# Patient Record
Sex: Female | Born: 1995 | Race: Black or African American | Hispanic: No | Marital: Single | Smoking: Current every day smoker
Health system: Southern US, Community
[De-identification: ages and names within clinical notes are randomized; demographics above are authoritative.]

## PROBLEM LIST (undated history)

## (undated) DIAGNOSIS — J45909 Unspecified asthma, uncomplicated: Secondary | ICD-10-CM

## (undated) DIAGNOSIS — E049 Nontoxic goiter, unspecified: Secondary | ICD-10-CM

## (undated) DIAGNOSIS — R7303 Prediabetes: Secondary | ICD-10-CM

## (undated) DIAGNOSIS — F209 Schizophrenia, unspecified: Secondary | ICD-10-CM

## (undated) DIAGNOSIS — T7840XA Allergy, unspecified, initial encounter: Secondary | ICD-10-CM

## (undated) DIAGNOSIS — E063 Autoimmune thyroiditis: Secondary | ICD-10-CM

## (undated) DIAGNOSIS — L83 Acanthosis nigricans: Secondary | ICD-10-CM

## (undated) DIAGNOSIS — E663 Overweight: Secondary | ICD-10-CM

## (undated) HISTORY — PX: MOUTH SURGERY: SHX715

## (undated) HISTORY — PX: NO PAST SURGERIES: SHX2092

## (undated) HISTORY — DX: Overweight: E66.3

## (undated) HISTORY — DX: Prediabetes: R73.03

## (undated) HISTORY — DX: Autoimmune thyroiditis: E06.3

## (undated) HISTORY — DX: Nontoxic goiter, unspecified: E04.9

## (undated) HISTORY — DX: Acanthosis nigricans: L83

---
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
## 2018-02-04 ENCOUNTER — Encounter (HOSPITAL_COMMUNITY): Payer: Self-pay | Admitting: Emergency Medicine

## 2018-02-04 ENCOUNTER — Other Ambulatory Visit: Payer: Self-pay

## 2018-02-04 ENCOUNTER — Ambulatory Visit (HOSPITAL_COMMUNITY)
Admission: EM | Admit: 2018-02-04 | Discharge: 2018-02-04 | Disposition: A | Discharge status: Home / Self Care | Payer: Medicaid Other | Attending: Family Medicine | Admitting: Family Medicine

## 2018-02-04 DIAGNOSIS — Z711 Person with feared health complaint in whom no diagnosis is made: Secondary | ICD-10-CM | POA: Diagnosis not present

## 2018-02-04 DIAGNOSIS — Z3202 Encounter for pregnancy test, result negative: Secondary | ICD-10-CM | POA: Diagnosis not present

## 2018-02-04 DIAGNOSIS — T192XXA Foreign body in vulva and vagina, initial encounter: Secondary | ICD-10-CM | POA: Insufficient documentation

## 2018-02-04 LAB — POCT URINALYSIS DIP (DEVICE)
Bilirubin Urine: NEGATIVE
Glucose, UA: NEGATIVE mg/dL
Hgb urine dipstick: NEGATIVE
Ketones, ur: NEGATIVE mg/dL
Leukocytes, UA: NEGATIVE
Nitrite: NEGATIVE
PH: 6.5 (ref 5.0–8.0)
Protein, ur: NEGATIVE mg/dL
Specific Gravity, Urine: 1.02 (ref 1.005–1.030)
Urobilinogen, UA: 0.2 mg/dL (ref 0.0–1.0)

## 2018-02-04 LAB — POCT PREGNANCY, URINE: PREG TEST UR: NEGATIVE

## 2018-02-04 NOTE — ED Triage Notes (Signed)
Patient says she feels like she may have left a tampon in vagina, patient says this based on how abdomen feels, not remembering leaving a tampon inserted.    Patient reports no nausea.  Reports vaginal discharge, denies vaginal odor.  Patient is difficult historian, non-specific, vague

## 2018-02-04 NOTE — ED Notes (Signed)
Instructed to put on a gown 

## 2018-02-04 NOTE — ED Provider Notes (Addendum)
[facility]    CSN: [id number] Arrival date & time: 02/04/18  1005     History   Chief Complaint Chief Complaint  Patient presents with  . Foreign Body in Vagina    HPI Heather Kramer is a 23 y.o. female.   23 year old female presents with concerns of foreign body in her cervix x1 year.  Patient states that she has intermittent "grumbling without the sound`" in her stomach and is concerned that she left a tampon in her approximately 1 year.  Patient denies any issues with her menstruation no pain with urination fevers abdominal pain or flank pain.  Patient about getting a pelvic exam at this time.  Pelvic exam performed shows no foreign object.  Patient is a poor historian and vague with her symptoms.  Patient states that she is concerned that the tampon may have moved into her stomach due to the length of time which it has been in her body.  Patient educated on anatomy.  Patient declines GI work-up at this time.       Past Medical History:  Diagnosis Date  . Acquired acanthosis nigricans   . Goiter   . Goiter   . Overweight(278.02)   . Pre-diabetes   . Thyroiditis, autoimmune     Patient Active Problem List   Diagnosis Date Noted  . Goiter   . Thyroiditis, autoimmune   . Acquired acanthosis nigricans   . 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 Goiter, unspecified 06/26/2010  . Pre-diabetes 06/26/2010    Past Surgical History:  Procedure Laterality Date  . NO PAST SURGERIES      OB History   No obstetric history on file.      Home Medications    Prior to Admission medications   Medication Sig Start Date End Date Taking? Authorizing Provider  albuterol (PROVENTIL) (2.5 MG/3ML) 0.083% nebulizer solution Take 2.5 mg by nebulization as needed.    Yes [provider]  fluticasone (FLONASE) 50 MCG/ACT nasal spray Place 2 sprays into the nose daily.   Yes [provider]  cetirizine (ZYRTEC) 10 MG tablet Take 10 mg by mouth  daily.    [provider]  traMADol (ULTRAM) 50 MG tablet Take 1 tablet (50 mg total) by mouth every 6 (six) hours as needed for moderate pain. 06/20/16   Fayrene Helper, PA-C  Vitamin D, Ergocalciferol, (DRISDOL) 50000 UNITS CAPS capsule Take 1 capsule (50,000 Units total) by mouth every 7 (seven) days. 02/21/14   Verneda Skill, FNP    Family History Family History  Problem Relation Age of Onset  . Obesity Mother   . Heart disease Father   . Cancer Maternal Grandmother   . Diabetes Maternal Grandfather     Social History Social History   Tobacco Use  . Smoking status: Never Smoker  . Smokeless tobacco: Never Used  Substance Use Topics  . Alcohol use: No  . Drug use: No     Allergies   Patient has no known allergies.   Review of Systems Review of Systems  Constitutional: Negative for chills and fever.  HENT: Negative for ear pain and sore throat.   Eyes: Negative for pain and visual disturbance.  Respiratory: Negative for cough and shortness of breath.   Cardiovascular: Negative for chest pain and palpitations.  Gastrointestinal: Negative for abdominal pain and vomiting.       Gurgling noises.  Genitourinary: Negative for dysuria and hematuria.  Musculoskeletal: Negative for arthralgias and back pain.  Skin: Negative for color change and rash.  Neurological: Negative for seizures and syncope.  All other systems reviewed and are negative.    Physical Exam Triage Vital Signs ED Triage Vitals  Enc Vitals Group     BP 02/04/18 1039 100/80     Pulse Rate 02/04/18 1039 89     Resp 02/04/18 1039 18     Temp 02/04/18 1039 98.6 F (37 C)     Temp Source 02/04/18 1039 Oral     SpO2 02/04/18 1039 100 %     Weight --      Height --      Head Circumference --      Peak Flow --      Pain Score 02/04/18 1036 6     Pain Loc --      Pain Edu? --      Excl. in GC? --    No data found.  Updated Vital Signs BP (!) 105/47 (BP Location: Left Arm)   Pulse 85    Temp 98.6 F (37 C) (Oral)   Resp 18   LMP 01/14/2018   SpO2 100%   Visual Acuity Right Eye Distance:   Left Eye Distance:   Bilateral Distance:    Right Eye Near:   Left Eye Near:    Bilateral Near:     Physical Exam Vitals signs and nursing note reviewed.  Constitutional:      Appearance: She is well-developed.  HENT:     Head: Normocephalic and atraumatic.  Eyes:     Conjunctiva/sclera: Conjunctivae normal.  Neck:     Musculoskeletal: Normal range of motion.  Pulmonary:     Effort: Pulmonary effort is normal.  Musculoskeletal: Normal range of motion.  Skin:    General: Skin is warm.  Neurological:     Mental Status: She is alert and oriented to person, place, and time.      UC Treatments / Results  Labs (all labs ordered are listed, but only abnormal results are displayed) Labs Reviewed  POCT URINALYSIS DIP (DEVICE)  POCT PREGNANCY, URINE    EKG None  Radiology No results found.  Procedures Procedures (including critical care time)  Medications Ordered in UC Medications - No data to display  Initial Impression / Assessment and Plan / UC Course  I have reviewed the triage vital signs and the nursing notes.  Pertinent labs & imaging results that were available during my care of the patient were reviewed by me and considered in my medical decision making (see chart for details).      Final Clinical Impressions(s) / UC Diagnoses   Final diagnoses:  None   Discharge Instructions   None    ED Prescriptions    None     Controlled Substance Prescriptions [place] Controlled Substance Registry consulted? Not Applicable   Alene MiresOmohundro, Wagner Tanzi C, NP 02/04/18 1112    Alene Miresmohundro, Rori Goar C, NP 02/04/18 1118    Alene Miresmohundro, Latavia Goga C, NP 02/04/18 1121

## 2018-02-05 ENCOUNTER — Other Ambulatory Visit: Payer: Self-pay

## 2018-02-05 ENCOUNTER — Emergency Department (HOSPITAL_COMMUNITY)
Admission: EM | Admit: 2018-02-05 | Discharge: 2018-02-06 | Disposition: A | Discharge status: Home / Self Care | Payer: Medicaid Other | Attending: Emergency Medicine | Admitting: Emergency Medicine

## 2018-02-05 ENCOUNTER — Encounter (HOSPITAL_COMMUNITY): Payer: Self-pay | Admitting: Emergency Medicine

## 2018-02-05 DIAGNOSIS — Z79899 Other long term (current) drug therapy: Secondary | ICD-10-CM | POA: Insufficient documentation

## 2018-02-05 DIAGNOSIS — F17200 Nicotine dependence, unspecified, uncomplicated: Secondary | ICD-10-CM | POA: Diagnosis not present

## 2018-02-05 DIAGNOSIS — F12259 Cannabis dependence with psychotic disorder, unspecified: Secondary | ICD-10-CM | POA: Insufficient documentation

## 2018-02-05 DIAGNOSIS — R440 Auditory hallucinations: Secondary | ICD-10-CM | POA: Diagnosis present

## 2018-02-05 DIAGNOSIS — F1210 Cannabis abuse, uncomplicated: Secondary | ICD-10-CM

## 2018-02-05 HISTORY — DX: Allergy, unspecified, initial encounter: T78.40XA

## 2018-02-05 LAB — COMPREHENSIVE METABOLIC PANEL
ALBUMIN: 3.9 g/dL (ref 3.5–5.0)
ALT: 14 U/L (ref 0–44)
ANION GAP: 8 (ref 5–15)
AST: 17 U/L (ref 15–41)
Alkaline Phosphatase: 44 U/L (ref 38–126)
BUN: 13 mg/dL (ref 6–20)
CHLORIDE: 108 mmol/L (ref 98–111)
CO2: 23 mmol/L (ref 22–32)
Calcium: 9 mg/dL (ref 8.9–10.3)
Creatinine, Ser: 0.95 mg/dL (ref 0.44–1.00)
GFR calc Af Amer: >60 mL/min (ref >60)
GFR calc non Af Amer: >60 mL/min (ref >60)
GLUCOSE: 103 mg/dL — AB (ref 70–99)
POTASSIUM: 4.1 mmol/L (ref 3.5–5.1)
SODIUM: 139 mmol/L (ref 135–145)
Total Bilirubin: 0.6 mg/dL (ref 0.3–1.2)
Total Protein: 6.9 g/dL (ref 6.5–8.1)

## 2018-02-05 LAB — CBC
HCT: 39.4 % (ref 36.0–46.0)
HEMOGLOBIN: 13.1 g/dL (ref 12.0–15.0)
MCH: 29.7 pg (ref 26.0–34.0)
MCHC: 33.2 g/dL (ref 30.0–36.0)
MCV: 89.3 fL (ref 80.0–100.0)
NRBC: 0 % (ref 0.0–0.2)
Platelets: 286 10*3/uL (ref 150–400)
RBC: 4.41 MIL/uL (ref 3.87–5.11)
RDW: 12.4 % (ref 11.5–15.5)
WBC: 6.9 10*3/uL (ref 4.0–10.5)

## 2018-02-05 LAB — I-STAT BETA HCG BLOOD, ED (MC, WL, AP ONLY): I-stat hCG, quantitative: <5 m[IU]/mL (ref <5)

## 2018-02-05 LAB — TSH: TSH: 0.433 u[IU]/mL (ref 0.350–4.500)

## 2018-02-05 LAB — SALICYLATE LEVEL: Salicylate Lvl: <7 mg/dL (ref 2.8–30.0)

## 2018-02-05 LAB — RAPID URINE DRUG SCREEN, HOSP PERFORMED
Amphetamines: NOT DETECTED
BENZODIAZEPINES: NOT DETECTED
Barbiturates: NOT DETECTED
COCAINE: NOT DETECTED
OPIATES: NOT DETECTED
TETRAHYDROCANNABINOL: POSITIVE — AB

## 2018-02-05 LAB — ACETAMINOPHEN LEVEL

## 2018-02-05 LAB — ETHANOL: Alcohol, Ethyl (B): <10 mg/dL (ref <10)

## 2018-02-05 NOTE — BH Assessment (Signed)
Tele Assessment Note   Patient Name: Heather Kramer MRN: [id number] Referring Physician: Dr. Rubin Payor  Location of Patient: [facility]  Location of Provider: [facility]  Heather Kramer is an 23 y.o., single female. Pt presented to [facility] voluntarily and unaccompanied. Pt reports that her aunt and aunt's boyfriend transported her to the hospital due to her experiencing auditory and visual hallucinations. Pt stated, "I hear the voice, because your phone off." Pt was the only historian and pt's communication was mostly incoherent. Pt did answer most questions, but did not answer them appropriately to match what was asked. Pt was vague about her symptoms but denied depression and anxiety. Pt denied hx of trauma. Pt denied current MH medications and therapy. Pt denied SI/HI. Pt reports smoking .5-2 grams of marijuana daily. Pt reports that her mother kicked out of her home today due to her marijauna use. Pt stated that she cannot return.   Pt reports being single and having no children. Pt reports being unemployed and having no income. Pt reports no major medical issues, but discussed cutting her finger at her previous job and feeling numbness in it when it gets cold. Pt reports no legal involvement or probation. Pt reports that her aunt is a support.   Pt oriented to person, place, time and situation. Pt presented alert, dressed appropriately and decently groomed. Pt spoke clearly, but incoherently. Pt did seem to be under the influence of any substances. Pt made decent eye contact. Pt did not answer questions appropriately, and often times went on tangents about unrelated topics. Pt presented silly, but calm and open to the assessment process. Pt presented with no impairments of remote or recent memory that could be detected. Pt seems to be responding to some king of internal stimuli as evidenced by her showing her teeth and head movements. Pt confirmed some kind of hallucination, but it was not  clear due to her incoherent speech.    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 Acquired acanthosis nigricans   . Allergies   . Goiter   . Goiter   . Overweight(278.02)   . Pre-diabetes   . Thyroiditis, autoimmune     Past Surgical History:  Procedure Laterality Date  . NO PAST SURGERIES      Family History:  Family History  Problem Relation Age of Onset  . Obesity Mother   . Heart disease Father   . Cancer Maternal Grandmother   . Diabetes Maternal Grandfather     Social History:  reports that she has been smoking. She has never used smokeless tobacco. She reports current alcohol use. She reports current drug use. Drug: Marijuana.  Additional Social History:  Alcohol / Drug Use Pain Medications: SEE MAR.  Prescriptions: Pt reports no mental health medications.  Over the Counter: SEE MAR.  History of alcohol / drug use?: Yes Substance #1 Name of Substance 1: Marijuana  1 - Age of First Use: 19 1 - Amount (size/oz): .5 grams to 1 gram  1 - Frequency: 2-3 Times Weekly  1 - Duration: 2.5-3 Years  1 - Last Use / Amount: 02/05/2018  CIWA: CIWA-Ar BP: 107/69 Pulse Rate: (!) 101 COWS:    Allergies: No Known Allergies  Home Medications: (Not in a hospital admission)   OB/GYN Status:  Patient's last menstrual period was 01/14/2018.  General Assessment Data Location of Assessment: [facility] ED  TTS Assessment: In system Is this a Tele or Face-to-Face Assessment?: Tele Assessment Is this an Initial Assessment or a Re-assessment for this encounter?: Initial Assessment Patient Accompanied by:: Other(Pt reports that her aunt and aunt's boyfriend are with her. ) Language Other than English: No Living Arrangements: Other (Comment)(PT reports living with her mother. ) What gender do you identify as?: Female Marital status: Single Maiden name: N/A Pregnancy  Status: Unknown(Pt stated that she is unsure. ) Living Arrangements: Parent Can pt return to current living arrangement?: No Admission Status: Voluntary Is patient capable of signing voluntary admission?: Yes Referral Source: Self/Family/Friend Insurance type: Medicaid   Medical Screening Exam [facility] Walk-in ONLY) Medical Exam completed: Yes  Crisis Care Plan Living Arrangements: Parent Legal Guardian: Other:(Self) Name of Psychiatrist: Denied Name of Therapist: Denied  Education Status Is patient currently in school?: No Is the patient employed, unemployed or receiving disability?: Unemployed  Risk to self with the past 6 months Suicidal Ideation: No Has patient been a risk to self within the past 6 months prior to admission? : No Suicidal Intent: No Has patient had any suicidal intent within the past 6 months prior to admission? : No Is patient at risk for suicide?: No Suicidal Plan?: No Has patient had any suicidal plan within the past 6 months prior to admission? : No Access to Means: No What has been your use of drugs/alcohol within the last 12 months?: Pt reports marijuana use.  Previous Attempts/Gestures: No How many times?: 0 Other Self Harm Risks: Denied Triggers for Past Attempts: None known Intentional Self Injurious Behavior: None Family Suicide History: No Recent stressful life event(s): Other (Comment), Conflict (Comment)(Pt reports being kicked out of her mother's home. ) Persecutory voices/beliefs?: No Depression: No Substance abuse history and/or treatment for substance abuse?: No Suicide prevention information given to non-admitted patients: Not applicable  Risk to Others within the past 6 months Homicidal Ideation: No Does patient have any lifetime risk of violence toward others beyond the six months prior to admission? : No Thoughts of Harm to Others: No Current Homicidal Intent: No Current Homicidal Plan: No Access to Homicidal Means: No Identified  Victim: Denied History of harm to others?: No Assessment of Violence: None Noted Violent Behavior Description: Denied Does patient have access to weapons?: No Criminal Charges Pending?: No Does patient have a court date: No Is patient on probation?: No  Psychosis Hallucinations: Auditory, Visual Delusions: None noted  Mental Status Report Appearance/Hygiene: Unremarkable, In scrubs Eye Contact: Good Motor Activity: Freedom of movement Speech: Incoherent Level of Consciousness: Alert Mood: Silly Affect: Appropriate to circumstance Anxiety Level: None Thought Processes: Circumstantial Judgement: Impaired Orientation: Person, Place, Time, Situation, Appropriate for developmental age Obsessive Compulsive Thoughts/Behaviors: None  Cognitive Functioning Concentration: Normal Memory: Recent Intact, Remote Intact Is patient IDD: No Insight: Poor Impulse Control: Fair Appetite: Good Have you had any weight changes? : No Change Sleep: No Change Total Hours of Sleep: 8 Vegetative Symptoms: None  ADLScreening [facility] Assessment Services) Patient's cognitive ability adequate to safely complete daily activities?: Yes Patient able to express need for assistance with ADLs?: Yes Independently performs ADLs?: Yes (appropriate for developmental age)  Prior Inpatient Therapy Prior Inpatient Therapy: No  Prior Outpatient Therapy Prior Outpatient Therapy: No Does patient have an ACCT team?: No Does patient have Intensive In-House Services?  : No Does patient have [facility] services? : No Does patient have P4CC services?: No  ADL Screening (condition at time of admission) Patient's cognitive ability adequate to safely complete  daily activities?: Yes Is the patient deaf or have difficulty hearing?: No Does the patient have difficulty seeing, even when wearing glasses/contacts?: No Does the patient have difficulty concentrating, remembering, or making decisions?: No Patient able to  express need for assistance with ADLs?: Yes Does the patient have difficulty dressing or bathing?: No Independently performs ADLs?: Yes (appropriate for developmental age) Does the patient have difficulty walking or climbing stairs?: No Weakness of Legs: None Weakness of Arms/Hands: None  Home Assistive Devices/Equipment Home Assistive Devices/Equipment: None  Therapy Consults (therapy consults require a physician order) PT Evaluation Needed: No OT Evalulation Needed: No SLP Evaluation Needed: No Abuse/Neglect Assessment (Assessment to be complete while patient is alone) Abuse/Neglect Assessment Can Be Completed: Yes Physical Abuse: Denies Verbal Abuse: Denies Sexual Abuse: Denies Exploitation of patient/patient's resources: Denies Self-Neglect: Denies Values / Beliefs Cultural Requests During Hospitalization: Other (comment)("I want to be around people that look like me and understand the things that I'm saying.") Spiritual Requests During Hospitalization: None Consults Spiritual Care Consult Needed: No Social Work Consult Needed: No Merchant navy officerAdvance Directives (For Healthcare) Does Patient Have a Medical Advance Directive?: No Would patient like information on creating a medical advance directive?: No - Patient declined          Disposition: Per Nira ConnJason Berry, NP; Pt to be observed overnight due to psychosis and evaluated by psychiatry in the AM. Selena BattenKim, [facility] informed [facility].  Disposition Initial Assessment Completed for this Encounter: Yes Patient referred to: [facility](AC, Kim informed of pt disposition. )  This service was provided via telemedicine using a 2-way, interactive audio and Immunologistvideo technology.  Names of all persons participating in this telemedicine service and their role in this encounter. Name: Benancio DeedsSadaya Signore Role: Patient   Name: Chesley NoonMiriam Isley Zinni  Role: Clinician   Name:  Role:   Name:  Role:    Chesley NoonMiriam Firmin [place], M.S., [facility], LCAS Triage Specialist [facility] 02/05/2018 9:38 PM

## 2018-02-05 NOTE — ED Notes (Signed)
Pt refused blood draw

## 2018-02-05 NOTE — ED Notes (Signed)
TSH drawn and sent down to lab

## 2018-02-05 NOTE — ED Triage Notes (Signed)
Pt states she doesn't know why she is here.  States her aunt and her aunt's boyfriend told her she was 22 and can't live at home anymore.  Spoke with aunt and aunt's boyfriend that states pt lives with her mother who is out of town.  They went to check on her.  Reports that pt is using marijuana and possibly other drugs and mother doesn't want her to stay at house anymore.  Also states she is hearing voices and talking to people that aren't there and they feel that she needs a pych eval.  Questioned pt about drug use and she was reluctant to report marijuana use.  Denies other drugs.  Reports auditory and visual hallucinations. Denies SI/HI.  Pt changed into paper scrubs, wanded by security, and belongings bagged and placed in locker by Gus, EMT.

## 2018-02-05 NOTE — ED Provider Notes (Cosign Needed)
[facility] EMERGENCY DEPARTMENT Provider Note   CSN: [id number] Arrival date & time: 02/05/18  2024     History   Chief Complaint Chief Complaint  Patient presents with  . Hallucinations  . Medical Clearance    HPI Eleyna A Thiem is a 23 y.o. female.  HPI  Heather Kramer is a 23 y.o. female with PMH of acanthosis nigricans, allergies, goiter, prediabetes and autoimmune thyroiditis who presents voluntarily to the ED as family had expressed concerns of the patient may be experiencing auditory and/or visual hallucinations.  She reports that she smokes marijuana frequently but denies other drug use.  She states that the voices she hears on always tell her to do things but they speak to her frequently.  Denies SI and HI.  Denies any recent falls or trauma.  Denies intent to harm her self at this time or history of self-harm.  States that she sleeps about 8 hours per night.  Past Medical History:  Diagnosis Date  . Acquired acanthosis nigricans   . Allergies   . Goiter   . Goiter   . Overweight(278.02)   . Pre-diabetes   . Thyroiditis, autoimmune     Patient Active Problem List   Diagnosis Date Noted  . Goiter   . Thyroiditis, autoimmune   . Acquired acanthosis nigricans   . 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 Goiter, unspecified 06/26/2010  . Pre-diabetes 06/26/2010    Past Surgical History:  Procedure Laterality Date  . NO PAST SURGERIES       OB History   No obstetric history on file.      Home Medications    Prior to Admission medications   Medication Sig Start Date End Date Taking? Authorizing Provider  albuterol (PROVENTIL) (2.5 MG/3ML) 0.083% nebulizer solution Take 2.5 mg by nebulization as needed.     [provider]  cetirizine (ZYRTEC) 10 MG tablet Take 10 mg by mouth daily.    [provider]  fluticasone (FLONASE) 50 MCG/ACT nasal spray Place 2 sprays into the nose daily.    [provider]    traMADol (ULTRAM) 50 MG tablet Take 1 tablet (50 mg total) by mouth every 6 (six) hours as needed for moderate pain. 06/20/16   Fayrene Helper, PA-C  Vitamin D, Ergocalciferol, (DRISDOL) 50000 UNITS CAPS capsule Take 1 capsule (50,000 Units total) by mouth every 7 (seven) days. 02/21/14   Verneda Skill, FNP    Family History Family History  Problem Relation Age of Onset  . Obesity Mother   . Heart disease Father   . Cancer Maternal Grandmother   . Diabetes Maternal Grandfather     Social History Social History   Tobacco Use  . Smoking status: Current Every Day Smoker  . Smokeless tobacco: Never Used  Substance Use Topics  . Alcohol use: Yes  . Drug use: Yes    Types: Marijuana     Allergies   Patient has no known allergies.   Review of Systems Review of Systems  Constitutional: Negative for chills and fever.  HENT: Negative for ear pain and sore throat.   Eyes: Negative for pain and visual disturbance.  Respiratory: Negative for cough and shortness of breath.   Cardiovascular: Negative for chest pain and palpitations.  Gastrointestinal: Negative for abdominal pain and vomiting.  Genitourinary: Negative for dysuria and hematuria.  Musculoskeletal: Negative for arthralgias and back pain.  Skin: Negative for color change and rash.  Neurological: Negative for seizures  and syncope.  Psychiatric/Behavioral: Positive for hallucinations. Negative for sleep disturbance and suicidal ideas. The patient is not nervous/anxious.   All other systems reviewed and are negative.    Physical Exam Updated Vital Signs BP 107/69 (BP Location: Right Arm)   Pulse (!) 101   Temp 98.7 F (37.1 C) (Oral)   Resp (!) 25   LMP 01/14/2018   SpO2 100%   Physical Exam Vitals signs and nursing note reviewed.  Constitutional:      General: She is not in acute distress.    Appearance: She is well-developed.  HENT:     Head: Normocephalic and atraumatic.  Eyes:     Conjunctiva/sclera:  Conjunctivae normal.  Neck:     Musculoskeletal: Neck supple.  Cardiovascular:     Rate and Rhythm: Normal rate and regular rhythm.     Heart sounds: No murmur.  Pulmonary:     Effort: Pulmonary effort is normal. No respiratory distress.     Breath sounds: Normal breath sounds.  Abdominal:     Palpations: Abdomen is soft.     Tenderness: There is no abdominal tenderness.  Skin:    General: Skin is warm and dry.  Neurological:     Mental Status: She is alert.  Psychiatric:        Attention and Perception: Attention normal.        Mood and Affect: Affect is blunt and flat.        Speech: Speech normal.        Behavior: Behavior is withdrawn.        Thought Content: Thought content is not paranoid or delusional.     Comments: Patient is very withdrawn.  Answers questions mostly yes or no and does not provide much more information.      ED Treatments / Results  Labs (all labs ordered are listed, but only abnormal results are displayed) Labs Reviewed  COMPREHENSIVE METABOLIC PANEL - Abnormal; Notable for the following components:      Result Value   Glucose, Bld 103 (*)    All other components within normal limits  ACETAMINOPHEN LEVEL - Abnormal; Notable for the following components:   Acetaminophen (Tylenol), Serum <10 (*)    All other components within normal limits  RAPID URINE DRUG SCREEN, HOSP PERFORMED - Abnormal; Notable for the following components:   Tetrahydrocannabinol POSITIVE (*)    All other components within normal limits  ETHANOL  SALICYLATE LEVEL  CBC  TSH  I-STAT BETA HCG BLOOD, ED ([facility], [facility], [facility] ONLY)    EKG None  Radiology No results found.  Procedures Procedures (including critical care time)  Medications Ordered in ED Medications - No data to display   Initial Impression / Assessment and Plan / ED Course  I have reviewed the triage vital signs and the nursing notes.  Pertinent labs & imaging results that were available during my care of  the patient were reviewed by me and considered in my medical decision making (see chart for details).     MDM:  Imaging: None indicated at this time.  ED Provider Interpretation of EKG: Indicated at this time.  Labs: TSH normal, hCG negative, CMP unremarkable, ethanol negative, salicylate negative, Tylenol negative, CBC normal, UDS positive for THC  On initial evaluation, patient appears stable. Afebrile and hemodynamically stable. Alert and oriented x4, pleasant, and cooperative.  Presents with hallucinations as detailed above.  On exam, patient has a very flat affect and is withdrawn.  She appears to be  responding to internal stimuli at times and does endorse auditory hallucinations but is unable to provide significant details regarding what they tell her.  Does not answer questions regarding visual hallucinations.  Denies insomnia.  Denies thoughts of hurting herself or anyone else.  Dorsal marijuana use as above.  Differential includes marijuana induced psychosis versus underlying psychiatric disorder.  Labs unremarkable and medically cleared at this time.  TTS evaluated patient and feels she is appropriate for observation overnight and reevaluation in the morning by psychiatry team.  Agree with this plan.  Appropriate orders placed and patient was resting comfortably.  She agreed with the plan to stay and had no intention of leaving.  Again denied thoughts of harming herself or anyone else.  The plan for this patient was discussed with Dr. Clayborne Dana who voiced agreement and who oversaw evaluation and treatment of this patient.   The patient was fully informed and involved with the history taking, evaluation, workup including labs/images, and plan. The patient's concerns and questions were addressed to the patient's satisfaction and she expressed agreement with the plan to remain in the ED and observe overnight with psychiatric evaluation in the morning.    Final Clinical Impressions(s) / ED  Diagnoses   Final diagnoses:  Marijuana abuse  Auditory hallucinations    ED Discharge Orders    None       Monta Maiorana, Sherryle Lis, MD 02/06/18 [phone]

## 2018-02-05 NOTE — ED Notes (Signed)
Per [facility] pt will be observed tonight and will see psychiatrist in the am for re-eval.

## 2018-02-05 NOTE — ED Notes (Signed)
Pt belongings placed in locker number 4 

## 2018-02-05 NOTE — Progress Notes (Signed)
Nurse, Albin Felling informed of pt disposition.

## 2018-02-05 NOTE — ED Notes (Addendum)
Pt's aunt is leaving-- Nkechinyere Ferderer- phone #[phone] Pt's mom is Shayne Alken- [phone]  Provided aunt with visiting hours for [facility] patients.

## 2018-02-06 ENCOUNTER — Other Ambulatory Visit: Payer: Self-pay

## 2018-02-06 NOTE — Consult Note (Signed)
Telepsych Consultation   Reason for Consult:  Hallucinations  Referring Physician:  EPD Location of Patient: 051c/051c Location of Provider: [facility]  Patient Identification: Heather Kramer MRN:  [id number] Principal Diagnosis: <principal problem not specified> Diagnosis:  Active Problems:   * No active hospital problems. *   Total Time spent with patient: 15 minutes  Subjective:   Heather Kramer is a 23 y.o. female.  Was seen via tele-assessment.  She is awake alert and oriented x3.  Patient validates the information that was provided in the below assessment however states she cannot recall why she was here.  She denies suicidal or homicidal ideations.  Denies auditory visual hallucinations.  Denies that she is followed by psychiatry/therapy.  Denies taking daily antidepressants or psychotropic medications.  Patient does report using marijuana.  States her aunt asked her to leave her residence on yesterday because she had a "gram."  Denies previous inpatient admissions.  Patient provided verbal permission to speak to her mother.  NP contacted Shayne Alken at [phone] mother recants information provided. Mother reports " I know she has been using other drugs, like codeine" State patient sometime talks to herself while using drugs.   Reports she is currently seeking different living arrangements for her. However states she will be to pick patient up in 1 hour.   Discussed providing additional substance abuse resources mother was agreeable to plan.  Case staffed with MD Lucianne Muss.  Recommend discharge patient to follow-up as needed.  Support encouragement reassurance was provided.  HPI:  Per counselor note: Heather Kramer is an 23 y.o., single female. Pt presented to [facility] voluntarily and unaccompanied. Pt reports that her aunt and aunt's boyfriend transported her to the hospital due to her experiencing auditory and visual hallucinations. Pt stated, "I hear the voice, because  your phone off." Pt was the only historian and pt's communication was mostly incoherent. Pt did answer most questions, but did not answer them appropriately to match what was asked. Pt was vague about her symptoms but denied depression and anxiety. Pt denied hx of trauma. Pt denied current MH medications and therapy. Pt denied SI/HI. Pt reports smoking .5-2 grams of marijuana daily. Pt reports that her mother kicked out of her home today due to her marijauna use. Pt stated that she cannot return.   Pt reports being single and having no children. Pt reports being unemployed and having no income. Pt reports no major medical issues, but discussed cutting her finger at her previous job and feeling numbness in it when it gets cold. Pt reports no legal involvement or probation. Pt reports that her aunt is a support.   Pt oriented to person, place, time and situation. Pt presented alert, dressed appropriately and decently groomed. Pt spoke clearly, but incoherently. Pt did seem to be under the influence of any substances. Pt made decent eye contact. Pt did not answer questions appropriately, and often times went on tangents about unrelated topics. Pt presented silly, but calm and open to the assessment process. Pt presented with no impairments of remote or recent memory that could be detected. Pt seems to be responding to some king of internal stimuli as evidenced by her showing her teeth and head movements. Pt confirmed some kind of hallucination, but it was not clear due to her   Past Psychiatric History:   Risk to Self: Suicidal Ideation: No Suicidal Intent: No Is patient at risk for suicide?: No Suicidal Plan?: No Access to Means: No  What has been your use of drugs/alcohol within the last 12 months?: Pt reports marijuana use.  How many times?: 0 Other Self Harm Risks: Denied Triggers for Past Attempts: None known Intentional Self Injurious Behavior: None Risk to Others: Homicidal Ideation:  No Thoughts of Harm to Others: No Current Homicidal Intent: No Current Homicidal Plan: No Access to Homicidal Means: No Identified Victim: Denied History of harm to others?: No Assessment of Violence: None Noted Violent Behavior Description: Denied Does patient have access to weapons?: No Criminal Charges Pending?: No Does patient have a court date: No Prior Inpatient Therapy: Prior Inpatient Therapy: No Prior Outpatient Therapy: Prior Outpatient Therapy: No Does patient have an ACCT team?: No Does patient have Intensive In-House Services?  : No Does patient have [facility] services? : No Does patient have P4CC services?: No  Past Medical History:  Past Medical History:  Diagnosis Date  . Acquired acanthosis nigricans   . Allergies   . Goiter   . Goiter   . Overweight(278.02)   . Pre-diabetes   . Thyroiditis, autoimmune     Past Surgical History:  Procedure Laterality Date  . NO PAST SURGERIES     Family History:  Family History  Problem Relation Age of Onset  . Obesity Mother   . Heart disease Father   . Cancer Maternal Grandmother   . Diabetes Maternal Grandfather    Family Psychiatric  History:  Social History:  Social History   Substance and Sexual Activity  Alcohol Use Yes     Social History   Substance and Sexual Activity  Drug Use Yes  . Types: Marijuana    Social History   Socioeconomic History  . Marital status: Single    Spouse name: Not on file  . Number of children: Not on file  . Years of education: Not on file  . Highest education level: Not on file  Occupational History  . Not on file  Social Needs  . Financial resource strain: Not on file  . Food insecurity:    Worry: Not on file    Inability: Not on file  . Transportation needs:    Medical: Not on file    Non-medical: Not on file  Tobacco Use  . Smoking status: Current Every Day Smoker  . Smokeless tobacco: Never Used  Substance and Sexual Activity  . Alcohol use: Yes  . Drug  use: Yes    Types: Marijuana  . Sexual activity: Not on file  Lifestyle  . Physical activity:    Days per week: Not on file    Minutes per session: Not on file  . Stress: Not on file  Relationships  . Social connections:    Talks on phone: Not on file    Gets together: Not on file    Attends religious service: Not on file    Active member of club or organization: Not on file    Attends meetings of clubs or organizations: Not on file    Relationship status: Not on file  Other Topics Concern  . Not on file  Social History Narrative   Lives with mother and brother. 9th grade at [employer]. Plays basketball.    Additional Social History:    Allergies:  No Known Allergies  Labs:  Results for orders placed or performed during the hospital encounter of 02/05/18 (from the past 48 hour(s))  Rapid urine drug screen (hospital performed)     Status: Abnormal   Collection Time:  02/05/18  8:41 PM  Result Value Ref Range   Opiates NONE DETECTED NONE DETECTED   Cocaine NONE DETECTED NONE DETECTED   Benzodiazepines NONE DETECTED NONE DETECTED   Amphetamines NONE DETECTED NONE DETECTED   Tetrahydrocannabinol POSITIVE (A) NONE DETECTED   Barbiturates NONE DETECTED NONE DETECTED    Comment: (NOTE) DRUG SCREEN FOR MEDICAL PURPOSES ONLY.  IF CONFIRMATION IS NEEDED FOR ANY PURPOSE, NOTIFY LAB WITHIN 5 DAYS. LOWEST DETECTABLE LIMITS FOR URINE DRUG SCREEN Drug Class                     Cutoff (ng/mL) Amphetamine and metabolites    1000 Barbiturate and metabolites    200 Benzodiazepine                 200 Tricyclics and metabolites     300 Opiates and metabolites        300 Cocaine and metabolites        300 THC                            50 Performed at [facility], [address] [place], [place] [address]   Comprehensive metabolic panel     Status: Abnormal   Collection Time: 02/05/18  8:52 PM  Result Value Ref Range   Sodium 139 135 - 145 mmol/L    Potassium 4.1 3.5 - 5.1 mmol/L   Chloride 108 98 - 111 mmol/L   CO2 23 22 - 32 mmol/L   Glucose, Bld 103 (H) 70 - 99 mg/dL   BUN 13 6 - 20 mg/dL   Creatinine, Ser 6.040.95 0.44 - 1.00 mg/dL   Calcium 9.0 8.9 - 54.010.3 mg/dL   Total Protein 6.9 6.5 - 8.1 g/dL   Albumin 3.9 3.5 - 5.0 g/dL   AST 17 15 - 41 U/L   ALT 14 0 - 44 U/L   Alkaline Phosphatase 44 38 - 126 U/L   Total Bilirubin 0.6 0.3 - 1.2 mg/dL   GFR calc non Af Amer >60 >60 mL/min   GFR calc Af Amer >60 >60 mL/min   Anion gap 8 5 - 15    Comment: Performed at [facility], [address] [place], [place] [address]  Ethanol     Status: None   Collection Time: 02/05/18  8:52 PM  Result Value Ref Range   Alcohol, Ethyl (B) <10 <10 mg/dL    Comment: (NOTE) Lowest detectable limit for serum alcohol is 10 mg/dL. For medical purposes only. Performed at [facility], [address] [place], [place] [address]   Salicylate level     Status: None   Collection Time: 02/05/18  8:52 PM  Result Value Ref Range   Salicylate Lvl <7.0 2.8 - 30.0 mg/dL    Comment: Performed at [facility], [address] [place], [place] [address]  Acetaminophen level     Status: Abnormal   Collection Time: 02/05/18  8:52 PM  Result Value Ref Range   Acetaminophen (Tylenol), Serum <10 (L) 10 - 30 ug/mL    Comment: (NOTE) Therapeutic concentrations vary significantly. A range of 10-30 ug/mL  may be an effective concentration for many patients. However, some  are best treated at concentrations outside of this range. Acetaminophen concentrations >150 ug/mL at 4 hours after ingestion  and >50 ug/mL at 12 hours after ingestion are often associated with  toxic reactions. Performed  at [facility], [address] [place], [place] [address]   cbc     Status: None   Collection Time: 02/05/18  8:52 PM  Result Value Ref Range   WBC 6.9 4.0 - 10.5 K/uL   RBC 4.41 3.87 - 5.11 MIL/uL   Hemoglobin 13.1 12.0 - 15.0 g/dL   HCT 06.239.4 37.636.0  - 28.346.0 %   MCV 89.3 80.0 - 100.0 fL   MCH 29.7 26.0 - 34.0 pg   MCHC 33.2 30.0 - 36.0 g/dL   RDW 15.112.4 76.111.5 - 60.715.5 %   Platelets 286 150 - 400 K/uL   nRBC 0.0 0.0 - 0.2 %    Comment: Performed at [facility], [address] [place], [place] [address]  I-Stat beta hCG blood, ED     Status: None   Collection Time: 02/05/18  9:11 PM  Result Value Ref Range   I-stat hCG, quantitative <5.0 <5 mIU/mL   Comment 3            Comment:   GEST. AGE      CONC.  (mIU/mL)   <=1 WEEK        5 - 50     2 WEEKS       50 - 500     3 WEEKS       100 - 10,000     4 WEEKS     1,000 - 30,000        FEMALE AND NON-PREGNANT FEMALE:     LESS THAN 5 mIU/mL   TSH     Status: None   Collection Time: 02/05/18 10:35 PM  Result Value Ref Range   TSH 0.433 0.350 - 4.500 uIU/mL    Comment: Performed by a 3rd Generation assay with a functional sensitivity of <=0.01 uIU/mL. Performed at [facility], [address] [place], [place] [address]     Medications:  No current facility-administered medications for this encounter.    Current Outpatient Medications  Medication Sig Dispense Refill  . albuterol (PROVENTIL) (2.5 MG/3ML) 0.083% nebulizer solution Take 2.5 mg by nebulization as needed.     . cetirizine (ZYRTEC) 10 MG tablet Take 10 mg by mouth daily.    . fluticasone (FLONASE) 50 MCG/ACT nasal spray Place 2 sprays into the nose daily.    . traMADol (ULTRAM) 50 MG tablet Take 1 tablet (50 mg total) by mouth every 6 (six) hours as needed for moderate pain. 6 tablet 0  . Vitamin D, Ergocalciferol, (DRISDOL) 50000 UNITS CAPS capsule Take 1 capsule (50,000 Units total) by mouth every 7 (seven) days. 12 capsule 0    Musculoskeletal: Strength & Muscle Tone:  Gait & Station:  Patient leans:  Psychiatric Specialty Exam: Physical Exam  Vitals reviewed. Constitutional: She appears well-developed.  Neurological: She is alert.  Psychiatric: She has a normal mood and affect. Her behavior is  normal.    Review of Systems  Psychiatric/Behavioral: Negative for depression and suicidal ideas. The patient is not nervous/anxious.   All other systems reviewed and are negative.   Blood pressure 106/66, pulse 85, temperature 98.7 F (37.1 C), temperature source Oral, resp. rate 18, last menstrual period 01/14/2018, SpO2 96 %.There is no height or weight on file to calculate BMI.  General Appearance: Casual paper scrubs   Eye Contact:  Good  Speech:  Clear and Coherent  Volume:  Normal  Mood:  flat but pleasent   Affect:  Congruent  Thought  Process:  Coherent  Orientation:  Full (Time, Place, and Person)  Thought Content:  WDL  Suicidal Thoughts:  No  Homicidal Thoughts:  No  Memory:  Immediate;   Fair Recent;   Fair Remote;   Fair  Judgement:  Fair  Insight:  Fair  Psychomotor Activity:    Concentration:  Concentration: Fair  Recall:  [employer] of Knowledge:  Fair  Language:  Fair  Akathisia:  No  Handed:  Right  AIMS (if indicated):     Assets:  Communication Skills Desire for Improvement Social Support  ADL's:  Intact  Cognition:  WNL  Sleep:      CSW to provide additional resources for substance abuse resources. Provider Eber Jones made aware of discharge disposition recommendation  Disposition: No evidence of imminent risk to self or others at present.   Patient does not meet criteria for psychiatric inpatient admission. Supportive therapy provided about ongoing stressors. Refer to IOP. Discussed crisis plan, support from social network, calling 911, coming to the Emergency Department, and calling Suicide Hotline.  This service was provided via telemedicine using a 2-way, interactive audio and video technology.  Names of all persons participating in this telemedicine service and their role in this encounter. Name: Merrily Klammer  Role: patient   Name: T. Role: NP          Oneta Rack, NP 02/06/2018 9:52 AM

## 2018-02-06 NOTE — ED Notes (Signed)
Breakfast tray ordered 

## 2018-02-06 NOTE — ED Provider Notes (Addendum)
  Physical Exam  BP 106/66   Pulse 85   Temp 98.7 F (37.1 C) (Oral)   Resp 18   LMP 01/14/2018   SpO2 96%   Physical Exam Constitutional:      General: She is not in acute distress.    Appearance: Normal appearance.  Neurological:     Mental Status: She is alert.  Psychiatric:        Behavior: Behavior normal.     Comments: Awake, sitting up in bed. NAD.      MDM   0815: Pt noted to be awake, in NAD eating breakfast. [facility] and recommended overnight obs and AM psych eval which is pending. Labs reviewed and unremarkable. VSS. Pending psych eval recommendation to determine disposition.   1050: [facility] pt deemed appropriate for discharge.  Discussed with pt and pt's mom who are agreeable. Will dc at this time. VS WNL.     Liberty HandyGibbons, Saw Mendenhall J, PA-C 02/06/18 1051    Alvira MondaySchlossman, Erin, MD 02/07/18 2119

## 2018-02-06 NOTE — ED Notes (Signed)
Declined W/C at D/C and was escorted to lobby by RN. 

## 2018-02-06 NOTE — Discharge Instructions (Signed)
See resources given to you by Wandra Mannan (behavioral health)

## 2018-04-25 ENCOUNTER — Other Ambulatory Visit: Payer: Self-pay

## 2018-04-25 ENCOUNTER — Emergency Department (HOSPITAL_COMMUNITY)
Admission: EM | Admit: 2018-04-25 | Discharge: 2018-04-25 | Disposition: A | Discharge status: Home / Self Care | Payer: Medicaid Other | Attending: Emergency Medicine | Admitting: Emergency Medicine

## 2018-04-25 ENCOUNTER — Encounter (HOSPITAL_COMMUNITY): Payer: Self-pay

## 2018-04-25 DIAGNOSIS — R7303 Prediabetes: Secondary | ICD-10-CM | POA: Diagnosis not present

## 2018-04-25 DIAGNOSIS — Z0489 Encounter for examination and observation for other specified reasons: Secondary | ICD-10-CM | POA: Diagnosis present

## 2018-04-25 DIAGNOSIS — F1220 Cannabis dependence, uncomplicated: Secondary | ICD-10-CM | POA: Insufficient documentation

## 2018-04-25 DIAGNOSIS — F17210 Nicotine dependence, cigarettes, uncomplicated: Secondary | ICD-10-CM | POA: Diagnosis not present

## 2018-04-25 DIAGNOSIS — Z79899 Other long term (current) drug therapy: Secondary | ICD-10-CM | POA: Diagnosis not present

## 2018-04-25 DIAGNOSIS — Z008 Encounter for other general examination: Secondary | ICD-10-CM

## 2018-04-25 LAB — BASIC METABOLIC PANEL
Anion gap: 11 (ref 5–15)
BUN: 10 mg/dL (ref 6–20)
CO2: 23 mmol/L (ref 22–32)
Calcium: 9.5 mg/dL (ref 8.9–10.3)
Chloride: 105 mmol/L (ref 98–111)
Creatinine, Ser: 0.92 mg/dL (ref 0.44–1.00)
GFR calc Af Amer: >60 mL/min (ref >60)
GFR calc non Af Amer: >60 mL/min (ref >60)
Glucose, Bld: 76 mg/dL (ref 70–99)
Potassium: 4.4 mmol/L (ref 3.5–5.1)
Sodium: 139 mmol/L (ref 135–145)

## 2018-04-25 LAB — CBC WITH DIFFERENTIAL/PLATELET
Abs Immature Granulocytes: 0.02 10*3/uL (ref 0.00–0.07)
Basophils Absolute: 0 10*3/uL (ref 0.0–0.1)
Basophils Relative: 1 %
Eosinophils Absolute: 0.1 10*3/uL (ref 0.0–0.5)
Eosinophils Relative: 1 %
HCT: 39.7 % (ref 36.0–46.0)
Hemoglobin: 13.3 g/dL (ref 12.0–15.0)
Immature Granulocytes: 0 %
Lymphocytes Relative: 30 %
Lymphs Abs: 2.1 10*3/uL (ref 0.7–4.0)
MCH: 30.3 pg (ref 26.0–34.0)
MCHC: 33.5 g/dL (ref 30.0–36.0)
MCV: 90.4 fL (ref 80.0–100.0)
Monocytes Absolute: 0.5 10*3/uL (ref 0.1–1.0)
Monocytes Relative: 7 %
Neutro Abs: 4.2 10*3/uL (ref 1.7–7.7)
Neutrophils Relative %: 61 %
Platelets: 282 10*3/uL (ref 150–400)
RBC: 4.39 MIL/uL (ref 3.87–5.11)
RDW: 12.2 % (ref 11.5–15.5)
WBC: 6.9 10*3/uL (ref 4.0–10.5)
nRBC: 0 % (ref 0.0–0.2)

## 2018-04-25 LAB — RAPID URINE DRUG SCREEN, HOSP PERFORMED
Amphetamines: NOT DETECTED
Barbiturates: NOT DETECTED
Benzodiazepines: NOT DETECTED
Cocaine: NOT DETECTED
Opiates: NOT DETECTED
Tetrahydrocannabinol: POSITIVE — AB

## 2018-04-25 LAB — ETHANOL: Alcohol, Ethyl (B): <10 mg/dL (ref <10)

## 2018-04-25 LAB — PREGNANCY, URINE: Preg Test, Ur: NEGATIVE
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
## 2018-04-25 NOTE — ED Notes (Signed)
Pt completed TTS.  

## 2018-04-25 NOTE — ED Notes (Signed)
Got patient on the monitor did vitals patient is resting with nurse at bedside

## 2018-04-25 NOTE — ED Triage Notes (Signed)
Pt reports she was sent here by her mom for "behavioral health" help. Pt reports her mom wanted her sent here for evaluation. Pt denies any thoughts of wanting to hurt herself or anyone else at this time. Pt reports that her mom "thinks she wants to hurt herself," but patient reports she does not want to hurt herself.

## 2018-04-25 NOTE — ED Notes (Signed)
Pt wanded by security. 

## 2018-04-25 NOTE — Progress Notes (Signed)
CSW spoke with Marisue Ivan RN regarding pt disposition: pt will be psych cleared to follow up with substance abuse treatment.  List of substance abuse resources faxed. Garner Nash, MSW, LCSW Clinical Social Worker 04/25/2018 2:47 PM

## 2018-04-25 NOTE — Discharge Instructions (Signed)
You were seen in the ER by behavioral health.  Follow up with primary care within 3 days.  Return to the ER for thoughts of self harm, hurting others, hallucinations, or any other concerns.

## 2018-04-25 NOTE — ED Provider Notes (Signed)
[facility] EMERGENCY DEPARTMENT Provider Note   CSN: [id number] Arrival date & time: 04/25/18  1059    History   Chief Complaint No chief complaint on file.   HPI Marquis A Nassiri is a 23 y.o. female.     HPI Patient presents to the emergency department after being told by family members she needs to come be evaluated for mental health issues.  The patient states that she is not suicidal.  She states that her aunt is going to told her to come be evaluated.  The patient states that she did not make the comment that she would rather hurt herself and hurt her family.  The patient states she has no other issues at this time. Past Medical History:  Diagnosis Date  . Acquired acanthosis nigricans   . Allergies   . Goiter   . Goiter   . Overweight(278.02)   . Pre-diabetes   . Thyroiditis, autoimmune     Patient Active Problem List   Diagnosis Date Noted  . Goiter   . Thyroiditis, autoimmune   . Acquired acanthosis nigricans   . 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 Goiter, unspecified 06/26/2010  . Pre-diabetes 06/26/2010    Past Surgical History:  Procedure Laterality Date  . NO PAST SURGERIES       OB History   No obstetric history on file.      Home Medications    Prior to Admission medications   Medication Sig Start Date End Date Taking? Authorizing Provider  albuterol (PROVENTIL) (2.5 MG/3ML) 0.083% nebulizer solution Take 2.5 mg by nebulization as needed.     [provider]  cetirizine (ZYRTEC) 10 MG tablet Take 10 mg by mouth daily.    [provider]  fluticasone (FLONASE) 50 MCG/ACT nasal spray Place 2 sprays into the nose daily.    [provider]  traMADol (ULTRAM) 50 MG tablet Take 1 tablet (50 mg total) by mouth every 6 (six) hours as needed for moderate pain. 06/20/16   Fayrene Helper, PA-C  Vitamin D, Ergocalciferol, (DRISDOL) 50000 UNITS CAPS capsule Take 1 capsule (50,000 Units total) by mouth every  7 (seven) days. 02/21/14   Verneda Skill, FNP    Family History Family History  Problem Relation Age of Onset  . Obesity Mother   . Heart disease Father   . Cancer Maternal Grandmother   . Diabetes Maternal Grandfather     Social History Social History   Tobacco Use  . Smoking status: Current Every Day Smoker    Types: Cigarettes  . Smokeless tobacco: Never Used  Substance Use Topics  . Alcohol use: Yes  . Drug use: Yes    Types: Marijuana     Allergies   Patient has no known allergies.   Review of Systems Review of Systems  All other systems negative except as documented in the HPI. All pertinent positives and negatives as reviewed in the HPI. Physical Exam Updated Vital Signs BP 127/80 (BP Location: Right Arm)   Pulse 67   Temp 97.7 F (36.5 C) (Oral)   Resp 20   Ht 6' (1.829 m)   Wt 65.8 kg   SpO2 99%   BMI 19.67 kg/m   Physical Exam Vitals signs and nursing note reviewed.  Constitutional:      General: She is not in acute distress.    Appearance: She is well-developed.  HENT:     Head: Normocephalic and atraumatic.  Eyes:  Pupils: Pupils are equal, round, and reactive to light.  Neck:     Musculoskeletal: Normal range of motion and neck supple.  Cardiovascular:     Rate and Rhythm: Normal rate and regular rhythm.     Heart sounds: Normal heart sounds. No murmur. No friction rub. No gallop.   Pulmonary:     Effort: Pulmonary effort is normal. No respiratory distress.     Breath sounds: Normal breath sounds. No wheezing.  Skin:    General: Skin is warm and dry.     Capillary Refill: Capillary refill takes less than 2 seconds.     Findings: No erythema or rash.  Neurological:     Mental Status: She is alert and oriented to person, place, and time.     Motor: No abnormal muscle tone.     Coordination: Coordination normal.  Psychiatric:        Mood and Affect: Mood normal.        Behavior: Behavior normal.      ED Treatments /  Results  Labs (all labs ordered are listed, but only abnormal results are displayed) Labs Reviewed - No data to display  EKG None  Radiology No results found.  Procedures Procedures (including critical care time)  Medications Ordered in ED Medications - No data to display   Initial Impression / Assessment and Plan / ED Course  I have reviewed the triage vital signs and the nursing notes.  Pertinent labs & imaging results that were available during my care of the patient were reviewed by me and considered in my medical decision making (see chart for details).        Patient is a very vague historian but I do feel she needs TTS assessment to further evaluate to ensure there is no issues that need to be addressed at this time.  Final Clinical Impressions(s) / ED Diagnoses   Final diagnoses:  None    ED Discharge Orders    None       Charlestine NightLawyer, Tiffanni Scarfo, PA-C 04/25/18 1534    Sabas SousBero, Michael M, MD 04/26/18 [phone]

## 2018-04-25 NOTE — ED Provider Notes (Signed)
16:00: Assumed care of patient at change of shift from [facility] PA-C pending TTS recommendations.   See prior provider note for full H&P. Briefly patient made states that she would rather hurt herself than her family, there was concern regarding her mental health. No active SI/HI or plans.   Per [facility]- discharge home w/ resources. Patient agreeable.    Desmond Lope 04/25/18 1654    Wynetta Fines, MD 04/25/18 2333

## 2018-04-25 NOTE — ED Notes (Signed)
Pt belongings placed in locker 2 

## 2018-04-25 NOTE — ED Notes (Signed)
Spoke with BH. Outpatient drug rehab resources will be provided. They will fax over to give to pt.

## 2018-04-25 NOTE — ED Notes (Signed)
Pt belongings returned. Discharge paperwork reviewed with pt.

## 2018-04-25 NOTE — ED Notes (Signed)
This Rn spoke with Heather Kramer about situation. Heather Kramer stated that she was living with her mom and her Aunt lost her job so she had to move in as well. Her aunt and her mom feel she needs to get a better attitude, which patient agrees with. The patient said she needs to g oto a rehab or something for like three months for smoking weed. Heather Kramer denies SI/HI at this time  Heather Kramer is dressed in purple scrubs and is corroborative

## 2018-04-25 NOTE — ED Notes (Signed)
TTS set up for pt 

## 2018-07-29 ENCOUNTER — Other Ambulatory Visit: Payer: Self-pay

## 2018-07-29 ENCOUNTER — Ambulatory Visit (HOSPITAL_COMMUNITY)
Admission: EM | Admit: 2018-07-29 | Discharge: 2018-07-29 | Disposition: A | Discharge status: Home / Self Care | Payer: Medicaid Other | Attending: Family Medicine | Admitting: Family Medicine

## 2018-07-29 DIAGNOSIS — F17210 Nicotine dependence, cigarettes, uncomplicated: Secondary | ICD-10-CM | POA: Insufficient documentation

## 2018-07-29 DIAGNOSIS — Z1159 Encounter for screening for other viral diseases: Secondary | ICD-10-CM | POA: Diagnosis not present

## 2018-07-29 DIAGNOSIS — J039 Acute tonsillitis, unspecified: Secondary | ICD-10-CM

## 2018-07-29 MED ORDER — AMOXICILLIN 875 MG PO TABS: 875.0000 mg | ORAL_TABLET | Freq: Two times a day (BID) | ORAL | 0 refills | Status: AC

## 2018-07-29 NOTE — ED Provider Notes (Signed)
[place]   [id number] 07/29/18 Arrival Time: 4097  ASSESSMENT & PLAN:  1. Tonsillitis     No signs of peritonsillar abscess. Observe. Discussed.  Begin and complete: Meds ordered this encounter  Medications  . amoxicillin (AMOXIL) 875 MG tablet    Sig: Take 1 tablet (875 mg total) by mouth 2 (two) times daily for 10 days.    Dispense:  20 tablet    Refill:  0   Requests: Labs Reviewed  NOVEL CORONAVIRUS, NAA (HOSPITAL ORDER, SEND-OUT TO REF LAB)   OTC analgesics and throat care as needed  Instructed to finish full 10 day course of antibiotics. Will follow up if not showing significant improvement over the next 24-48 hours, sooner if needed.    Discharge Instructions      You may use over the counter ibuprofen or acetaminophen as needed.   For a sore throat, over the counter products such as [employer] Peroxyl Mouth Sore Rinse or Chloraseptic Sore Throat Spray may provide some temporary relief.    Reviewed expectations re: course of current medical issues. Questions answered. Outlined signs and symptoms indicating need for more acute intervention. Patient verbalized understanding. After Visit Summary given.   SUBJECTIVE:  Heather Kramer is a 23 y.o. female who reports a sore throat. Describes as "feeling swollen"; L-sided; pain with swallowing. Onset abrupt beginning 1-2 days ago. No specific worsening. No respiratory symptoms. Normal PO intake but reports discomfort with swallowing. Fever reported: no. No neck pain or swelling. No associated n/v/abdominal symptoms. Sick contacts: none reported. Requests COVID-19 testing. OTC treatment: Tylenol with mild help.  ROS: As per HPI. All other systems negative.    OBJECTIVE:  Vitals:   07/29/18 1025  BP: 114/85  Pulse: 78  Resp: 16  Temp: 98.3 F (36.8 C)  TempSrc: Oral  SpO2: 99%     General appearance: alert; no distress HEENT: throat with tonsillar hypertrophy, moderate erythema and exudates  present on the left; uvula midline: yes Neck: supple with FROM; small bilateral cervical LAD (R>L) that are tender to palpation CV: RRR Lungs: clear to auscultation bilaterally Abd: soft; non-tender Skin: reveals no rash; warm and dry Psychological: alert and cooperative; normal mood and affect  Allergies  Allergen Reactions  . Peanut-Containing Drug Products Anaphylaxis    Past Medical History:  Diagnosis Date  . Acquired acanthosis nigricans   . Allergies   . Goiter   . Goiter   . Overweight(278.02)   . Pre-diabetes   . Thyroiditis, autoimmune    Social History   Socioeconomic History  . Marital status: Single    Spouse name: Not on file  . Number of children: Not on file  . Years of education: Not on file  . Highest education level: Not on file  Occupational History  . Not on file  Social Needs  . Financial resource strain: Not on file  . Food insecurity    Worry: Not on file    Inability: Not on file  . Transportation needs    Medical: Not on file    Non-medical: Not on file  Tobacco Use  . Smoking status: Current Every Day Smoker    Types: Cigarettes  . Smokeless tobacco: Never Used  Substance and Sexual Activity  . Alcohol use: Yes  . Drug use: Yes    Types: Marijuana  . Sexual activity: Not on file  Lifestyle  . Physical activity    Days per week: Not on file    Minutes per  session: Not on file  . Stress: Not on file  Relationships  . Social Musicianconnections    Talks on phone: Not on file    Gets together: Not on file    Attends religious service: Not on file    Active member of club or organization: Not on file    Attends meetings of clubs or organizations: Not on file    Relationship status: Not on file  . Intimate partner violence    Fear of current or ex partner: Not on file    Emotionally abused: Not on file    Physically abused: Not on file    Forced sexual activity: Not on file  Other Topics Concern  . Not on file  Social History  Narrative   Lives with mother and brother. 9th grade at [employer]. Plays basketball.    Family History  Problem Relation Age of Onset  . Obesity Mother   . Heart disease Father   . Cancer Maternal Grandmother   . Diabetes Maternal Glynda JaegerGrandfather           Hanifah Royse, MD 07/29/18 1046

## 2018-07-29 NOTE — Discharge Instructions (Addendum)
You may use over the counter ibuprofen or acetaminophen as needed.  °For a sore throat, over the counter products such as Colgate Peroxyl Mouth Sore Rinse or Chloraseptic Sore Throat Spray may provide some temporary relief. ° ° ° ° °

## 2018-07-29 NOTE — ED Triage Notes (Addendum)
Per pt she gets tonsil stones and has one on the left side. Pt said very hard to swallow and eat. No fevers no chills, some cough and abdominal pain.

## 2018-07-31 LAB — NOVEL CORONAVIRUS, NAA (HOSP ORDER, SEND-OUT TO REF LAB; TAT 18-24 HRS): SARS-CoV-2, NAA: NOT DETECTED

## 2018-08-04 ENCOUNTER — Other Ambulatory Visit: Payer: Self-pay

## 2018-08-04 ENCOUNTER — Ambulatory Visit (HOSPITAL_COMMUNITY)
Admission: EM | Admit: 2018-08-04 | Discharge: 2018-08-04 | Disposition: A | Discharge status: Home / Self Care | Payer: Medicaid Other | Attending: Family Medicine | Admitting: Family Medicine

## 2018-08-04 NOTE — ED Notes (Addendum)
Pt states she only wants her test results, test results given, work note to return to work given. Pt states she does not want to see a provider. Pt also concerned about her dx of "sore throat". Pt informed her actual diagnosis was Tonsilitis when she visited on the 18th. Pt is still taking amoxicillin, states she is feeling much better. No need to bill as a visit.

## 2018-09-08 ENCOUNTER — Encounter (HOSPITAL_COMMUNITY): Payer: Self-pay | Admitting: Emergency Medicine

## 2018-09-08 ENCOUNTER — Other Ambulatory Visit: Payer: Self-pay

## 2018-09-08 ENCOUNTER — Ambulatory Visit (HOSPITAL_COMMUNITY)
Admission: EM | Admit: 2018-09-08 | Discharge: 2018-09-08 | Disposition: A | Discharge status: Home / Self Care | Payer: Medicaid Other

## 2018-09-08 DIAGNOSIS — Z0289 Encounter for other administrative examinations: Secondary | ICD-10-CM

## 2018-09-08 DIAGNOSIS — Z711 Person with feared health complaint in whom no diagnosis is made: Secondary | ICD-10-CM | POA: Diagnosis not present

## 2018-09-08 NOTE — Discharge Instructions (Addendum)
You may return to work at your next shift.

## 2018-09-08 NOTE — ED Provider Notes (Signed)
[facility]    CSN: [id number] Arrival date & time: 09/08/18  1836      History   Chief Complaint Chief Complaint  Patient presents with  . Letter for School/Work    HPI Heather Kramer is a 23 y.o. female.   Patient states she overslept today and then called out from work.  She states she needs a note for work today.  She denies fever, chills, congestion, sore throat, ear pain, cough, shortness of breath, abdominal pain, vomiting, diarrhea, or other symptoms.  She states she feels well and has no concerns.    The history is provided by the patient.    Past Medical History:  Diagnosis Date  . Acquired acanthosis nigricans   . Allergies   . Goiter   . Goiter   . Overweight(278.02)   . Pre-diabetes   . Thyroiditis, autoimmune     Patient Active Problem List   Diagnosis Date Noted  . Goiter   . Thyroiditis, autoimmune   . Acquired acanthosis nigricans   . 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 Goiter, unspecified 06/26/2010  . Pre-diabetes 06/26/2010    Past Surgical History:  Procedure Laterality Date  . NO PAST SURGERIES      OB History   No obstetric history on file.      Home Medications    Prior to Admission medications   Medication Sig Start Date End Date Taking? Authorizing Provider  cetirizine (ZYRTEC) 10 MG tablet Take 10 mg by mouth daily.    [provider]  PROAIR HFA 108 [phone](90 Base) MCG/ACT inhaler Inhale 2 puffs into the lungs every 4 (four) hours as needed for shortness of breath or wheezing. 11/04/17   [provider]  Vitamin D, Ergocalciferol, (DRISDOL) 50000 UNITS CAPS capsule Take 1 capsule (50,000 Units total) by mouth every 7 (seven) days. Patient not taking: Reported on 04/25/2018 02/21/14   Verneda SkillHacker, Caroline T, FNP    Family History Family History  Problem Relation Age of Onset  . Obesity Mother   . Heart disease Father   . Cancer Maternal Grandmother   . Diabetes Maternal Grandfather      Social History Social History   Tobacco Use  . Smoking status: Current Every Day Smoker    Types: Cigarettes  . Smokeless tobacco: Never Used  Substance Use Topics  . Alcohol use: Yes  . Drug use: Yes    Types: Marijuana     Allergies   Peanut-containing drug products   Review of Systems Review of Systems  Constitutional: Negative for chills and fever.  HENT: Negative for congestion, ear pain, rhinorrhea and sore throat.   Eyes: Negative for pain and visual disturbance.  Respiratory: Negative for cough and shortness of breath.   Cardiovascular: Negative for chest pain and palpitations.  Gastrointestinal: Negative for abdominal pain, diarrhea and vomiting.  Genitourinary: Negative for dysuria and hematuria.  Musculoskeletal: Negative for arthralgias and back pain.  Skin: Negative for color change and rash.  Neurological: Negative for seizures and syncope.  All other systems reviewed and are negative.    Physical Exam Triage Vital Signs ED Triage Vitals  Enc Vitals Group     BP 09/08/18 1850 100/66     Pulse Rate 09/08/18 1850 86     Resp 09/08/18 1850 18     Temp 09/08/18 1850 98.1 F (36.7 C)     Temp Source 09/08/18 1850 Temporal     SpO2 09/08/18 1850 98 %  Weight --      Height --      Head Circumference --      Peak Flow --      Pain Score 09/08/18 1851 0     Pain Loc --      Pain Edu? --      Excl. in [place]? --    No data found.  Updated Vital Signs BP 100/66 (BP Location: Right Arm)   Pulse 86   Temp 98.1 F (36.7 C) (Temporal)   Resp 18   SpO2 98%   Visual Acuity Right Eye Distance:   Left Eye Distance:   Bilateral Distance:    Right Eye Near:   Left Eye Near:    Bilateral Near:     Physical Exam Vitals signs and nursing note reviewed.  Constitutional:      General: She is not in acute distress.    Appearance: She is well-developed.  HENT:     Head: Normocephalic and atraumatic.     Right Ear: Tympanic membrane normal.     Left  Ear: Tympanic membrane normal.     Nose: Nose normal.     Mouth/Throat:     Mouth: Mucous membranes are moist.     Pharynx: Oropharynx is clear.  Eyes:     Conjunctiva/sclera: Conjunctivae normal.  Neck:     Musculoskeletal: Neck supple.  Cardiovascular:     Rate and Rhythm: Normal rate and regular rhythm.     Heart sounds: No murmur.  Pulmonary:     Effort: Pulmonary effort is normal. No respiratory distress.     Breath sounds: Normal breath sounds.  Abdominal:     General: Bowel sounds are normal.     Palpations: Abdomen is soft.     Tenderness: There is no abdominal tenderness. There is no guarding or rebound.  Skin:    General: Skin is warm and dry.     Findings: No rash.  Neurological:     Mental Status: She is alert.      UC Treatments / Results  Labs (all labs ordered are listed, but only abnormal results are displayed) Labs Reviewed - No data to display  EKG   Radiology No results found.  Procedures Procedures (including critical care time)  Medications Ordered in UC Medications - No data to display  Initial Impression / Assessment and Plan / UC Course  I have reviewed the triage vital signs and the nursing notes.  Pertinent labs & imaging results that were available during my care of the patient were reviewed by me and considered in my medical decision making (see chart for details).    Worried well.  Patient is well-appearing and her exam is unremarkable.  Instructed patient that she can return to work at her next shift.     Final Clinical Impressions(s) / UC Diagnoses   Final diagnoses:  Physically well but worried     Discharge Instructions     You may return to work at your next shift.        ED Prescriptions    None     Controlled Substance Prescriptions [place] Controlled Substance Registry consulted? Not Applicable   Sharion Balloon, NP 09/08/18 1904

## 2018-09-08 NOTE — ED Triage Notes (Signed)
Pt here for note to return to work today; denies complaint

## 2018-12-12 ENCOUNTER — Other Ambulatory Visit: Payer: Self-pay

## 2018-12-12 ENCOUNTER — Ambulatory Visit (HOSPITAL_COMMUNITY)
Admission: EM | Admit: 2018-12-12 | Discharge: 2018-12-12 | Disposition: A | Discharge status: Home / Self Care | Payer: Medicaid Other | Attending: Family Medicine | Admitting: Family Medicine

## 2018-12-12 ENCOUNTER — Encounter (HOSPITAL_COMMUNITY): Payer: Self-pay

## 2018-12-12 DIAGNOSIS — N898 Other specified noninflammatory disorders of vagina: Secondary | ICD-10-CM | POA: Diagnosis not present

## 2018-12-12 DIAGNOSIS — R109 Unspecified abdominal pain: Secondary | ICD-10-CM | POA: Diagnosis present

## 2018-12-12 DIAGNOSIS — Z3202 Encounter for pregnancy test, result negative: Secondary | ICD-10-CM | POA: Diagnosis not present

## 2018-12-12 LAB — POCT PREGNANCY, URINE: Preg Test, Ur: NEGATIVE

## 2018-12-12 LAB — POC URINE PREG, ED: Preg Test, Ur: NEGATIVE

## 2018-12-12 MED ORDER — POLYETHYLENE GLYCOL 3350 17 GM/SCOOP PO POWD: 1.0000 | Freq: Once | ORAL | 0 refills | Status: AC

## 2018-12-12 NOTE — ED Triage Notes (Signed)
Pt states she wants to have a pregnancy test done today. Pt states she has been having abdominal pain 1 month.

## 2018-12-12 NOTE — ED Provider Notes (Signed)
[facility]    CSN: [id number] Arrival date & time: 12/12/18  7017      History   Chief Complaint Chief Complaint  Patient presents with   Possible Pregnancy    HPI Heather Kramer is a 23 y.o. female.   Patient is a 23 year old female that presents today with generalized abdominal cramping, vaginal discharge and requesting pregnancy test.  Last menstrual period was 11/16/2018.  Currently sexually active, unprotected with 1 partner.  There is some concern for STDs.  Also reports mild constipation.  No current abdominal pain.  No dysuria, hematuria or urinary frequency.  No flank pain, nausea or vomiting.  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      Home Medications    Prior to Admission medications   Medication Sig Start Date End Date Taking? Authorizing Provider  cetirizine (ZYRTEC) 10 MG tablet Take 10 mg by mouth daily.    [provider]  polyethylene glycol powder (GLYCOLAX/MIRALAX) 17 GM/SCOOP powder Take 255 g by mouth once for 1 dose. 12/12/18 12/12/18  Janace Aris, NP  PROAIR HFA 108 (90 Base) MCG/ACT inhaler Inhale 2 puffs into the lungs every 4 (four) hours as needed for shortness of breath or wheezing. 11/04/17   [provider]  Vitamin D, Ergocalciferol, (DRISDOL) 50000 UNITS CAPS capsule Take 1 capsule (50,000 Units total) by mouth every 7 (seven) days. Patient not taking: Reported on 04/25/2018  02/21/14   Verneda Skill, FNP    Family History Family History  Problem Relation Age of Onset   Obesity Mother    Heart disease Father    Cancer Maternal Grandmother    Diabetes Maternal Grandfather     Social History Social History   Tobacco Use   Smoking status: Current Every Day Smoker    Types: Cigarettes   Smokeless tobacco: Never Used  Substance Use Topics   Alcohol use: Yes   Drug use: Yes    Types: Marijuana     Allergies   Peanut-containing drug products   Review of Systems Review of Systems   Physical Exam Triage Vital Signs ED Triage Vitals  Enc Vitals Group     BP 12/12/18 0927 (!) 98/58     Pulse Rate 12/12/18 0927 98     Resp 12/12/18 0927 16     Temp 12/12/18 0927 98.6 F (37 C)     Temp Source 12/12/18 0927 Oral     SpO2 12/12/18 0927 100 %     Weight 12/12/18 0924 148 lb (67.1 kg)     Height --      Head Circumference --      Peak Flow --      Pain Score 12/12/18 0924 6     Pain Loc --      Pain Edu? --  Excl. in [place]? --    No data found.  Updated Vital Signs BP (!) 98/58 (BP Location: Right Arm)    Pulse 98    Temp 98.6 F (37 C) (Oral)    Resp 16    Wt 148 lb (67.1 kg)    LMP 11/16/2018    SpO2 100%    BMI 20.07 kg/m   Visual Acuity Right Eye Distance:   Left Eye Distance:   Bilateral Distance:    Right Eye Near:   Left Eye Near:    Bilateral Near:     Physical Exam Vitals signs and nursing note reviewed.  Constitutional:      General: She is not in acute distress.    Appearance: Normal appearance. She is not ill-appearing, toxic-appearing or diaphoretic.  HENT:     Head: Normocephalic.     Nose: Nose normal.     Mouth/Throat:     Pharynx: Oropharynx is clear.  Eyes:     Conjunctiva/sclera: Conjunctivae normal.  Neck:     Musculoskeletal: Normal range of motion.  Pulmonary:     Effort: Pulmonary effort is normal.  Abdominal:     Palpations: Abdomen is soft.     Tenderness: There is no abdominal  tenderness.  Musculoskeletal: Normal range of motion.  Skin:    General: Skin is warm and dry.     Findings: No rash.  Neurological:     Mental Status: She is alert.  Psychiatric:        Mood and Affect: Mood normal.      UC Treatments / Results  Labs (all labs ordered are listed, but only abnormal results are displayed) Labs Reviewed  POCT PREGNANCY, URINE  POC URINE PREG, ED  CERVICOVAGINAL ANCILLARY ONLY    EKG   Radiology No results found.  Procedures Procedures (including critical care time)  Medications Ordered in UC Medications - No data to display  Initial Impression / Assessment and Plan / UC Course  I have reviewed the triage vital signs and the nursing notes.  Pertinent labs & imaging results that were available during my care of the patient were reviewed by me and considered in my medical decision making (see chart for details).     Vaginal discharge with abd cramping-we will send swab for STD testing.  Patient deferring treatment at this time. Pregnancy test here was negative Treating constipation with MiraLAX Follow up as needed for continued or worsening symptoms  Final Clinical Impressions(s) / UC Diagnoses   Final diagnoses:  Vaginal discharge  Abdominal cramping     Discharge Instructions     We are sending a swab for testing and we will call you with any positive results. Your pregnancy test was negative Some your symptoms could be related to constipation.  You can try doing MiraLAX.  You can do 1 scoop twice a day and to get a good bowel movement and then cut back to once a day or once every couple days. Follow up as needed for continued or worsening symptoms     ED Prescriptions    Medication Sig Dispense Auth. Provider   polyethylene glycol powder (GLYCOLAX/MIRALAX) 17 GM/SCOOP powder Take 255 g by mouth once for 1 dose. 255 g Loura Halt A, NP     PDMP not reviewed this encounter.   Orvan July, NP 12/12/18 1004

## 2018-12-12 NOTE — Discharge Instructions (Signed)
We are sending a swab for testing and we will call you with any positive results. Your pregnancy test was negative Some your symptoms could be related to constipation.  You can try doing MiraLAX.  You can do 1 scoop twice a day and to get a good bowel movement and then cut back to once a day or once every couple days. Follow up as needed for continued or worsening symptoms

## 2018-12-12 NOTE — ED Notes (Signed)
Cervicovaginal ancillary order completed. I sent to main lab for processing. 

## 2018-12-14 ENCOUNTER — Telehealth: Payer: Self-pay | Admitting: Emergency Medicine

## 2018-12-14 LAB — CERVICOVAGINAL ANCILLARY ONLY
Bacterial vaginitis: POSITIVE — AB
Candida vaginitis: NEGATIVE
Chlamydia: NEGATIVE
Neisseria Gonorrhea: NEGATIVE
Trichomonas: POSITIVE — AB

## 2018-12-14 MED ORDER — METRONIDAZOLE 500 MG PO TABS: 500.0000 mg | ORAL_TABLET | Freq: Two times a day (BID) | ORAL | 0 refills | Status: AC

## 2018-12-14 NOTE — Telephone Encounter (Signed)
Bacterial vaginosis is positive. This was not treated at the urgent care visit.  Flagyl 500 mg BID x 7 days #14 no refills sent to patients pharmacy of choice.    Trichomonas is positive. Rx  for Flagyl was sent to the pharmacy of record. Pt needs education to refrain from sexual intercourse for 7 days to give the medicine time to work. Sexual partners need to be notified and tested/treated. Condoms may reduce risk of reinfection. Recheck for further evaluation if symptoms are not improving.   Attempted to reach patient. No answer at this time. Voicemail left.    

## 2018-12-18 ENCOUNTER — Telehealth (HOSPITAL_COMMUNITY): Payer: Self-pay | Admitting: Emergency Medicine

## 2018-12-18 NOTE — Telephone Encounter (Signed)
Attempted to reach patient x2. No answer at this time. Call cannot be completed.   

## 2018-12-19 ENCOUNTER — Telehealth: Payer: Self-pay | Admitting: Emergency Medicine

## 2018-12-19 NOTE — Telephone Encounter (Signed)
Attempted to reach patient x3. No answer at this time. Voicemail left. Letter sent    

## 2019-03-14 ENCOUNTER — Ambulatory Visit (HOSPITAL_COMMUNITY)
Admission: EM | Admit: 2019-03-14 | Discharge: 2019-03-14 | Disposition: A | Discharge status: Home / Self Care | Payer: Medicaid Other | Attending: Family Medicine | Admitting: Family Medicine

## 2019-03-14 ENCOUNTER — Encounter (HOSPITAL_COMMUNITY): Payer: Self-pay | Admitting: Emergency Medicine

## 2019-03-14 ENCOUNTER — Other Ambulatory Visit: Payer: Self-pay

## 2019-03-14 DIAGNOSIS — N939 Abnormal uterine and vaginal bleeding, unspecified: Secondary | ICD-10-CM | POA: Diagnosis present

## 2019-03-14 DIAGNOSIS — Z113 Encounter for screening for infections with a predominantly sexual mode of transmission: Secondary | ICD-10-CM | POA: Insufficient documentation

## 2019-03-14 DIAGNOSIS — Z3202 Encounter for pregnancy test, result negative: Secondary | ICD-10-CM | POA: Diagnosis not present

## 2019-03-14 DIAGNOSIS — N898 Other specified noninflammatory disorders of vagina: Secondary | ICD-10-CM | POA: Diagnosis present

## 2019-03-14 LAB — POC URINE PREG, ED: Preg Test, Ur: NEGATIVE

## 2019-03-14 LAB — POCT PREGNANCY, URINE: Preg Test, Ur: NEGATIVE

## 2019-03-14 NOTE — ED Notes (Signed)
Labeled urine and cytology placed in lab

## 2019-03-14 NOTE — ED Provider Notes (Signed)
[facility]    CSN: [id number] Arrival date & time: 03/14/19  1027      History   Chief Complaint Chief Complaint  Patient presents with  . SEXUALLY TRANSMITTED DISEASE  . Possible Pregnancy    HPI Heather Kramer is a 24 y.o. female.   Patient reports vaginal discharge for the last "couple of days", denies that she has ever had BV or yeast infection.  Denies dysuria, hematuria, abdominal pain.  Denies fever, headache, rash, shortness of breath, nausea, vomiting, diarrhea, body aches, chills, other symptoms.  Requesting to be tested for STDs today as well as pregnancy.  ROS per HPI  The history is provided by the patient.    Past Medical History:  Diagnosis Date  . Acquired acanthosis nigricans   . Allergies   . Goiter   . Goiter   . Overweight(278.02)   . Pre-diabetes   . Thyroiditis, autoimmune     Patient Active Problem List   Diagnosis Date Noted  . Goiter   . Thyroiditis, autoimmune   . Acquired acanthosis nigricans   . Acromegaly and gigantism ([facility]) 06/26/2010  . Goiter, unspecified 06/26/2010  . Pre-diabetes 06/26/2010    Past Surgical History:  Procedure Laterality Date  . NO PAST SURGERIES      OB History   No obstetric history on file.      Home Medications    Prior to Admission medications   Medication Sig Start Date End Date Taking? Authorizing Provider  PROAIR HFA 108 [phone] Base) MCG/ACT inhaler Inhale 2 puffs into the lungs every 4 (four) hours as needed for shortness of breath or wheezing. 11/04/17  Yes [provider]  cetirizine (ZYRTEC) 10 MG tablet Take 10 mg by mouth daily.    [provider]  Vitamin D, Ergocalciferol, (DRISDOL) 50000 UNITS CAPS capsule Take 1 capsule (50,000 Units total) by mouth every 7 (seven) days. Patient not taking: Reported on 04/25/2018 02/21/14   Trude Mcburney, FNP    Family History Family History  Problem Relation Age of Onset  . Obesity Mother   . Heart disease Father    . Cancer Maternal Grandmother   . Diabetes Maternal Grandfather     Social History Social History   Tobacco Use  . Smoking status: Current Every Day Smoker    Types: Cigarettes  . Smokeless tobacco: Never Used  Substance Use Topics  . Alcohol use: Yes  . Drug use: Not Currently    Types: Marijuana     Allergies   Peanut-containing drug products   Review of Systems Review of Systems   Physical Exam Triage Vital Signs ED Triage Vitals  Enc Vitals Group     BP      Pulse      Resp      Temp      Temp src      SpO2      Weight      Height      Head Circumference      Peak Flow      Pain Score      Pain Loc      Pain Edu?      Excl. in [place]?    No data found.  Updated Vital Signs BP 130/75 (BP Location: Right Arm)   Pulse 83   Temp 98.5 F (36.9 C) (Oral)   Resp 16   LMP 03/11/2019   SpO2 100%      Physical Exam Vitals and  nursing note reviewed.  Constitutional:      General: She is not in acute distress.    Appearance: Normal appearance. She is well-developed and normal weight.  HENT:     Head: Normocephalic and atraumatic.  Eyes:     Conjunctiva/sclera: Conjunctivae normal.  Cardiovascular:     Rate and Rhythm: Normal rate and regular rhythm.     Heart sounds: Normal heart sounds. No murmur.  Pulmonary:     Effort: Pulmonary effort is normal. No respiratory distress.     Breath sounds: Normal breath sounds.  Abdominal:     General: Bowel sounds are normal. There is no distension.     Palpations: Abdomen is soft. There is no mass.     Tenderness: There is no abdominal tenderness. There is no guarding or rebound.     Hernia: No hernia is present.  Musculoskeletal:        General: Normal range of motion.     Cervical back: Neck supple.  Skin:    General: Skin is warm and dry.     Capillary Refill: Capillary refill takes less than 2 seconds.  Neurological:     General: No focal deficit present.     Mental Status: She is alert and  oriented to person, place, and time.  Psychiatric:        Mood and Affect: Mood normal.        Behavior: Behavior normal.      UC Treatments / Results  Labs (all labs ordered are listed, but only abnormal results are displayed) Labs Reviewed  POC URINE PREG, ED  CERVICOVAGINAL ANCILLARY ONLY    EKG   Radiology No results found.  Procedures Procedures (including critical care time)  Medications Ordered in UC Medications - No data to display  Initial Impression / Assessment and Plan / UC Course  I have reviewed the triage vital signs and the nursing notes.  Pertinent labs & imaging results that were available during my care of the patient were reviewed by me and considered in my medical decision making (see chart for details).  Clinical Course as of Mar 14 1110  Wed Mar 14, 2019  1111 POC urine pregnancy [SM]    Clinical Course User Index [SM] Moshe Cipro, NP    Presents with vaginal discharge, STD screen performed in office today.  Will call patient with results and treat accordingly.  Instructed patient abstain from sex at least until results are back and negative.  Declined pelvic exam.  Patient requested pregnancy testing today, pregnancy test was negative.  Follow-up with primary care or this office as needed. Final Clinical Impressions(s) / UC Diagnoses   Final diagnoses:  Abnormal vaginal bleeding  Screen for STD (sexually transmitted disease)  Vaginal discharge     Discharge Instructions     Your pregnancy test in office today was      Your STD tests are pending.  If your test results are positive, we will call you.  You may need additional treatment and your partner(s) may also need treatment.      Follow up with primary care or this office as needed.     ED Prescriptions    None     I have reviewed the PDMP during this encounter.   Moshe Cipro, NP 03/14/19 1121

## 2019-03-14 NOTE — Discharge Instructions (Addendum)
Your pregnancy test in office today was negative.  Your STD tests are pending.  If your test results are positive, we will call you.  You may need additional treatment and your partner(s) may also need treatment.      Follow up with primary care or this office as needed.

## 2019-03-14 NOTE — ED Triage Notes (Signed)
Patient requesting pregnancy test.  Reports vaginal discharge

## 2019-03-16 ENCOUNTER — Encounter (HOSPITAL_COMMUNITY): Payer: Self-pay

## 2019-03-16 ENCOUNTER — Telehealth (HOSPITAL_COMMUNITY): Payer: Self-pay | Admitting: Emergency Medicine

## 2019-03-16 LAB — CERVICOVAGINAL ANCILLARY ONLY
Bacterial vaginitis: NEGATIVE
Candida vaginitis: POSITIVE — AB
Chlamydia: NEGATIVE
Neisseria Gonorrhea: NEGATIVE
Trichomonas: NEGATIVE

## 2019-03-16 MED ORDER — FLUCONAZOLE 150 MG PO TABS: 150.0000 mg | ORAL_TABLET | Freq: Once | ORAL | 0 refills | Status: AC

## 2019-03-16 NOTE — Telephone Encounter (Signed)
Test for candida (yeast) was positive.  Prescription for fluconazole 150mg  po now, repeat dose in 3d if needed, #2 no refills, sent to the pharmacy of record.  Recheck or followup with PCP for further evaluation if symptoms are not improving.    Attempted to reach patient. No answer at this time. Voicemail full  If you have any questions, you may call me at [phone]

## 2019-05-01 ENCOUNTER — Emergency Department (HOSPITAL_COMMUNITY)
Admission: EM | Admit: 2019-05-01 | Discharge: 2019-05-01 | Disposition: A | Discharge status: Home / Self Care | Payer: Medicaid Other | Attending: Emergency Medicine | Admitting: Emergency Medicine

## 2019-05-01 ENCOUNTER — Encounter (HOSPITAL_COMMUNITY): Payer: Self-pay

## 2019-05-01 DIAGNOSIS — F17210 Nicotine dependence, cigarettes, uncomplicated: Secondary | ICD-10-CM | POA: Diagnosis not present

## 2019-05-01 DIAGNOSIS — R55 Syncope and collapse: Secondary | ICD-10-CM | POA: Insufficient documentation

## 2019-05-01 DIAGNOSIS — Z3202 Encounter for pregnancy test, result negative: Secondary | ICD-10-CM | POA: Diagnosis not present

## 2019-05-01 LAB — URINALYSIS, ROUTINE W REFLEX MICROSCOPIC
Bilirubin Urine: NEGATIVE
Glucose, UA: NEGATIVE mg/dL
Hgb urine dipstick: NEGATIVE
Ketones, ur: 5 mg/dL — AB
Leukocytes,Ua: NEGATIVE
Nitrite: NEGATIVE
Protein, ur: NEGATIVE mg/dL
Specific Gravity, Urine: 1.025 (ref 1.005–1.030)
pH: 6 (ref 5.0–8.0)

## 2019-05-01 LAB — COMPREHENSIVE METABOLIC PANEL
ALT: 13 U/L (ref 0–44)
AST: 15 U/L (ref 15–41)
Albumin: 4.3 g/dL (ref 3.5–5.0)
Alkaline Phosphatase: 50 U/L (ref 38–126)
Anion gap: 9 (ref 5–15)
BUN: 9 mg/dL (ref 6–20)
CO2: 24 mmol/L (ref 22–32)
Calcium: 9.1 mg/dL (ref 8.9–10.3)
Chloride: 108 mmol/L (ref 98–111)
Creatinine, Ser: 0.86 mg/dL (ref 0.44–1.00)
GFR calc Af Amer: >60 mL/min (ref >60)
GFR calc non Af Amer: >60 mL/min (ref >60)
Glucose, Bld: 96 mg/dL (ref 70–99)
Potassium: 4.1 mmol/L (ref 3.5–5.1)
Sodium: 141 mmol/L (ref 135–145)
Total Bilirubin: 0.6 mg/dL (ref 0.3–1.2)
Total Protein: 7.6 g/dL (ref 6.5–8.1)

## 2019-05-01 LAB — CBC WITH DIFFERENTIAL/PLATELET
Abs Immature Granulocytes: 0.01 10*3/uL (ref 0.00–0.07)
Basophils Absolute: 0 10*3/uL (ref 0.0–0.1)
Basophils Relative: 1 %
Eosinophils Absolute: 0.1 10*3/uL (ref 0.0–0.5)
Eosinophils Relative: 1 %
HCT: 42.4 % (ref 36.0–46.0)
Hemoglobin: 13.8 g/dL (ref 12.0–15.0)
Immature Granulocytes: 0 %
Lymphocytes Relative: 34 %
Lymphs Abs: 1.8 10*3/uL (ref 0.7–4.0)
MCH: 29.9 pg (ref 26.0–34.0)
MCHC: 32.5 g/dL (ref 30.0–36.0)
MCV: 92 fL (ref 80.0–100.0)
Monocytes Absolute: 0.4 10*3/uL (ref 0.1–1.0)
Monocytes Relative: 7 %
Neutro Abs: 3.1 10*3/uL (ref 1.7–7.7)
Neutrophils Relative %: 57 %
Platelets: 313 10*3/uL (ref 150–400)
RBC: 4.61 MIL/uL (ref 3.87–5.11)
RDW: 12.9 % (ref 11.5–15.5)
WBC: 5.4 10*3/uL (ref 4.0–10.5)
nRBC: 0 % (ref 0.0–0.2)

## 2019-05-01 LAB — POC URINE PREG, ED: Preg Test, Ur: NEGATIVE

## 2019-05-01 LAB — HIV ANTIBODY (ROUTINE TESTING W REFLEX): HIV Screen 4th Generation wRfx: NONREACTIVE

## 2019-05-01 NOTE — ED Notes (Signed)
Patient has 3 golds and one light green in the main lab

## 2019-05-01 NOTE — ED Triage Notes (Signed)
Pt states that she is pregnant and wants to check on her baby. Pt states that she has had a lot of bleeding and has used tampons for 6 months.  Pt's story is difficult to follow- pt stating she is 12 months pregnant, that tampons have held in a baby. Pt states she doesn't have a father so doesn't know how this works.   Pt denies SI/HI.

## 2019-05-01 NOTE — Discharge Instructions (Addendum)
Recommend follow-up with primary doctor, recommend reviewing safe sex practices, recommend using protection whenever engaging in sexual encounters, recommend limiting sexual partners.  We recommended obtaining testing for gonorrhea, chlamydia and syphilis.  As you did not want to complete this testing today, you may return here or may follow-up with your primary care doctor to obtain this testing at a later time.  If you develop further episodes of passing out, worsening vaginal bleeding, abdominal pain or vomiting, recommend return to ER for reassessment.

## 2019-05-01 NOTE — ED Notes (Signed)
Patient told we needed a specimen, she said she is unable to provide one at this time. She is requesting a pregnancy test and wants the provider "to open her up down there" to see if they see a baby because she thinks she is pregnant.

## 2019-05-01 NOTE — ED Provider Notes (Signed)
[facility]-EMERGENCY DEPT Provider Note   CSN: [id number] Arrival date & time: 05/01/19  1408     History Chief Complaint  Patient presents with  . Vaginal Bleeding    Caelen A Ottaway is a 24 y.o. female.  Presents to ER with chief concern of possible pregnancy.  Patient reports that she is currently sexually active with 7 partners.  Has sex with both female and female.  States she feels she is in a safe living situation.  Does not use protection.  Has no new vaginal discharge, no new vaginal bleeding.  Reports that she specifically wants to be checked for HIV but does not want any other STD testing.  Reports concern for possible pregnancy because yesterday she was feeling somewhat nauseous and had a syncopal episode.  Episode of syncope preceded by lightheadedness sensation.  No trauma associated with this episode.  She has not had any ongoing nausea, syncope.  Denies family history of sudden cardiac death, no family history CAD.  Denies any chronic medical conditions.  HPI     Past Medical History:  Diagnosis Date  . Acquired acanthosis nigricans   . Allergies   . Goiter   . Goiter   . Overweight(278.02)   . Pre-diabetes   . Thyroiditis, autoimmune     Patient Active Problem List   Diagnosis Date Noted  . Goiter   . Thyroiditis, autoimmune   . Acquired acanthosis nigricans   . 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 Goiter, unspecified 06/26/2010  . Pre-diabetes 06/26/2010    Past Surgical History:  Procedure Laterality Date  . NO PAST SURGERIES       OB History   No obstetric history on file.     Family History  Problem Relation Age of Onset  . Obesity Mother   . Heart disease Father   . Cancer Maternal Grandmother   . Diabetes Maternal Grandfather     Social History   Tobacco Use  . Smoking status: Current Every Day Smoker    Types: Cigarettes  . Smokeless tobacco: Never Used  Substance Use Topics  . Alcohol use: Yes    . Drug use: Not Currently    Types: Marijuana    Home Medications Prior to Admission medications   Medication Sig Start Date End Date Taking? Authorizing Provider  PROAIR HFA 108 [phone] Base) MCG/ACT inhaler Inhale 2 puffs into the lungs every 4 (four) hours as needed for shortness of breath or wheezing. 11/04/17  Yes [provider]  Vitamin D, Ergocalciferol, (DRISDOL) 50000 UNITS CAPS capsule Take 1 capsule (50,000 Units total) by mouth every 7 (seven) days. Patient not taking: Reported on 04/25/2018 02/21/14   Alfonso Ramus T, FNP    Allergies    Peanut-containing drug products  Review of Systems   Review of Systems  Constitutional: Negative for chills and fever.  HENT: Negative for ear pain and sore throat.   Eyes: Negative for pain and visual disturbance.  Respiratory: Negative for cough and shortness of breath.   Cardiovascular: Negative for chest pain and palpitations.  Gastrointestinal: Positive for nausea. Negative for abdominal pain and vomiting.  Genitourinary: Negative for dysuria and hematuria.  Musculoskeletal: Negative for arthralgias and back pain.  Skin: Negative for color change and rash.  Neurological: Positive for syncope. Negative for seizures.  All other systems reviewed and are negative.   Physical Exam Updated Vital Signs BP 123/73   Pulse 61   Temp 99.1 F (37.3 C) (Oral)  Resp 16   Ht 6' (1.829 m)   Wt 60.3 kg   SpO2 100%   BMI 18.04 kg/m   Physical Exam Vitals and nursing note reviewed.  Constitutional:      General: She is not in acute distress.    Appearance: She is well-developed.  HENT:     Head: Normocephalic and atraumatic.  Eyes:     Conjunctiva/sclera: Conjunctivae normal.  Cardiovascular:     Rate and Rhythm: Normal rate and regular rhythm.     Heart sounds: No murmur.  Pulmonary:     Effort: Pulmonary effort is normal. No respiratory distress.     Breath sounds: Normal breath sounds.  Abdominal:     Palpations:  Abdomen is soft.     Tenderness: There is no abdominal tenderness.  Musculoskeletal:        General: No tenderness or deformity.     Cervical back: Neck supple.  Skin:    General: Skin is warm and dry.     Capillary Refill: Capillary refill takes less than 2 seconds.  Neurological:     General: No focal deficit present.     Mental Status: She is alert and oriented to person, place, and time.     ED Results / Procedures / Treatments   Labs (all labs ordered are listed, but only abnormal results are displayed) Labs Reviewed  CBC WITH DIFFERENTIAL/PLATELET  COMPREHENSIVE METABOLIC PANEL  URINALYSIS, ROUTINE W REFLEX MICROSCOPIC  HIV ANTIBODY (ROUTINE TESTING W REFLEX)  POC URINE PREG, ED    EKG None  Radiology No results found.  Procedures Procedures (including critical care time)  Medications Ordered in ED Medications - No data to display  ED Course  I have reviewed the triage vital signs and the nursing notes.  Pertinent labs & imaging results that were available during my care of the patient were reviewed by me and considered in my medical decision making (see chart for details).    MDM Rules/Calculators/A&P                      24 year old lady presented to ER after syncopal episode yesterday, requesting to be tested for pregnancy.  Patient reported high risk sexual behavior, subsequently recommended testing for all STDs.  Patient declined this except wanted to be tested for HIV.  She denies any new discharge, vaginal bleeding, no vaginal or pelvic complaint, has normal cbc; as pt does not want GC/Chlaymdia testing, pt does not have indication for speculum exam though she reportedly told the nurse she initially wanted this to be completed. EKG without acute changes, normal intervals.  No arrhythmia.  CBC and BMP grossly within normal limits.  Recommend follow-up with primary doctor.  After the discussed management above, the patient was determined to be safe for  discharge.  The patient was in agreement with this plan and all questions regarding their care were answered.  ED return precautions were discussed and the patient will return to the ED with any significant worsening of condition.   Final Clinical Impression(s) / ED Diagnoses Final diagnoses:  Syncope, unspecified syncope type  Pregnancy test negative    Rx / DC Orders ED Discharge Orders    None       Lucrezia Starch, MD 05/01/19 [phone]

## 2019-05-26 ENCOUNTER — Emergency Department (HOSPITAL_COMMUNITY)
Admission: EM | Admit: 2019-05-26 | Discharge: 2019-05-26 | Discharge status: Absent without leave | Payer: Medicaid Other

## 2019-05-26 ENCOUNTER — Other Ambulatory Visit: Payer: Self-pay

## 2019-07-28 ENCOUNTER — Other Ambulatory Visit: Payer: Self-pay

## 2019-07-28 ENCOUNTER — Ambulatory Visit (HOSPITAL_COMMUNITY)
Admission: EM | Admit: 2019-07-28 | Discharge: 2019-07-28 | Disposition: A | Discharge status: Other Acute Inpatient Hospital | Payer: Medicaid Other | Attending: Family | Admitting: Family

## 2019-07-28 ENCOUNTER — Inpatient Hospital Stay (HOSPITAL_COMMUNITY)
Admission: AD | Admit: 2019-07-28 | Discharge: 2019-08-05 | DRG: 885 | Disposition: A | Discharge status: Home / Self Care | Payer: Medicaid Other | Source: Intra-hospital | Attending: Psychiatry | Admitting: Psychiatry

## 2019-07-28 ENCOUNTER — Encounter (HOSPITAL_COMMUNITY): Payer: Self-pay | Admitting: Emergency Medicine

## 2019-07-28 DIAGNOSIS — Z79899 Other long term (current) drug therapy: Secondary | ICD-10-CM

## 2019-07-28 DIAGNOSIS — F23 Brief psychotic disorder: Secondary | ICD-10-CM | POA: Diagnosis not present

## 2019-07-28 DIAGNOSIS — Z91010 Allergy to peanuts: Secondary | ICD-10-CM | POA: Diagnosis not present

## 2019-07-28 DIAGNOSIS — Z1330 Encounter for screening examination for mental health and behavioral disorders, unspecified: Secondary | ICD-10-CM | POA: Insufficient documentation

## 2019-07-28 DIAGNOSIS — E049 Nontoxic goiter, unspecified: Secondary | ICD-10-CM | POA: Diagnosis present

## 2019-07-28 DIAGNOSIS — G4700 Insomnia, unspecified: Secondary | ICD-10-CM | POA: Diagnosis present

## 2019-07-28 DIAGNOSIS — Z716 Tobacco abuse counseling: Secondary | ICD-10-CM

## 2019-07-28 DIAGNOSIS — Z20822 Contact with and (suspected) exposure to covid-19: Secondary | ICD-10-CM | POA: Diagnosis present

## 2019-07-28 DIAGNOSIS — F1290 Cannabis use, unspecified, uncomplicated: Secondary | ICD-10-CM | POA: Diagnosis present

## 2019-07-28 DIAGNOSIS — F29 Unspecified psychosis not due to a substance or known physiological condition: Secondary | ICD-10-CM | POA: Diagnosis present

## 2019-07-28 DIAGNOSIS — F200 Paranoid schizophrenia: Secondary | ICD-10-CM | POA: Diagnosis present

## 2019-07-28 DIAGNOSIS — Z7289 Other problems related to lifestyle: Secondary | ICD-10-CM

## 2019-07-28 DIAGNOSIS — F17210 Nicotine dependence, cigarettes, uncomplicated: Secondary | ICD-10-CM | POA: Diagnosis not present

## 2019-07-28 DIAGNOSIS — R45851 Suicidal ideations: Secondary | ICD-10-CM | POA: Diagnosis present

## 2019-07-28 DIAGNOSIS — F329 Major depressive disorder, single episode, unspecified: Secondary | ICD-10-CM | POA: Diagnosis not present

## 2019-07-28 DIAGNOSIS — E162 Hypoglycemia, unspecified: Secondary | ICD-10-CM | POA: Diagnosis present

## 2019-07-28 LAB — COMPREHENSIVE METABOLIC PANEL
ALT: 19 U/L (ref 0–44)
AST: 20 U/L (ref 15–41)
Albumin: 4 g/dL (ref 3.5–5.0)
Alkaline Phosphatase: 56 U/L (ref 38–126)
Anion gap: 9 (ref 5–15)
BUN: 10 mg/dL (ref 6–20)
CO2: 25 mmol/L (ref 22–32)
Calcium: 9.2 mg/dL (ref 8.9–10.3)
Chloride: 104 mmol/L (ref 98–111)
Creatinine, Ser: 0.83 mg/dL (ref 0.44–1.00)
GFR calc Af Amer: >60 mL/min (ref >60)
GFR calc non Af Amer: >60 mL/min (ref >60)
Glucose, Bld: 69 mg/dL — ABNORMAL LOW (ref 70–99)
Potassium: 4 mmol/L (ref 3.5–5.1)
Sodium: 138 mmol/L (ref 135–145)
Total Bilirubin: 0.3 mg/dL (ref 0.3–1.2)
Total Protein: 7.7 g/dL (ref 6.5–8.1)

## 2019-07-28 LAB — CBC
HCT: 40.8 % (ref 36.0–46.0)
Hemoglobin: 13.4 g/dL (ref 12.0–15.0)
MCH: 29.6 pg (ref 26.0–34.0)
MCHC: 32.8 g/dL (ref 30.0–36.0)
MCV: 90.3 fL (ref 80.0–100.0)
Platelets: 303 10*3/uL (ref 150–400)
RBC: 4.52 MIL/uL (ref 3.87–5.11)
RDW: 12.4 % (ref 11.5–15.5)
WBC: 6 10*3/uL (ref 4.0–10.5)
nRBC: 0 % (ref 0.0–0.2)

## 2019-07-28 LAB — POCT URINE DRUG SCREEN - MANUAL ENTRY (I-SCREEN)
POC Amphetamine UR: NOT DETECTED
POC Buprenorphine (BUP): NOT DETECTED
POC Cocaine UR: NOT DETECTED
POC Marijuana UR: POSITIVE — AB
POC Methadone UR: NOT DETECTED
POC Methamphetamine UR: NOT DETECTED
POC Morphine: NOT DETECTED
POC Oxazepam (BZO): NOT DETECTED
POC Oxycodone UR: NOT DETECTED
POC Secobarbital (BAR): NOT DETECTED

## 2019-07-28 LAB — HEMOGLOBIN A1C
Hgb A1c MFr Bld: 5.3 % (ref 4.8–5.6)
Mean Plasma Glucose: 105.41 mg/dL

## 2019-07-28 LAB — LIPID PANEL
Cholesterol: 171 mg/dL (ref 0–200)
HDL: 55 mg/dL (ref >40)
LDL Cholesterol: 102 mg/dL — ABNORMAL HIGH (ref 0–99)
Total CHOL/HDL Ratio: 3.1 RATIO
Triglycerides: 68 mg/dL (ref <150)
VLDL: 14 mg/dL (ref 0–40)

## 2019-07-28 LAB — POCT PREGNANCY, URINE: Preg Test, Ur: NEGATIVE

## 2019-07-28 LAB — ETHANOL: Alcohol, Ethyl (B): <10 mg/dL (ref <10)

## 2019-07-28 LAB — SARS CORONAVIRUS 2 BY RT PCR (HOSPITAL ORDER, PERFORMED IN ~~LOC~~ HOSPITAL LAB): SARS Coronavirus 2: NEGATIVE

## 2019-07-28 LAB — TSH: TSH: 0.684 u[IU]/mL (ref 0.350–4.500)

## 2019-07-28 NOTE — ED Notes (Signed)
Pt A&O x 4, no distress noted, watching TV at present, pt guarded but cooperative.  Denies SI.  Monitoring for safety.

## 2019-07-28 NOTE — [facility] Counselor (Addendum)
This patient has been accepted to [facility] for inpatient psychiatric admission.  Bed: 505-1  Accepting Provider: Berneice Heinrich, NP  Attending Provider: Dr. Jola Babinski  RN Call for Report: [phone]  Patient is voluntary and may arrive after 8pm this evening.  Darreld Mclean, LCSWA

## 2019-07-28 NOTE — ED Notes (Signed)
Report called to [facility] Wyvonnia Dusky, [facility].  Pending Safe transport.
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
## 2019-07-29 ENCOUNTER — Encounter (HOSPITAL_COMMUNITY): Payer: Self-pay | Admitting: Psychiatry

## 2019-07-29 ENCOUNTER — Other Ambulatory Visit: Payer: Self-pay

## 2019-07-29 DIAGNOSIS — F29 Unspecified psychosis not due to a substance or known physiological condition: Secondary | ICD-10-CM | POA: Diagnosis present

## 2019-07-29 DIAGNOSIS — F23 Brief psychotic disorder: Secondary | ICD-10-CM

## 2019-07-29 LAB — PROLACTIN: Prolactin: 16.6 ng/mL (ref 4.8–23.3)

## 2019-07-29 MED ORDER — ALUM & MAG HYDROXIDE-SIMETH 200-200-20 MG/5ML PO SUSP: 30.0000 mL | ORAL | Status: DC | PRN

## 2019-07-29 MED ORDER — RISPERIDONE 1 MG PO TABS
1.0000 mg | ORAL_TABLET | Freq: Two times a day (BID) | ORAL | Status: DC
  Administered 2019-07-29 – 2019-07-30 (×2): 1 mg via ORAL
  Filled 2019-07-29 (×6): qty 1

## 2019-07-29 MED ORDER — TRAZODONE HCL 50 MG PO TABS
50.0000 mg | ORAL_TABLET | Freq: Every evening | ORAL | Status: DC | PRN
  Administered 2019-07-30: 50 mg via ORAL
  Filled 2019-07-29: qty 1

## 2019-07-29 MED ORDER — HYDROXYZINE HCL 25 MG PO TABS
25.0000 mg | ORAL_TABLET | Freq: Three times a day (TID) | ORAL | Status: DC | PRN
  Administered 2019-07-30 – 2019-08-03 (×3): 25 mg via ORAL
  Filled 2019-07-29 (×3): qty 1

## 2019-07-29 MED ORDER — ACETAMINOPHEN 325 MG PO TABS: 650.0000 mg | ORAL_TABLET | Freq: Four times a day (QID) | ORAL | Status: DC | PRN

## 2019-07-29 MED ORDER — MAGNESIUM HYDROXIDE 400 MG/5ML PO SUSP: 30.0000 mL | Freq: Every day | ORAL | Status: DC | PRN

## 2019-07-29 MED ORDER — RISPERIDONE 1 MG PO TABS
1.0000 mg | ORAL_TABLET | Freq: Every day | ORAL | Status: DC
  Administered 2019-07-29: 1 mg via ORAL
  Filled 2019-07-29 (×2): qty 1

## 2019-07-29 NOTE — Plan of Care (Signed)
Progress note  D: pt found in bed; compliant with medication administration. Pt is suspicious and paranoid with this writers approach. Pt is watchful at times. Pt denies any physical complaints. Pt is guarded and minimal. Pt continues to be isolative to their room. Pt was encouraged to go to group but declined. Pt's dress has improved. Pt denies si/hi/ah/vh at this time and verbally agrees to approach staff if these become apparent or before harming themself/others while at bhh. Pt does endorse past avh but again, denies this at this time.  A: Pt provided support and encouragement. Pt given medication per protocol and standing orders. Q56m safety checks implemented and continued.  R: Pt safe on the unit. Will continue to monitor.  Pt progressing in the following metrics  Problem: Education: Goal: Knowledge of [place] [facility] Education information/materials will improve Outcome: Progressing Goal: Emotional status will improve Outcome: Progressing Goal: Verbalization of understanding the information provided will improve Outcome: Progressing   Problem: Activity: Goal: Sleeping patterns will improve Outcome: Progressing

## 2019-07-29 NOTE — Progress Notes (Signed)
   07/29/19 2000  COVID-19 Daily Checkoff  Have you had a fever (temp > 37.80C/100F)  in the past 24 hours?  No  If you have had runny nose, nasal congestion, sneezing in the past 24 hours, has it worsened? No  COVID-19 EXPOSURE  Have you traveled outside the state in the past 14 days? No  Have you been in contact with someone with a confirmed diagnosis of COVID-19 or PUI in the past 14 days without wearing appropriate PPE? No  Have you been living in the same home as a person with confirmed diagnosis of COVID-19 or a PUI (household contact)? No  Have you been diagnosed with COVID-19? No    07/29/19 2000  COVID-19 Daily Checkoff  Have you had a fever (temp > 37.80C/100F)  in the past 24 hours?  No  If you have had runny nose, nasal congestion, sneezing in the past 24 hours, has it worsened? No  COVID-19 EXPOSURE  Have you traveled outside the state in the past 14 days? No  Have you been in contact with someone with a confirmed diagnosis of COVID-19 or PUI in the past 14 days without wearing appropriate PPE? No  Have you been living in the same home as a person with confirmed diagnosis of COVID-19 or a PUI (household contact)? No  Have you been diagnosed with COVID-19? No

## 2019-07-29 NOTE — Tx Team (Addendum)
Initial Treatment Plan 07/29/2019 2:49 AM Heather Kramer PJS:[id number]    PATIENT STRESSORS: Medication change or noncompliance Substance abuse   PATIENT STRENGTHS: Ability for insight Motivation for treatment/growth   PATIENT IDENTIFIED PROBLEMS: Auditory &Visual hallucinations  Substance abuse      " I want peer support, someone that loves me."             DISCHARGE CRITERIA:  Improved stabilization in mood, thinking, and/or behavior  PRELIMINARY DISCHARGE PLAN: Return to previous living arrangement  PATIENT/FAMILY INVOLVEMENT: This treatment plan has been presented to and reviewed with the patient, Heather Kramer, and/or family member.  The patient and family have been given the opportunity to ask questions and make suggestions.  Floyce Stakes, RN 07/29/2019, 2:49 AM

## 2019-07-29 NOTE — BHH Suicide Risk Assessment (Signed)
[facility] Admission Suicide Risk Assessment   Nursing information obtained from:  Patient Demographic factors:  Adolescent or young adult, Living alone Current Mental Status:  Self-harm behaviors Loss Factors:  Loss of significant relationship Historical Factors:  NA Risk Reduction Factors:  NA  Total Time spent with patient: 45 minutes Principal Problem: <principal problem not specified> Diagnosis:  Active Problems:   Psychosis (HCC)  Subjective Data: Patient is seen and examined.  Patient is a 24 year old female who presented voluntarily to the behavioral health urgent care center on 07/28/2019 with her mother seeking a mental health evaluation.  The patient is significantly paranoid this time, and appears to be psychotic.  She is not a good historian.  She stated that her college was plotting against her because she changed her major multiple times.  She was disorganized at the time of the evaluation.  According to the information in the chart from the social worker evaluation who collected information from her mother that the patient had endorsed experiencing auditory and visual hallucinations for "years".  The mother stated that the patient was hearing her ex-girlfriend say negative and disparaging things about her.  The patient denied any suicidal ideation, but did endorse suicidal ideation within the last 7 days while at the [facility]  The patient denied any drug use, but when specifically asked about marijuana said "infrequently".  The patient's mother believe that the patient had smoked marijuana that have been laced with cocaine.  The patient did admit to used alcohol.  She was admitted to the hospital for evaluation and stabilization.  Continued Clinical Symptoms:  Alcohol Use Disorder Identification Test Final Score (AUDIT): 0 The "Alcohol Use Disorders Identification Test", Guidelines for Use in Primary Care, Second Edition.  World Science writer [facility]). Score between 0-7:  no or low risk  or alcohol related problems. Score between 8-15:  moderate risk of alcohol related problems. Score between 16-19:  high risk of alcohol related problems. Score 20 or above:  warrants further diagnostic evaluation for alcohol dependence and treatment.   CLINICAL FACTORS:   Alcohol/Substance Abuse/Dependencies Currently Psychotic   Musculoskeletal: Strength & Muscle Tone: within normal limits Gait & Station: normal Patient leans: N/A  Psychiatric Specialty Exam: Physical Exam Vitals and nursing note reviewed.  HENT:     Head: Normocephalic and atraumatic.  Pulmonary:     Effort: Pulmonary effort is normal.  Neurological:     General: No focal deficit present.     Mental Status: She is alert and oriented to person, place, and time.     Review of Systems  Blood pressure 112/85, pulse 63, temperature 98.5 F (36.9 C), temperature source Oral, resp. rate 18, height 6\' 1"  (1.854 m), weight 62.8 kg, SpO2 100 %.Body mass index is 18.27 kg/m.  General Appearance: Disheveled  Eye Contact:  Minimal  Speech:  Normal Rate  Volume:  Decreased  Mood:  Anxious and Dysphoric  Affect:  Constricted  Thought Process:  Goal Directed and Descriptions of Associations: Loose  Orientation:  Negative  Thought Content:  Delusions, Paranoid Ideation and Rumination  Suicidal Thoughts:  No  Homicidal Thoughts:  No  Memory:  Immediate;   Poor Recent;   Poor Remote;   Poor  Judgement:  Impaired  Insight:  Lacking  Psychomotor Activity:  Decreased  Concentration:  Concentration: Fair and Attention Span: Fair  Recall:  of Knowledge:  Fair  Language:  Fair  Akathisia:  Negative  Handed:  Right  AIMS (if indicated):  Assets:  Desire for Improvement Resilience  ADL's:  Intact  Cognition:  WNL  Sleep:  Number of Hours: 3.25      COGNITIVE FEATURES THAT CONTRIBUTE TO RISK:  Thought constriction (tunnel vision)    SUICIDE RISK:   Moderate:  Frequent suicidal ideation with  limited intensity, and duration, some specificity in terms of plans, no associated intent, good self-control, limited dysphoria/symptomatology, some risk factors present, and identifiable protective factors, including available and accessible social support.  PLAN OF CARE: Patient is seen and examined.  Patient is a 24 year old female with the above-stated past psychiatric history who was admitted to the hospital secondary to new onset psychosis.  She will be admitted to the hospital.  She will be integrated in the milieu.  She will be encouraged to attend groups.  We will start her on Risperdal 1 mg p.o. daily.  We will see how she responds with that this morning, and adjust the dosage during the daytime.  She will also have available hydroxyzine for anxiety and trazodone for sleep.  Review of her admission laboratories revealed essentially normal electrolytes including liver function enzymes.  Lipid panel was normal.  CBC was normal.  Her hemoglobin A1c was 5.3.  Pregnancy test was negative.  TSH was 0.684.  Blood alcohol was less than 10, drug screen was positive for marijuana.  EKG revealed an abnormal atrial tachycardia, but QTc interval was within normal limits.  I certify that inpatient services furnished can reasonably be expected to improve the patient's condition.   Antonieta Pert, MD 07/29/2019, 10:56 AM

## 2019-07-29 NOTE — Progress Notes (Signed)
Patient went to [facility] with her mother for medication change. She was admitted voluntarily for auditory and visual hallucinations she reports experiencing for a long time. She wants to start medication for the voices and peer support. She denies si/hi. She did make a superficial scratches to her forearms. UDS positive for THC.Skin assessment completed.  She was cooperative during admission but was somewhat paranoid and guarded. Oriented to unit, snack given. Safety maintained with 15 min checks.

## 2019-07-29 NOTE — BHH Group Notes (Signed)
[facility] LCSW Group Therapy Note  Date/Time:  07/29/2019  11:00AM-12:00PM  Type of Therapy and Topic:  Group Therapy:  Music and Mood  Participation Level:  Did Not Attend   Description of Group: In this process group, members listened to a variety of genres of music and identified that different types of music evoke different responses.  Patients were encouraged to identify music that was soothing for them and music that was energizing for them.  Patients discussed how this knowledge can help with wellness and recovery in various ways including managing depression and anxiety as well as encouraging healthy sleep habits.    Therapeutic Goals: 1. Patients will explore the impact of different varieties of music on mood 2. Patients will verbalize the thoughts they have when listening to different types of music 3. Patients will identify music that is soothing to them as well as music that is energizing to them 4. Patients will discuss how to use this knowledge to assist in maintaining wellness and recovery 5. Patients will explore the use of music as a coping skill  Summary of Patient Progress:   Did not attend Therapeutic Modalities: Solution Focused Brief Therapy Activity   Henrene Dodge, LCSW

## 2019-07-29 NOTE — H&P (Signed)
Psychiatric Admission Assessment Adult  Patient Identification: Heather Kramer MRN:  [id number] Date of Evaluation:  07/29/2019 Chief Complaint:  Psychosis (HCC) [F29] Principal Diagnosis: <principal problem not specified> Diagnosis:  Active Problems:   Psychosis (HCC)  History of Present Illness: Patient is seen and examined.  Patient is a 24 year old female who presented voluntarily to the behavioral health urgent care center on 07/28/2019 with her mother seeking a mental health evaluation.  The patient is significantly paranoid this time, and appears to be psychotic.  She is not a good historian.  She stated that her college was plotting against her because she changed her major multiple times.  She was disorganized at the time of the evaluation.  According to the information in the chart from the social worker evaluation who collected information from her mother that the patient had endorsed experiencing auditory and visual hallucinations for "years".  The mother stated that the patient was hearing her ex-girlfriend say negative and disparaging things about her.  The patient denied any suicidal ideation, but did endorse suicidal ideation within the last 7 days while at the [facility].  The patient denied any drug use, but when specifically asked about marijuana said "infrequently".  The patient's mother believe that the patient had smoked marijuana that have been laced with cocaine.  The patient did admit to used alcohol.  She was admitted to the hospital for evaluation and stabilization.  Associated Signs/Symptoms: Depression Symptoms:  anhedonia, insomnia, psychomotor retardation, suicidal thoughts without plan, anxiety, loss of energy/fatigue, disturbed sleep, (Hypo) Manic Symptoms:  Delusions, Hallucinations, Impulsivity, Irritable Mood, Anxiety Symptoms:  Excessive Worry, Psychotic Symptoms:  Delusions, Hallucinations: Auditory Paranoia, PTSD Symptoms: Negative Total Time spent with  patient: 45 minutes  Past Psychiatric History: Patient was apparently seen at the [facility] 7 days ago.  Prior to that apparently she had not had any psychiatric evaluations.  She has been using marijuana excessively.  Mother stated that the patient had been experiencing auditory and visual hallucinations for "years.  Is the patient at risk to self? Yes.    Has the patient been a risk to self in the past 6 months? Yes.    Has the patient been a risk to self within the distant past? No.  Is the patient a risk to others? No.  Has the patient been a risk to others in the past 6 months? No.  Has the patient been a risk to others within the distant past? No.   Prior Inpatient Therapy:   Prior Outpatient Therapy:    Alcohol Screening: 1. How often do you have a drink containing alcohol?: Never 2. How many drinks containing alcohol do you have on a typical day when you are drinking?: 1 or 2 3. How often do you have six or more drinks on one occasion?: Never AUDIT-C Score: 0 4. How often during the last year have you found that you were not able to stop drinking once you had started?: Never 5. How often during the last year have you failed to do what was normally expected from you because of drinking?: Never 6. How often during the last year have you needed a first drink in the morning to get yourself going after a heavy drinking session?: Never 7. How often during the last year have you had a feeling of guilt of remorse after drinking?: Never 8. How often during the last year have you been unable to remember what happened the night before because you had been drinking?: Never 9.  Have you or someone else been injured as a result of your drinking?: No 10. Has a relative or friend or a doctor or another health worker been concerned about your drinking or suggested you cut down?: No Alcohol Use Disorder Identification Test Final Score (AUDIT): 0 Alcohol Brief Interventions/Follow-up: AUDIT Score <7  follow-up not indicated Substance Abuse History in the last 12 months:  Yes.   Consequences of Substance Abuse: Medical Consequences:  Most likely contributing to her psychosis in this admission. Previous Psychotropic Medications: No  Psychological Evaluations: No  Past Medical History:  Past Medical History:  Diagnosis Date  . Acquired acanthosis nigricans   . Allergies   . Goiter   . Goiter   . Overweight(278.02)   . Pre-diabetes   . Thyroiditis, autoimmune     Past Surgical History:  Procedure Laterality Date  . NO PAST SURGERIES     Family History:  Family History  Problem Relation Age of Onset  . Obesity Mother   . Heart disease Father   . Cancer Maternal Grandmother   . Diabetes Maternal Grandfather    Family Psychiatric  History: Patient denied Tobacco Screening: Have you used any form of tobacco in the last 30 days? (Cigarettes, Smokeless Tobacco, Cigars, and/or Pipes): Yes Tobacco use, Select all that apply: 5 or more cigarettes per day Are you interested in Tobacco Cessation Medications?: Yes, will notify MD for an order Counseled patient on smoking cessation including recognizing danger situations, developing coping skills and basic information about quitting provided: Refused/Declined practical counseling Social History:  Social History   Substance and Sexual Activity  Alcohol Use Yes     Social History   Substance and Sexual Activity  Drug Use Yes  . Types: Marijuana    Additional Social History:                           Allergies:   Allergies  Allergen Reactions  . Peanut-Containing Drug Products Anaphylaxis   Lab Results:  Results for orders placed or performed during the hospital encounter of 07/28/19 (from the past 48 hour(s))  SARS Coronavirus 2 by RT PCR (hospital order, performed in [facility] hospital lab) Nasopharyngeal Nasopharyngeal Swab     Status: None   Collection Time: 07/28/19  3:15 PM   Specimen: Nasopharyngeal Swab   Result Value Ref Range   SARS Coronavirus 2 NEGATIVE NEGATIVE    Comment: (NOTE) SARS-CoV-2 target nucleic acids are NOT DETECTED.  The SARS-CoV-2 RNA is generally detectable in upper and lower respiratory specimens during the acute phase of infection. The lowest concentration of SARS-CoV-2 viral copies this assay can detect is 250 copies / mL. A negative result does not preclude SARS-CoV-2 infection and should not be used as the sole basis for treatment or other patient management decisions.  A negative result may occur with improper specimen collection / handling, submission of specimen other than nasopharyngeal swab, presence of viral mutation(s) within the areas targeted by this assay, and inadequate number of viral copies (<250 copies / mL). A negative result must be combined with clinical observations, patient history, and epidemiological information.  Fact Sheet for Patients:   BoilerBrush.com.cyhttps://www.fda.gov/media/136312/download  Fact Sheet for Healthcare Providers: https://pope.com/https://www.fda.gov/media/136313/download  This test is not yet approved or  cleared by the [place] FDA and has been authorized for detection and/or diagnosis of SARS-CoV-2 by FDA under an Emergency Use Authorization (EUA).  This EUA will remain in effect (meaning this test can  be used) for the duration of the COVID-19 declaration under Section 564(b)(1) of the Act, 21 U.S.C. section 360bbb-3(b)(1), unless the authorization is terminated or revoked sooner.  Performed at [facility], [address], [place], [place] [address]   CBC     Status: None   Collection Time: 07/28/19  5:33 PM  Result Value Ref Range   WBC 6.0 4.0 - 10.5 K/uL   RBC 4.52 3.87 - 5.11 MIL/uL   Hemoglobin 13.4 12.0 - 15.0 g/dL   HCT 98.1 36 - 46 %   MCV 90.3 80.0 - 100.0 fL   MCH 29.6 26.0 - 34.0 pg   MCHC 32.8 30.0 - 36.0 g/dL   RDW 19.1 47.8 - 29.5 %   Platelets 303 150 - 400 K/uL   nRBC 0.0 0.0 - 0.2 %    Comment: Performed  at [facility], [address], [place], [place] [address]  Comprehensive metabolic panel     Status: Abnormal   Collection Time: 07/28/19  5:33 PM  Result Value Ref Range   Sodium 138 135 - 145 mmol/L   Potassium 4.0 3.5 - 5.1 mmol/L   Chloride 104 98 - 111 mmol/L   CO2 25 22 - 32 mmol/L   Glucose, Bld 69 (L) 70 - 99 mg/dL    Comment: Glucose reference range applies only to samples taken after fasting for at least 8 hours.   BUN 10 6 - 20 mg/dL   Creatinine, Ser 8.65 0.44 - 1.00 mg/dL   Calcium 9.2 8.9 - 78.4 mg/dL   Total Protein 7.7 6.5 - 8.1 g/dL   Albumin 4.0 3.5 - 5.0 g/dL   AST 20 15 - 41 U/L   ALT 19 0 - 44 U/L   Alkaline Phosphatase 56 38 - 126 U/L   Total Bilirubin 0.3 0.3 - 1.2 mg/dL   GFR calc non Af Amer >60 >60 mL/min   GFR calc Af Amer >60 >60 mL/min   Anion gap 9 5 - 15    Comment: Performed at [facility], [address], [place], [place] [address]  Hemoglobin A1c     Status: None   Collection Time: 07/28/19  5:33 PM  Result Value Ref Range   Hgb A1c MFr Bld 5.3 4.8 - 5.6 %    Comment: (NOTE) Pre diabetes:          5.7%-6.4%  Diabetes:              >6.4%  Glycemic control for   <7.0% adults with diabetes    Mean Plasma Glucose 105.41 mg/dL    Comment: Performed at [facility], [address] [place], [place] [address]  Ethanol     Status: None   Collection Time: 07/28/19  5:33 PM  Result Value Ref Range   Alcohol, Ethyl (B) <10 <10 mg/dL    Comment: (NOTE) Lowest detectable limit for serum alcohol is 10 mg/dL.  For medical purposes only. Performed at [facility], [address], [place], [place] [address]   TSH     Status: None   Collection Time: 07/28/19  5:33 PM  Result Value Ref Range   TSH 0.684 0.350 - 4.500 uIU/mL    Comment: Performed by a 3rd Generation assay with a functional sensitivity of <=0.01 uIU/mL. Performed at [facility], [address] [place], [place] [address]   Lipid panel     Status:  Abnormal  Collection Time: 07/28/19  5:33 PM  Result Value Ref Range   Cholesterol 171 0 - 200 mg/dL   Triglycerides 68 <944 mg/dL   HDL 55 >96 mg/dL   Total CHOL/HDL Ratio 3.1 RATIO   VLDL 14 0 - 40 mg/dL   LDL Cholesterol 759 (H) 0 - 99 mg/dL    Comment:        Total Cholesterol/HDL:CHD Risk Coronary Heart Disease Risk Table                     Men   Women  1/2 Average Risk   3.4   3.3  Average Risk       5.0   4.4  2 X Average Risk   9.6   7.1  3 X Average Risk  23.4   11.0        Use the calculated Patient Ratio above and the CHD Risk Table to determine the patient's CHD Risk.        ATP III CLASSIFICATION (LDL):  <100     mg/dL   Optimal  163-846  mg/dL   Near or Above                    Optimal  130-159  mg/dL   Borderline  659-935  mg/dL   High  >701     mg/dL   Very High Performed at [facility], [address], [place], [place] [address]   POCT Urine Drug Screen - (ICup)     Status: Abnormal   Collection Time: 07/28/19  6:26 PM  Result Value Ref Range   POC Amphetamine UR None Detected None Detected   POC Secobarbital (BAR) None Detected None Detected   POC Buprenorphine (BUP) None Detected None Detected   POC Oxazepam (BZO) None Detected None Detected   POC Cocaine UR None Detected None Detected   POC Methamphetamine UR None Detected None Detected   POC Morphine None Detected None Detected   POC Oxycodone UR None Detected None Detected   POC Methadone UR None Detected None Detected   POC Marijuana UR Positive (A) None Detected  Pregnancy, urine POC     Status: None   Collection Time: 07/28/19  6:39 PM  Result Value Ref Range   Preg Test, Ur NEGATIVE NEGATIVE    Comment:        THE SENSITIVITY OF THIS METHODOLOGY IS >24 mIU/mL     Blood Alcohol level:  Lab Results  Component Value Date   ETH <10 07/28/2019   ETH <10 04/25/2018    Metabolic Disorder Labs:  Lab Results  Component Value Date   HGBA1C 5.3 07/28/2019   MPG 105.41 07/28/2019    MPG 108 02/19/2014   No results found for: PROLACTIN Lab Results  Component Value Date   CHOL 171 07/28/2019   TRIG 68 07/28/2019   HDL 55 07/28/2019   CHOLHDL 3.1 07/28/2019   VLDL 14 07/28/2019   LDLCALC 102 (H) 07/28/2019   LDLCALC 76 02/19/2014    Current Medications: Current Facility-Administered Medications  Medication Dose Route Frequency Provider Last Rate Last Admin  . acetaminophen (TYLENOL) tablet 650 mg  650 mg Oral Q6H PRN Patrcia Dolly, FNP      . alum & mag hydroxide-simeth (MAALOX/MYLANTA) 200-200-20 MG/5ML suspension 30 mL  30 mL Oral Q4H PRN Patrcia Dolly, FNP      . hydrOXYzine (ATARAX/VISTARIL) tablet 25 mg  25 mg Oral TID PRN  Patrcia Dolly, FNP      . magnesium hydroxide (MILK OF MAGNESIA) suspension 30 mL  30 mL Oral Daily PRN Patrcia Dolly, FNP      . risperiDONE (RISPERDAL) tablet 1 mg  1 mg Oral BID Antonieta Pert, MD      . traZODone (DESYREL) tablet 50 mg  50 mg Oral QHS PRN Patrcia Dolly, FNP       PTA Medications: Medications Prior to Admission  Medication Sig Dispense Refill Last Dose  . diphenhydrAMINE (BENADRYL) 2 % cream Apply topically 3 (three) times daily as needed for itching.   07/28/2019  . PROAIR HFA 108 (90 Base) MCG/ACT inhaler Inhale 2 puffs into the lungs every 4 (four) hours as needed for shortness of breath or wheezing.   Past Week at Unknown time  . Vitamin D, Ergocalciferol, (DRISDOL) 50000 UNITS CAPS capsule Take 1 capsule (50,000 Units total) by mouth every 7 (seven) days. (Patient not taking: Reported on 04/25/2018) 12 capsule 0 Unknown at Unknown time    Musculoskeletal: Strength & Muscle Tone: within normal limits Gait & Station: normal Patient leans: N/A  Psychiatric Specialty Exam: Physical Exam Vitals and nursing note reviewed.  HENT:     Head: Normocephalic and atraumatic.  Pulmonary:     Effort: Pulmonary effort is normal.  Neurological:     General: No focal deficit present.     Mental Status: She is alert.      Review of Systems  Blood pressure 112/85, pulse 63, temperature 98.5 F (36.9 C), temperature source Oral, resp. rate 18, height  (1.854 m), weight 62.8 kg, SpO2 100 %.Body mass index is 18.27 kg/m.  General Appearance: Disheveled  Eye Contact:  Minimal  Speech:  Normal Rate  Volume:  Decreased  Mood:  Anxious, Depressed and Dysphoric  Affect:  Congruent  Thought Process:  Goal Directed and Descriptions of Associations: Loose  Orientation:  Negative  Thought Content:  Delusions and Hallucinations: Auditory  Suicidal Thoughts:  No  Homicidal Thoughts:  No  Memory:  Immediate;   Poor Recent;   Poor Remote;   Poor  Judgement:  Impaired  Insight:  Lacking  Psychomotor Activity:  Normal  Concentration:  Concentration: Fair and Attention Span: Fair  Recall:  Poor  Fund of Knowledge:  Fair  Language:  Fair  Akathisia:  Negative  Handed:  Right  AIMS (if indicated):     Assets:  Desire for Improvement Resilience  ADL's:  Intact  Cognition:  WNL  Sleep:  Number of Hours: 3.25    Treatment Plan Summary: Daily contact with patient to assess and evaluate symptoms and progress in treatment, Medication management and Plan : Patient is seen and examined.  Patient is a 24 year old female with the above-stated past psychiatric history who was admitted to the hospital secondary to new onset psychosis.  She will be admitted to the hospital.  She will be integrated in the milieu.  She will be encouraged to attend groups.  We will start her on Risperdal 1 mg p.o. daily.  We will see how she responds with that this morning, and adjust the dosage during the daytime.  She will also have available hydroxyzine for anxiety and trazodone for sleep.  Review of her admission laboratories revealed essentially normal electrolytes including liver function enzymes.  Lipid panel was normal.  CBC was normal.  Her hemoglobin A1c was 5.3.  Pregnancy test was negative.  TSH was 0.684.  Blood alcohol was less  than 10, drug screen was positive for marijuana.  EKG revealed an abnormal atrial tachycardia, but QTc interval was within normal limits.  Observation Level/Precautions:  15 minute checks  Laboratory:  Chemistry Profile  Psychotherapy:    Medications:    Consultations:    Discharge Concerns:    Estimated LOS:  Other:     Physician Treatment Plan for Primary Diagnosis: <principal problem not specified> Long Term Goal(s): Improvement in symptoms so as ready for discharge  Short Term Goals: Ability to identify changes in lifestyle to reduce recurrence of condition will improve, Ability to verbalize feelings will improve, Ability to demonstrate self-control will improve, Ability to identify and develop effective coping behaviors will improve, Ability to maintain clinical measurements within normal limits will improve, Compliance with prescribed medications will improve and Ability to identify triggers associated with substance abuse/mental health issues will improve  Physician Treatment Plan for Secondary Diagnosis: Active Problems:   Psychosis (HCC)  Long Term Goal(s): Improvement in symptoms so as ready for discharge  Short Term Goals: Ability to identify changes in lifestyle to reduce recurrence of condition will improve, Ability to verbalize feelings will improve, Ability to demonstrate self-control will improve, Ability to identify and develop effective coping behaviors will improve, Ability to maintain clinical measurements within normal limits will improve, Compliance with prescribed medications will improve and Ability to identify triggers associated with substance abuse/mental health issues will improve  I certify that inpatient services furnished can reasonably be expected to improve the patient's condition.    Antonieta Pert, MD 7/18/20212:57 PM

## 2019-07-29 NOTE — Progress Notes (Signed)
Patient has been up in the dayroom putting together a puzzle alone.She remains guarded and paranoid. Writer spoke with her concerning meds available if needed. When speaking to her she appeared fidgety and restless. Safety maintained with 15 min checks.

## 2019-07-30 MED ORDER — RISPERIDONE 2 MG PO TABS
2.0000 mg | ORAL_TABLET | Freq: Every day | ORAL | Status: DC
  Administered 2019-07-31 – 2019-08-02 (×3): 2 mg via ORAL
  Filled 2019-07-30 (×5): qty 1

## 2019-07-30 NOTE — Progress Notes (Signed)
Pt very paranoid on the unit, disorganized at times    07/30/19 2100  Psych Admission Type (Psych Patients Only)  Admission Status Voluntary  Psychosocial Assessment  Patient Complaints Worrying  Eye Contact Suspiciousness;Watchful  Facial Expression Anxious;Pensive;Worried  Affect Anxious;Preoccupied  Systems analyst;Soft  Interaction Cautious;Guarded;Minimal;Poor  Motor Activity Slow  Appearance/Hygiene Unremarkable  Behavior Characteristics Cooperative  Mood Preoccupied;Pleasant  Thought Process  Coherency Blocking  Content Paranoia  Delusions Paranoid  Perception Hallucinations  Hallucination Auditory;Visual  Judgment Poor  Confusion None  Danger to Self  Current suicidal ideation? Denies  Danger to Others  Danger to Others None reported or observed

## 2019-07-30 NOTE — Progress Notes (Signed)
Recreation Therapy Notes  Date: 7.19.21 Time: 1010 Location: [address] Dayroom  Group Topic: Coping Skills  Goal Area(s) Addresses:  Patient will identify healthy and unhealthy coping strategies. Patient will identify consequences of using unhealthy coping strategies. Patient will identify benefit of using healthy coping strategies.  Intervention: Worksheet, pencils  Activity: Unhealthy vs. Healthy Coping Strategies.  Patients were to identify a problem they are currently dealing with.  Patients then identified unhealthy coping strategies they have used and the consequences of it.  Patients also identified healthy coping strategies and the expected outcomes of using healthy coping strategies.  Education: Pharmacologist, Building control surveyor.   Education Outcome: Acknowledges understanding/In group clarification offered/Needs additional education.   Clinical Observations/Feedback: Pt did not attend group session.    Caroll Rancher, LRT/CTRS         Caroll Rancher A 07/30/2019 11:57 AM

## 2019-07-30 NOTE — Progress Notes (Addendum)
Patient ID: Heather Kramer, female   DOB: 04-07-1995, 24 y.o.   MRN: [id number]   Subjective: Heather Kramer reports feeling "good" this morning. She says she did not sleep well, but slept a lot as the medicine she received last night made her very tired. She is still tired this morning lying in bed not wanting to open eyes much. She denies any side effects from her medication except for sedation. She expressed paranoid delusions that people wanted to hurt her; she specifically mentioned "the crip" and endorsed involvement in gang activity. When questioned about this further she did not want to share any further information and says "I think ya'll have taken care of it". She admits to auditory hallucinations and says they do not command her but often tell her that she is going to die. She is unclear on when she last heard these voices stating anywhere between this morning and 2 months ago. She denies any visual hallucinations. She denies HI and SI. She endorses anhedonia, increased sleep, increased appetite. Denies periods where she felt like she did not need sleep, racing thoughts, and goal oriented behavior.   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 There is also a note from 04/21 during which she expressed concerns that she was 12 months pregnant and appeared to have disorganized thought process. She continues to have paranoid delusions, disorganized thinking, and auditory hallucinations. There are no new labs to review.      Principal Problem: <principal problem not specified>    Diagnosis:  Patient Active Problem List   Diagnosis Date Noted  . Psychosis (HCC) 07/29/2019  . Goiter   . Thyroiditis, autoimmune   . Acquired acanthosis nigricans   . 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 Goiter, unspecified 06/26/2010  . Pre-diabetes 06/26/2010     Past  Psychiatric History: Seen in [facility] ED in 01/2018 for auditory and visual hallucinations in the context of significant marijuana use. At that time she was having command hallucinations. Seen in the ED 04/2019 during which she expressed that she believed herself to be 12 months pregnant and seemed to have disorganized thinking.    Past Medical History: Past Medical History:  Diagnosis Date  . Acquired acanthosis nigricans   . Allergies   . Goiter   . Goiter   . Overweight(278.02)   . Pre-diabetes   . Thyroiditis, autoimmune       Family History: Denies any family history of psychiatric illness.  Family History  Problem Relation Age of Onset  . Obesity Mother   . Heart disease Father   . Cancer Maternal Grandmother   . 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 She lives alone in [place]. Smokes marijuana 2x a month (says last use 1.5 weeks ago) and drinks a 2 shots of alcohol on the weekends 2x a month.   Social History   Socioeconomic History  . Marital status: Single    Spouse name: Not on file  . Number of children: Not on file  . Years of education: Not on file  . Highest education level: 12th grade  Occupational History  . Not on file  Tobacco Use  . Smoking status: Current Every Day Smoker    Packs/day: 1.00    Types:  Cigarettes  . Smokeless tobacco: Never Used  Substance and Sexual Activity  . Alcohol use: Yes  . Drug use: Yes    Types: Marijuana  . Sexual activity: Yes    Birth control/protection: None  Other Topics Concern  . Not on file  Social History Narrative   Lives with mother and brother. 9th grade at [employer]. Plays basketball.    Social Determinants of Health   Financial Resource Strain:   . Difficulty of Paying Living Expenses:   Food Insecurity:   . Worried About Programme researcher, broadcasting/film/video in the Last Year:   .  Barista in the Last Year:   Transportation Needs:   . Freight forwarder (Medical):   Heather Kramer Kitchen Lack of Transportation (Non-Medical):   Physical Activity:   . Days of Exercise per Week:   . Minutes of Exercise per Session:   Stress:   . Feeling of Stress :   Social Connections:   . Frequency of Communication with Friends and Family:   . Frequency of Social Gatherings with Friends and Family:   . Attends Religious Services:   . Active Member of Clubs or Organizations:   . Attends Banker Meetings:   Heather Kramer Kitchen Marital Status:        Psychiatric Specialty Exam: Psychiatric Specialty Exam: Physical Exam Constitutional:      General: She is sleeping.  Neurological:     Mental Status: She is oriented to person, place, and time and easily aroused.     Review of Systems  Constitutional: Positive for appetite change and fatigue.  Psychiatric/Behavioral: Positive for decreased concentration and hallucinations. Negative for dysphoric mood, self-injury and suicidal ideas. The patient is nervous/anxious.     Blood pressure 119/78, pulse (!) 114, temperature 98.3 F (36.8 C), temperature source Oral, resp. rate 18, height 6\' 1"  (1.854 m), weight 62.8 kg, SpO2 100 %.Body mass index is 18.27 kg/m.  General Appearance: Fairly Groomed and Guarded  Eye Contact:  Minimal  Speech:  Normal Rate  Volume:  Normal  Mood:  Euthymic  Affect:  Restricted  Thought Process:  Disorganized  Orientation:  Full (Time, Place, and Person)  Thought Content:  Illogical, Delusions, Hallucinations: Auditory and Paranoid Ideation  Suicidal Thoughts:  No  Homicidal Thoughts:  No  Memory:  Immediate;   Good Recent;   Good Remote;   Good  Judgement:  Impaired  Insight:  Lacking  Psychomotor Activity:  Normal  Concentration:  Concentration: Fair and Attention Span: Fair  Recall:  Good  Fund of Knowledge:  Good  Language:  Good  Akathisia:  No  Handed:  Right  AIMS (if indicated):     Assets:   Desire for Improvement Housing Vocational/Educational  ADL's:  Intact  Cognition:  WNL  Sleep:  Number of Hours: 6.75      Assessment: Heather Kramer is a 24 year old female with a history of marijuana induced psychosis admitted with auditory and visual hallucinations in the context of marijuana use. She endorses auditory hallucinations that are not command hallucinations. They have been going on for at least a month, but notes indicate that her mother endorses AVH for "years". She also has anhedonia, increased sleep, increased appetite, and notes indicate prior SI (though she denies SI today). She still is experiencing paranoid delusions today and auditory hallucinations, but no visual hallucinations. She responded well to risperidone with though she has experinced sedation.    DDX:  Substance induced psychosis -  history of prior substance induced psychosis in the setting of marijuana use, positive urine drug screen for marijuana. However, family claims AVH for "years" possibly before marijuana usage, but this is unclear.   Schizophrenia - Family reports history of AVH for "years", negative symptoms such as anhedonia present, patient reports little desire to interact with others claiming to go directly between home and work. She also may have a genetic predisposition as her primary care provider was concerned about Marfan which was never ruled out and has an association with schizophrenia. However, her isolation could also be secondary to paranoia and may not have occurred till after psychotic symptoms began.   Mood disorder with psychotic symptoms - Notes indicate patient has history of SI and depressive symptoms. She endorses anhedonia, increased appetite, and increased sleep. However, she denies any feelings of low mood or history of manic symptoms.   Hypoglycemia induced psychosis - patient had low blood sugar at presentation (69) and history of prediabetes. However, symptoms have not improved  with sugar intake and psychotic symptoms are still present in the setting of what is likely a normal blood glucose level.    Plan: Continue Risperidone 1 mg BID for psychosis  Continue Trazodone 50 mg at bedtime PRN for sleep  Continue Hydroxyzine 25 mg TID PRN for anxiety  Disposition Pending  Case discussed and plan agreed upon as outlined above by medical student Laverna Peace.  Patient on examination today remains significantly paranoid.  During my evaluation the patient she was more focused on putting on underarm deodorant, but remain very vigilant and was looking around in a paranoid manner.  She did state that the auditory hallucinations had decreased.  She was very nonverbal.  Review of her admission laboratories showed essentially normal electrolytes, normal lipid panel, essentially normal CBC.  Her TSH was low normal at 0.684.  Pregnancy test was negative.  Drug screen was positive for marijuana.  She is very dysphoric, and we will have to consider the addition of mood stabilization versus antidepressant medicine if we do not see significant improvement with antipsychotic medication alone.

## 2019-07-30 NOTE — Progress Notes (Signed)
Recreation Therapy Notes  INPATIENT RECREATION THERAPY ASSESSMENT  Patient Details Name: Heather Kramer MRN: [id number] DOB: Dec 16, 1995 Today's Date: 07/30/2019       Information Obtained From: Patient  Able to Participate in Assessment/Interview: Yes  Patient Presentation: Alert ([place])  Reason for Admission (Per Patient): Other (Comments) (Pt stated she needed rest)  Patient Stressors:  (None identified)  Coping Skills:   TV, Music, Exercise, Meditate, Deep Breathing, Substance Abuse, Art, Prayer, Avoidance, Intrusive Behavior, Read, Dance, Hot Bath/Shower  Leisure Interests (2+):  Art - Draw, Art - Paint, Individual - Other (Comment) (Sleep)  Frequency of Recreation/Participation: Other (Comment) (Sleep- Daily; Paint, Draw- Monthly)  Awareness of Community Resources:  Yes  Community Resources:  Restaurants, Engineering geologist, [employer]  Current Use: Yes  If no, Barriers?:    Expressed Interest in [employer] Information: No  [employer] of Residence:  Engineer, technical sales  Patient Main Form of Transportation: Set designer  Patient Strengths:  Chief Executive Officer; Responsible with money  Patient Identified Areas of Improvement:  None  Patient Goal for Hospitalization:  "I don't know"  Current SI (including self-harm):  No  Current HI:  No  Current AVH: Yes (Both- Hearing and Seeing nothing specific)  Staff Intervention Plan: Group Attendance, Collaborate with Interdisciplinary Treatment Team  Consent to Intern Participation: N/A   Caroll Rancher, LRT/CTRS  Caroll Rancher A 07/30/2019, 2:23 PM

## 2019-07-30 NOTE — Tx Team (Signed)
Interdisciplinary Treatment and Diagnostic Plan Update  07/30/2019 Time of Session: 150p Heather Kramer MRN: [id number]  Principal Diagnosis: <principal problem not specified>  Secondary Diagnoses: Active Problems:   Psychosis ([place])   Current Medications:  Current Facility-Administered Medications  Medication Dose Route Frequency Provider Last Rate Last Admin  . acetaminophen (TYLENOL) tablet 650 mg  650 mg Oral Q6H PRN Emmaline Kluver, FNP      . alum & mag hydroxide-simeth (MAALOX/MYLANTA) 200-200-20 MG/5ML suspension 30 mL  30 mL Oral Q4H PRN Emmaline Kluver, FNP      . hydrOXYzine (ATARAX/VISTARIL) tablet 25 mg  25 mg Oral TID PRN Emmaline Kluver, FNP      . magnesium hydroxide (MILK OF MAGNESIA) suspension 30 mL  30 mL Oral Daily PRN Emmaline Kluver, FNP      . [START ON 07/31/2019] risperiDONE (RISPERDAL) tablet 2 mg  2 mg Oral QHS Sharma Covert, MD      . traZODone (DESYREL) tablet 50 mg  50 mg Oral QHS PRN Emmaline Kluver, FNP       PTA Medications: Medications Prior to Admission  Medication Sig Dispense Refill Last Dose  . diphenhydrAMINE (BENADRYL) 2 % cream Apply topically 3 (three) times daily as needed for itching.   07/28/2019  . PROAIR HFA 108 (90 Base) MCG/ACT inhaler Inhale 2 puffs into the lungs every 4 (four) hours as needed for shortness of breath or wheezing.   Past Week at Unknown time  . Vitamin D, Ergocalciferol, (DRISDOL) 50000 UNITS CAPS capsule Take 1 capsule (50,000 Units total) by mouth every 7 (seven) days. (Patient not taking: Reported on 04/25/2018) 12 capsule 0 Unknown at Unknown time    Patient Stressors: Medication change or noncompliance Substance abuse  Patient Strengths: Ability for insight Motivation for treatment/growth  Treatment Modalities: Medication Management, Group therapy, Case management,  1 to 1 session with clinician, Psychoeducation, Recreational therapy.   Physician Treatment Plan for Primary Diagnosis: <principal problem not  specified> Long Term Goal(s): Improvement in symptoms so as ready for discharge Improvement in symptoms so as ready for discharge   Short Term Goals: Ability to identify changes in lifestyle to reduce recurrence of condition will improve Ability to verbalize feelings will improve Ability to demonstrate self-control will improve Ability to identify and develop effective coping behaviors will improve Ability to maintain clinical measurements within normal limits will improve Compliance with prescribed medications will improve Ability to identify triggers associated with substance abuse/mental health issues will improve Ability to identify changes in lifestyle to reduce recurrence of condition will improve Ability to verbalize feelings will improve Ability to demonstrate self-control will improve Ability to identify and develop effective coping behaviors will improve Ability to maintain clinical measurements within normal limits will improve Compliance with prescribed medications will improve Ability to identify triggers associated with substance abuse/mental health issues will improve  Medication Management: Evaluate patient's response, side effects, and tolerance of medication regimen.  Therapeutic Interventions: 1 to 1 sessions, Unit Group sessions and Medication administration.  Evaluation of Outcomes: Not Met  Physician Treatment Plan for Secondary Diagnosis: Active Problems:   Psychosis (Heather Kramer)  Long Term Goal(s): Improvement in symptoms so as ready for discharge Improvement in symptoms so as ready for discharge   Short Term Goals: Ability to identify changes in lifestyle to reduce recurrence of condition will improve Ability to verbalize feelings will improve Ability to demonstrate self-control will improve Ability to identify and develop effective coping behaviors will improve Ability to  maintain clinical measurements within normal limits will improve Compliance with prescribed  medications will improve Ability to identify triggers associated with substance abuse/mental health issues will improve Ability to identify changes in lifestyle to reduce recurrence of condition will improve Ability to verbalize feelings will improve Ability to demonstrate self-control will improve Ability to identify and develop effective coping behaviors will improve Ability to maintain clinical measurements within normal limits will improve Compliance with prescribed medications will improve Ability to identify triggers associated with substance abuse/mental health issues will improve     Medication Management: Evaluate patient's response, side effects, and tolerance of medication regimen.  Therapeutic Interventions: 1 to 1 sessions, Unit Group sessions and Medication administration.  Evaluation of Outcomes: Not Met   RN Treatment Plan for Primary Diagnosis: <principal problem not specified> Long Term Goal(s): Knowledge of disease and therapeutic regimen to maintain health will improve  Short Term Goals: Ability to remain free from injury will improve, Ability to verbalize frustration and anger appropriately will improve, Ability to demonstrate self-control, Ability to participate in decision making will improve, Ability to verbalize feelings will improve, Ability to disclose and discuss suicidal ideas, Ability to identify and develop effective coping behaviors will improve and Compliance with prescribed medications will improve  Medication Management: RN will administer medications as ordered by provider, will assess and evaluate patient's response and provide education to patient for prescribed medication. RN will report any adverse and/or side effects to prescribing provider.  Therapeutic Interventions: 1 on 1 counseling sessions, Psychoeducation, Medication administration, Evaluate responses to treatment, Monitor vital signs and CBGs as ordered, Perform/monitor CIWA, COWS, AIMS and  Fall Risk screenings as ordered, Perform wound care treatments as ordered.  Evaluation of Outcomes: Not Met   LCSW Treatment Plan for Primary Diagnosis: <principal problem not specified> Long Term Goal(s): Safe transition to appropriate next level of care at discharge, Engage patient in therapeutic group addressing interpersonal concerns.  Short Term Goals: Engage patient in aftercare planning with referrals and resources, Increase social support, Increase ability to appropriately verbalize feelings, Increase emotional regulation, Facilitate acceptance of mental health diagnosis and concerns, Facilitate patient progression through stages of change regarding substance use diagnoses and concerns, Identify triggers associated with mental health/substance abuse issues and Increase skills for wellness and recovery  Therapeutic Interventions: Assess for all discharge needs, 1 to 1 time with Social worker, Explore available resources and support systems, Assess for adequacy in community support network, Educate family and significant other(s) on suicide prevention, Complete Psychosocial Assessment, Interpersonal group therapy.  Evaluation of Outcomes: Not Met   Progress in Treatment: Attending groups: Yes. Participating in groups: Yes. Taking medication as prescribed: Yes. Toleration medication: Yes. Family/Significant other contact made: No, will contact:  mom Patient understands diagnosis: Yes. Discussing patient identified problems/goals with staff: Yes. Medical problems stabilized or resolved: Yes. Denies suicidal/homicidal ideation: Yes. Issues/concerns per patient self-inventory: No. Other:  New problem(s) identified: No, Describe:  none  New Short Term/Long Term Goal(s):  Patient Goals:    Discharge Plan or Barriers:   Reason for Continuation of Hospitalization: Medication stabilization  Estimated Length of Stay:  Attendees: Patient: Patient did not attend  07/30/2019 4:15 PM   Physician: Mallie Darting  07/30/2019 4:15 PM  Nursing:  07/30/2019 4:15 PM  RN Care Manager: 07/30/2019 4:15 PM  Social Worker: Macon Large 07/30/2019 4:15 PM  Recreational Therapist:  07/30/2019 4:15 PM  Other:  07/30/2019 4:15 PM  Other:  07/30/2019 4:15 PM  Other: 07/30/2019 4:15 PM    Scribe for  Treatment Team: Bethann Berkshire, [place] 07/30/2019 4:15 PM

## 2019-07-31 DIAGNOSIS — F29 Unspecified psychosis not due to a substance or known physiological condition: Secondary | ICD-10-CM

## 2019-07-31 NOTE — Progress Notes (Signed)
   07/31/19 1005  Psych Admission Type (Psych Patients Only)  Admission Status Voluntary  Psychosocial Assessment  Patient Complaints Worrying  Eye Contact Suspiciousness;Watchful  Facial Expression Anxious;Pensive;Worried  Affect Anxious;Preoccupied  Systems analyst;Soft  Interaction Cautious;Guarded;Minimal;Poor  Motor Activity Slow  Appearance/Hygiene Unremarkable  Behavior Characteristics Cooperative  Mood Anxious;Preoccupied;Suspicious  Thought Process  Coherency Blocking  Content Paranoia  Delusions Paranoid  Perception Hallucinations  Hallucination Auditory;Visual  Judgment Poor  Confusion None  Danger to Self  Current suicidal ideation? Denies  Danger to Others  Danger to Others None reported or observed

## 2019-07-31 NOTE — Progress Notes (Addendum)
Patient ID: Kathaleen Bury, female   DOB: 03/13/1995, 24 y.o.   MRN: [id number] Subjective: This morning patient reports feeling "good" today. She says she is tired though and wants to sleep all day. Asked if I would leave the room because she is not interested in talking right now. Was agreeable to me returning to speak with her in the afternoon.  In the afternoon Sadadya reports feeling "good today and getting better from people's mouths". She reports sleeping well and denies any medication side effects. Her appetite is good. She reports that she is having ongoing auditory hallucinations. She says they have been going on for 5 months now. These hallucinations do not command her to anything and the content of her hallucinations have been consistent according to her. She denies SI, HI, and visual hallucinations.    Objective: Patient is a 24 year old female with a history of substance induced psychosis admitted for psychosis in the setting of marijuana use. Denies any side effects from medications, though is still very tired today. There are no new labs to review.    Principal Problem: <principal problem not specified>    Diagnosis:  Patient Active Problem List   Diagnosis Date Noted  . Psychosis ([place]) 07/29/2019  . Goiter   . Thyroiditis, autoimmune   . Acquired acanthosis nigricans   . 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 Goiter, unspecified 06/26/2010  . Pre-diabetes 06/26/2010     Past Psychiatric History: Seen in [facility] ED in 01/2018 for auditory and visual hallucinations in the context of significant marijuana use. At that time she was having command hallucinations. Seen in the ED 04/2019 during which she expressed that she believed herself to be 12 months pregnant and seemed to have disorganized thinking.    Past Medical History: Past Medical History:  Diagnosis Date  . Acquired acanthosis nigricans   . Allergies   . Goiter   . Goiter   . Overweight(278.02)   .  Pre-diabetes   . Thyroiditis, autoimmune       Family History: Family History  Problem Relation Age of Onset  . Obesity Mother   . Heart disease Father   . Cancer Maternal Grandmother   . 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 She lives alone in [place]. Smokes marijuana 2x a month (says last use 1.5 weeks ago) and drinks a 2 shots of alcohol on the weekends 2x a month.  Social History   Socioeconomic History  . Marital status: Single    Spouse name: Not on file  . Number of children: Not on file  . Years of education: Not on file  . Highest education level: 12th grade  Occupational History  . Not on file  Tobacco Use  . Smoking status: Current Every Day Smoker    Packs/day: 1.00    Types: Cigarettes  . Smokeless tobacco: Never Used  Substance and Sexual Activity  . Alcohol use: Yes  . Drug use: Yes    Types: Marijuana  . Sexual activity: Yes    Birth control/protection: None  Other Topics Concern  . Not on file  Social History Narrative   Lives with mother and brother. 9th grade at [employer]. Plays basketball.    Social Determinants of Health   Financial Resource Strain:   . Difficulty of Paying Living Expenses:  Food Insecurity:   . Worried About Charity fundraiser in the Last Year:   . Arboriculturist in the Last Year:   Transportation Needs:   . Film/video editor (Medical):   Marland Kitchen Lack of Transportation (Non-Medical):   Physical Activity:   . Days of Exercise per Week:   . Minutes of Exercise per Session:   Stress:   . Feeling of Stress :   Social Connections:   . Frequency of Communication with Friends and Family:   . Frequency of Social Gatherings with Friends and Family:   . Attends Religious Services:   . Active Member of Clubs or Organizations:   . Attends Archivist Meetings:   Marland Kitchen Marital Status:         Psychiatric Specialty Exam: Psychiatric Specialty Exam: Physical Exam Constitutional:      Appearance: Normal appearance.  Neurological:     Mental Status: She is alert and oriented to person, place, and time.     Review of Systems  Psychiatric/Behavioral: Positive for confusion, decreased concentration and hallucinations. Negative for dysphoric mood, self-injury and suicidal ideas. The patient is nervous/anxious.     Blood pressure 90/61, pulse (!) 115, temperature 97.9 F (36.6 C), temperature source Oral, resp. rate 18, height _0  (1.854 m), weight 62.8 kg, SpO2 100 %.Body mass index is 18.27 kg/m.  General Appearance: Guarded  Eye Contact:  Minimal  Speech:  Normal Rate  Volume:  Normal  Mood:  "good, getting better"  Affect:  Restricted  Thought Process:  Disorganized  Orientation:  Full (Time, Place, and Person)  Thought Content:  Illogical, Delusions, Hallucinations: Auditory and Paranoid Ideation  Suicidal Thoughts:  No  Homicidal Thoughts:  No  Memory:  Immediate;   Fair Recent;   Fair Remote;   Fair  Judgement:  Impaired  Insight:  Lacking  Psychomotor Activity:  Normal  Concentration:  Concentration: Fair  Recall:  Good  Fund of Knowledge:  Good  Language:  Good  Akathisia:  No  Handed:  Right  AIMS (if indicated):     Assets:  Housing Resilience  ADL's:  Intact  Cognition:  WNL  Sleep:  Number of Hours: 7.75      Assessment: Jo-Anne Kluth is a 24 year old female with a history of marijuana induced psychosis admitted with auditory and visual hallucinations in the context of marijuana use. She endorses ongoing auditory hallucinations that are not command hallucinations. Her thought are still disorganized today,but she is more coherent. Her eye contact has improved and she is more willing to converse today. Overall, improved from yesterday but still experiencing symptoms of psychosis.    DDX:  Substance induced psychosis - history of prior substance  induced psychosis in the setting of marijuana use, positive urine drug screen for marijuana. However, family claims AVH for "years" possibly before marijuana usage, but this is unclear.   Schizophrenia - Family reports history of AVH for "years", negative symptoms such as anhedonia present, patient reports little desire to interact with others claiming to go directly between home and work. She also may have a genetic predisposition as her primary care provider was concerned about Marfan which was never ruled out and has an association with schizophrenia. However, her isolation could also be secondary to paranoia and may not have occurred till after psychotic symptoms began.   Mood disorder with psychotic symptoms - Notes indicate patient has history of SI and depressive symptoms. She endorses anhedonia, increased appetite, and  increased sleep. However, she denies any feelings of low mood or history of manic symptoms.   Hypoglycemia induced psychosis - patient had low blood sugar at presentation (69) and history of prediabetes. However, symptoms have not improved with sugar intake and psychotic symptoms are still present in the setting of what is likely a normal blood glucose level.    Plan: Continue Risperidone 2 mg at bedtime for psychosis  Continue Trazodone 50 mg at bedtime PRN for sleep  Continue Hydroxyzine 25 mg TID PRN for anxiety   07/31/2019 at 6, 10 PM   Patient discussed with treatment team/ medical student, and have met with patient. 24 year old female, presented to hospital on 7/17 describing  auditory hallucinations, describing hearing voice of ex SO making disparaging /critical comments, presenting paranoid and with disorganized thought process . She also reported some SI on admission. She reported drinking in binges and regular cannabis use .Admission UDS was positive for cannabis, admission BAL negative.She reported past history of psychosis/hallucinations. Today reported  ongoing auditory hallucinations, which she reports have been occurring for months. Denied command hallucinations. Thought process appears improved, better organized than on admission. On my assessment today, she is alert, attentive, calm without psychomotor agitation or restlessness. Appears calm, polite on approach although vaguely paranoid/guarded. Denies SI or HI, endorses hallucinations but not at this time and does not appear overtly internally preoccupied. She is 0x 3. Denies medication side effects other than some sedation. Dx- Psychosis, consider Substance Induced, Cannabis Use Disorder Plan- changed Risperidone to 2 mgrs QHS ( from 1 mgr BID) to decrease morning sedation. Continue Vistaril 25 mgrs Q 8 hours PRN for anxiety as needed. D/C Trazodone . Check EKG to monitor QTc   Gabriel Earing, MD
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
## 2019-07-31 NOTE — B[facility] Suicide Risk Assessment (Signed)
[facility] INPATIENT:  Family/Significant Other Suicide Prevention Education  Suicide Prevention Education:  Education Completed; Psychiatrist Mother,  (name of family member/significant other) has been identified by the patient as the family member/significant other with whom the patient will be residing, and identified as the person(s) who will aid the patient in the event of a mental health crisis (suicidal ideations/suicide attempt).  With written consent from the patient, the family member/significant other has been provided the following suicide prevention education, prior to the and/or following the discharge of the patient.  The suicide prevention education provided includes the following:  Suicide risk factors  Suicide prevention and interventions  National Suicide Hotline telephone number  [facility] assessment telephone number  [facility] Emergency Assistance 911  [facility] and/or Residential Mobile Crisis Unit telephone number  Request made of family/significant other to:  Remove weapons (e.g., guns, rifles, knives), all items previously/currently identified as safety concern.    Remove drugs/medications (over-the-counter, prescriptions, illicit drugs), all items previously/currently identified as a safety concern.  The family member/significant other verbalizes understanding of the suicide prevention education information provided.  The family member/significant other agrees to remove the items of safety concern listed above.  Mother confirms that pt has an apartment though is at risk of being evicted. Currently applying for housing assistance. Mom will seek shelter placement as back up. Pt mom also confirms that she will be pt's ride upon discharge.   Erin Sons 07/31/2019, 1:27 PM

## 2019-08-01 MED ORDER — RISPERIDONE 1 MG PO TABS
1.0000 mg | ORAL_TABLET | ORAL | Status: DC
  Administered 2019-08-02 – 2019-08-05 (×4): 1 mg via ORAL
  Filled 2019-08-01 (×7): qty 1

## 2019-08-01 NOTE — Progress Notes (Addendum)
Patient ID: Heather Kramer, female   DOB: [birth year], 24 y.o.   MRN: [id number] Subjective: Heather Kramer reports feeling "good" this morning. However, she complains of a headache, stuffy, nose and left upper quadrant pain. All symptoms began this morning around 9 am after she ate her breakfast. LUQ pain is sharp and does not radiate and is worse with palpation. Headache is bilateral and aching in nature. She denies sore throat, cough, constipation, diarrhea, chills.  Otherwise, Heather Kramer reports good appetite and says she feels the medication is helping her. She denies AVH at this time stating she last heard voices speaking to her yesterday afternoon. She denies SI and HI.   Objective: Patient is a 24 year old female with a history of substance induced psychosis admitted for psychosis in the setting of marijuana use. She is experiencing headache, stuffy nose and abdominal pain today. Abdominal pain is sharp localized to the LUQ and is worse with palpation. There is generalized tenderness throughout the abdomen. Headache is bilateral and aching in nature. All symptoms started this morning. She has also experienced tachy cardia. ECG currently ordered, not yet done.    Principal Problem: <principal problem not specified>    Diagnosis:  Patient Active Problem List   Diagnosis Date Noted  . Psychosis ([place]) 07/29/2019  . Goiter   . Thyroiditis, autoimmune   . Acquired acanthosis nigricans   . 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 Goiter, unspecified 06/26/2010  . Pre-diabetes 06/26/2010     Past Psychiatric History: Seen in [facility] ED in 01/2018 for auditory and visual hallucinations in the context of significant marijuana use. At that time she was having command hallucinations. Seen in the ED 04/2019 during which she expressed that she believed herself to be 12 months pregnant and seemed to have disorganized thinking.    Past Medical History: Past Medical History:  Diagnosis Date  .  Acquired acanthosis nigricans   . Allergies   . Goiter   . Goiter   . Overweight(278.02)   . Pre-diabetes   . Thyroiditis, autoimmune       Family History: Patient today reports that grandmother died of sickle cell disease.  Family History  Problem Relation Age of Onset  . Obesity Mother   . Heart disease Father   . Cancer Maternal Grandmother   . 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 She lives alone in [place]. Smokes marijuana 2x a month (says last use 1.5 weeks ago) and drinks a 2 shots of alcohol on the weekends 2x a month.  Social History   Socioeconomic History  . Marital status: Single    Spouse name: Not on file  . Number of children: Not on file  . Years of education: Not on file  . Highest education level: 12th grade  Occupational History  . Not on file  Tobacco Use  . Smoking status: Current Every Day Smoker    Packs/day: 1.00    Types: Cigarettes  . Smokeless tobacco: Never Used  Substance and Sexual Activity  . Alcohol use: Yes  . Drug use: Yes    Types: Marijuana  . Sexual activity: Yes    Birth control/protection: None  Other Topics Concern  . Not on file  Social History Narrative   Lives with mother and brother. 9th grade at [employer]. Plays basketball.  Social Determinants of Health   Financial Resource Strain:   . Difficulty of Paying Living Expenses:   Food Insecurity:   . Worried About Charity fundraiser in the Last Year:   . Arboriculturist in the Last Year:   Transportation Needs:   . Film/video editor (Medical):   Marland Kitchen Lack of Transportation (Non-Medical):   Physical Activity:   . Days of Exercise per Week:   . Minutes of Exercise per Session:   Stress:   . Feeling of Stress :   Social Connections:   . Frequency of Communication with Friends and Family:   . Frequency of Social Gatherings with  Friends and Family:   . Attends Religious Services:   . Active Member of Clubs or Organizations:   . Attends Archivist Meetings:   Marland Kitchen Marital Status:        Psychiatric Specialty Exam: Psychiatric Specialty Exam: Physical Exam Abdominal:     General: Abdomen is flat.     Palpations: Abdomen is soft.     Tenderness: There is generalized abdominal tenderness and tenderness in the left upper quadrant.  Neurological:     Mental Status: She is oriented to person, place, and time.     Review of Systems  Constitutional: Negative for chills.  HENT: Positive for congestion.   Respiratory: Negative for cough and shortness of breath.   Gastrointestinal: Positive for abdominal pain. Negative for constipation, diarrhea, nausea and vomiting.  Neurological: Positive for headaches. Negative for dizziness.  Psychiatric/Behavioral: Negative for decreased concentration, dysphoric mood, hallucinations, sleep disturbance and suicidal ideas. The patient is not nervous/anxious.     Blood pressure 114/65, pulse (!) 125, temperature 98.2 F (36.8 C), temperature source Oral, resp. rate 18, height 6' 1" (1.854 m), weight 62.8 kg, SpO2 100 %.Body mass index is 18.27 kg/m.  General Appearance: Fairly Groomed and Guarded  Eye Contact:  Minimal  Speech:  Normal Rate  Volume:  Normal  Mood:  Dysphoric  Affect:  Congruent and Constricted  Thought Process:  Disorganized  Orientation:  Full (Time, Place, and Person)  Thought Content:  Illogical  Suicidal Thoughts:  No  Homicidal Thoughts:  No  Memory:  Immediate;   Good Recent;   Fair Remote;   Good  Judgement:  Fair  Insight:  Fair  Psychomotor Activity:  Normal  Concentration:  Concentration: Good  Recall:  Good  Fund of Knowledge:  Good  Language:  Good  Akathisia:  No  Handed:  Right  AIMS (if indicated):     Assets:  Desire for Improvement Housing Resilience  ADL's:  Intact  Cognition:  WNL  Sleep:  Number of Hours: 8.75       Assessment: Heather Kramer is a 24 year old female with a history of substance induced psychosis admitted for psychosis in the setting of marijuana use. She is reporting headache, stuffy nose, and abdominal pain today which is likely a side effect of risperidone. It is also possible that she has developed an infection, though this is less likely given she has not had a fever or any exposure to sick contacts. Her psychotic symptoms appear to be improving. Her thoughts are more organized today and somewhat more logical.    DDX:  Substance induced psychosis - history of prior substance induced psychosis in the setting of marijuana use, positive urine drug screen for marijuana. However, family claims AVH for "years" possibly before marijuana usage, but this is unclear.    Schizophrenia -  Family reports history of AVH for "years", negative symptoms such as anhedonia present, patient reports little desire to interact with others claiming to go directly between home and work. She also may have a genetic predisposition as her primary care provider was concerned about Marfan which was never ruled out and has an association with schizophrenia. However, her isolation could also be secondary to paranoia and may not have occurred till after psychotic symptoms began.    Mood disorder with psychotic symptoms - Notes indicate patient has history of SI and depressive symptoms. She endorses anhedonia, increased appetite, and increased sleep. However, she denies any feelings of low mood or history of manic symptoms.    Hypoglycemia induced psychosis - patient had low blood sugar at presentation (69) and history of prediabetes. However, symptoms have not improved with sugar intake and psychotic symptoms are still present in the setting of what is likely a normal blood glucose level.    Plan: ECG pending to check QTc given antipsychotic adminstration Continue Risperidone 2 mg at bedtime  Continue Tylenol 650 mg PRN Q6H  for pain  Continue hydroxyzine 25 mg TID PRN for anxiety  Continue Risperdal 2 mg in the evening for psychosis  Mikael Spray MS3  08/01/2019 at 3,50 PM  I have discussed case with treatment team , medical student , and have met with patient. 24 year old female, presented to hospital on 7/17 describing  auditory hallucinations, describing hearing voice of ex SO making disparaging /critical comments, presenting paranoid and with disorganized thought process . She also reported some SI on admission. She reported drinking in binges and regular cannabis use .Admission UDS was positive for cannabis, admission BAL negative.She reported past history of psychosis/hallucinations Today patient reports she is " doing all right". Earlier today reported headache and nasal congestion . At this time does not endorse. No cough, no dyspnea reported or noted during session and she does not appear acutely ill or in distress. Temperature was 98.2 this AM. * tested COVID negative on 7/17  She presents with some improvement - today presents alert, attentive, appears better related with improving eye contact and more communicative/verbal. Affect remains blunted, although does smile briefly at times during session. She denies hallucinations at this time and does not appear internally preoccupied at present .She denies suicidal or self injurious ideations, denies HI. She is future oriented and spoke about wanting to go to college but concerns that completing [employer] paperwork for financial aid required mother's assistance  With her express consent I spoke with her mother via phone. Mother corroborates patient has improved compared to admission and states that  patient does much better when she abstains from cannabis or alcohol .  No agitated or disruptive behaviors on unit  Denies medication side effects, other than feeling sedated  Plan- continue inpatient treatment.  Treatment team working on disposition  planning. Continue to encourage efforts to work on abstinence and sobriety.  Increase Risperidone to 1 mgr QAM and 2 mgrs QHS for psychosis.  ( *7/17 EKG QTc 420)  Continue Vistaril 25 mgrs TID PRN for anxiety. Monitor for upper respiratory symptoms/URI.   Gabriel Earing MD

## 2019-08-01 NOTE — Progress Notes (Signed)
   08/01/19 2020  Psych Admission Type (Psych Patients Only)  Admission Status Voluntary  Psychosocial Assessment  Patient Complaints None  Eye Contact Brief;Watchful  Facial Expression Worried;Anxious  Affect Anxious;Preoccupied  Speech Logical/coherent  Interaction Guarded;Minimal  Motor Activity Other (Comment) (WNL)  Appearance/Hygiene Unremarkable  Behavior Characteristics Cooperative  Mood Depressed  Thought Process  Coherency Circumstantial  Content WDL  Delusions None reported or observed  Perception WDL  Hallucination None reported or observed  Judgment Poor  Confusion None  Danger to Self  Current suicidal ideation? Denies  Danger to Others  Danger to Others None reported or observed   Pt seen in her room. Had covers pulled up to her neck. Pt at first endorsed passive SI. When I asked pt to contract for safety pt stated, "Yeah, but I didn't mean it like that. I said I was having thoughts of hurting myself but I meant that there is a lot going on in my life." Pt does contract for safety.

## 2019-08-01 NOTE — Plan of Care (Addendum)
D: Patient is alert and oriented X3. She denies SI, HI, AVH at this time and reports that her level of anxiety "is better." She reports her mood as "descent" and that she wants to get discharged. When asked of her goals and housing situation she shrugged her shoulders. Pt. denied any sx r/t COVID-19 or any other discomfort at time of assessment.  A: Pt. encouraged to work on her goals for the day. Encouraged to comply with POC including EKG which she stated she will do after lunch. Routine safety checks conducted every 15 minutes. Patient informed to notify staff with problems or concerns.   R: Pt. Remains withdrawn to self but is out for meals. Observed in the dayroom eating snacks and watching TV with minimal interaction with peers. No adverse drug reactions voiced or noted. Patient remains safe with no behavioral/unsafe behavior noted thus far. Patient wearing a mask in unit, receptive, calm, and cooperative with POC. Support offered as needed.  Problem: Education: Goal: Emotional status will improve Outcome: Progressing Goal: Mental status will improve Outcome: Progressing   Problem: Activity: Goal: Sleeping patterns will improve Outcome: Progressing   Problem: Coping: Goal: Ability to verbalize frustrations and anger appropriately will improve Outcome: Progressing   Problem: Safety: Goal: Periods of time without injury will increase Outcome: Progressing   Problem: Nutrition: Goal: Adequate nutrition will be maintained Outcome: Progressing   Problem: Coping: Goal: Level of anxiety will decrease Outcome: Progressing

## 2019-08-01 NOTE — Progress Notes (Signed)
   08/01/19 0000  Psych Admission Type (Psych Patients Only)  Admission Status Voluntary  Psychosocial Assessment  Patient Complaints Suspiciousness  Eye Contact Suspiciousness;Watchful  Facial Expression Anxious;Pensive;Worried  Affect Anxious;Preoccupied  Systems analyst;Soft  Interaction Cautious;Guarded;Minimal;Poor  Motor Activity Slow  Appearance/Hygiene Unremarkable  Behavior Characteristics Cooperative  Mood Suspicious;Anxious  Thought Process  Coherency Blocking  Content Paranoia  Delusions Paranoid  Perception Hallucinations  Hallucination Auditory;Visual  Judgment Poor  Confusion None  Danger to Self  Current suicidal ideation? Denies  Danger to Others  Danger to Others None reported or observed

## 2019-08-02 DIAGNOSIS — F200 Paranoid schizophrenia: Principal | ICD-10-CM

## 2019-08-02 NOTE — Progress Notes (Signed)
   08/02/19 2030  Psych Admission Type (Psych Patients Only)  Admission Status Voluntary  Psychosocial Assessment  Patient Complaints None  Eye Contact Brief;Watchful  Facial Expression Worried;Anxious  Affect Anxious;Preoccupied  Speech Logical/coherent  Interaction Guarded;Minimal  Appearance/Hygiene Unremarkable  Behavior Characteristics Cooperative  Mood Pleasant;Preoccupied  Thought Process  Coherency Circumstantial  Content WDL  Delusions None reported or observed  Perception WDL  Hallucination None reported or observed  Judgment Poor  Confusion None  Danger to Self  Current suicidal ideation? Denies  Danger to Others  Danger to Others None reported or observed

## 2019-08-02 NOTE — Progress Notes (Signed)
[facility] MD Progress Note  08/02/2019 3:00 PM Heather Kramer  MRN:  [id number]  Subjective: Heather Kramer reports, "I came here because I needed peer support system. So, when am I going home?"  Objective: Patient is seen and examined. Patient is a 24 year old female who presented voluntarily to the behavioral health urgent care center on 07/28/2019 with her mother seeking a mental health evaluation. The patient is significantly paranoid this time, and appears to be psychotic. She is not a good historian. She stated that her college was plotting against her because she changed her major multiple times. She was disorganized at the time of the evaluation. According to the information in the chart from the social worker evaluation who collected information from her mother that the patient had endorsed experiencing auditory and visual hallucinations for "years". The mother stated that the patient was hearing her ex-girlfriend say negative and disparaging things about her. The patient denied any suicidal ideation, but did endorse suicidal ideation within the last 7 days while at the [facility]. Heather Kramer is seen, chart reviewed. The chart findings discussed with the treatment team. She presents alert, oriented to name, place & may be situation to some fair extent. She is visible on the unit, attending group sessions. Patient presents today nicely psychotic, actively responding to internal stimuli. Appears to be hearing voices as she is observed listening or rather paying attention to some stimuli & will laugh out loud. She says she has been in the hospital x 5 days. She says she needed peer support system. Then, she asked, when am I going home?Marland Kitchen Although responding to internal stimuli, Heather Kramer is not displaying any behavioral issues. She is taking & tolerating her treatment regimen. She denies any other side effects other than some drowsiness. She is instructed & encouraged to be careful & mindful when going from a sitting,  lying down positions to standing up to avoid feeling light-headed/falls. She currently denies any SIHI. We will continue her current plan of care as already in progress.  Principal Problem: Schizophrenia, paranoid ([facility])  Diagnosis: Principal Problem:   Schizophrenia, paranoid ([facility]) Active Problems:   Psychosis ([facility])  Total Time spent with patient: 15 minutes  Past Psychiatric History: See H&P  Past Medical History:  Past Medical History:  Diagnosis Date  . Acquired acanthosis nigricans   . Allergies   . Goiter   . Goiter   . Overweight(278.02)   . Pre-diabetes   . Thyroiditis, autoimmune     Past Surgical History:  Procedure Laterality Date  . NO PAST SURGERIES     Family History:  Family History  Problem Relation Age of Onset  . Obesity Mother   . Heart disease Father   . Cancer Maternal Grandmother   . Diabetes Maternal Grandfather    Family Psychiatric  History: See H&P  Social History:  Social History   Substance and Sexual Activity  Alcohol Use Yes     Social History   Substance and Sexual Activity  Drug Use Yes  . Types: Marijuana    Social History   Socioeconomic History  . Marital status: Single    Spouse name: Not on file  . Number of children: Not on file  . Years of education: Not on file  . Highest education level: 12th grade  Occupational History  . Not on file  Tobacco Use  . Smoking status: Current Every Day Smoker    Packs/day: 1.00    Types: Cigarettes  . Smokeless tobacco: Never Used  Substance  and Sexual Activity  . Alcohol use: Yes  . Drug use: Yes    Types: Marijuana  . Sexual activity: Yes    Birth control/protection: None  Other Topics Concern  . Not on file  Social History Narrative   Lives with mother and brother. 9th grade at [employer]. Plays basketball.    Social Determinants of Health   Financial Resource Strain:   . Difficulty of Paying Living Expenses:   Food Insecurity:   . Worried  About Programme researcher, broadcasting/film/video in the Last Year:   . Barista in the Last Year:   Transportation Needs:   . Freight forwarder (Medical):   Marland Kitchen Lack of Transportation (Non-Medical):   Physical Activity:   . Days of Exercise per Week:   . Minutes of Exercise per Session:   Stress:   . Feeling of Stress :   Social Connections:   . Frequency of Communication with Friends and Family:   . Frequency of Social Gatherings with Friends and Family:   . Attends Religious Services:   . Active Member of Clubs or Organizations:   . Attends Banker Meetings:   Marland Kitchen Marital Status:    Additional Social History:   Sleep: Good  Appetite:  Good  Current Medications: Current Facility-Administered Medications  Medication Dose Route Frequency Provider Last Rate Last Admin  . acetaminophen (TYLENOL) tablet 650 mg  650 mg Oral Q6H PRN Patrcia Dolly, FNP      . alum & mag hydroxide-simeth (MAALOX/MYLANTA) 200-200-20 MG/5ML suspension 30 mL  30 mL Oral Q4H PRN Patrcia Dolly, FNP      . hydrOXYzine (ATARAX/VISTARIL) tablet 25 mg  25 mg Oral TID PRN Patrcia Dolly, FNP   25 mg at 07/31/19 2014  . magnesium hydroxide (MILK OF MAGNESIA) suspension 30 mL  30 mL Oral Daily PRN Patrcia Dolly, FNP      . risperiDONE (RISPERDAL) tablet 1 mg  1 mg Oral [id number] Cobos, Rockey Situ, MD   1 mg at 08/02/19 6144  . risperiDONE (RISPERDAL) tablet 2 mg  2 mg Oral QHS Antonieta Pert, MD   2 mg at 08/01/19 2036   Lab Results: No results found for this or any previous visit (from the past 48 hour(s)).  Blood Alcohol level:  Lab Results  Component Value Date   ETH <10 07/28/2019   ETH <10 04/25/2018   Metabolic Disorder Labs: Lab Results  Component Value Date   HGBA1C 5.3 07/28/2019   MPG 105.41 07/28/2019   MPG 108 02/19/2014   Lab Results  Component Value Date   PROLACTIN 16.6 07/28/2019   Lab Results  Component Value Date   CHOL 171 07/28/2019   TRIG 68 07/28/2019   HDL 55 07/28/2019    CHOLHDL 3.1 07/28/2019   VLDL 14 07/28/2019   LDLCALC 102 (H) 07/28/2019   LDLCALC 76 02/19/2014   Physical Findings: AIMS: Facial and Oral Movements Muscles of Facial Expression: None, normal Lips and Perioral Area: None, normal Jaw: None, normal Tongue: None, normal,Extremity Movements Upper (arms, wrists, hands, fingers): None, normal Lower (legs, knees, ankles, toes): None, normal, Trunk Movements Neck, shoulders, hips: None, normal, Overall Severity Severity of abnormal movements (highest score from questions above): None, normal Incapacitation due to abnormal movements: None, normal Patient's awareness of abnormal movements (rate only patient's report): No Awareness, Dental Status Current problems with teeth and/or dentures?: No Does patient usually wear dentures?: No  CIWA:  COWS:     Musculoskeletal: Strength & Muscle Tone: within normal limits Gait & Station: normal Patient leans: N/A  Psychiatric Specialty Exam: Physical Exam Vitals and nursing note reviewed.  HENT:     Head: Normocephalic.     Nose: Nose normal.     Mouth/Throat:     Pharynx: Oropharynx is clear.  Eyes:     Pupils: Pupils are equal, round, and reactive to light.  Cardiovascular:     Rate and Rhythm: Normal rate.  Pulmonary:     Effort: Pulmonary effort is normal.  Genitourinary:    Comments: Deferred Musculoskeletal:        General: Normal range of motion.     Cervical back: Normal range of motion.  Skin:    General: Skin is warm and dry.  Neurological:     Mental Status: She is alert and oriented to person, place, and time.     Review of Systems  Constitutional: Negative for chills, diaphoresis and fever.  HENT: Negative for congestion, rhinorrhea, sneezing and sore throat.   Eyes: Negative for discharge.  Respiratory: Negative for cough, chest tightness, shortness of breath and wheezing.   Cardiovascular: Negative for chest pain and palpitations.  Gastrointestinal: Negative for  diarrhea, nausea and vomiting.  Endocrine: Negative for cold intolerance.  Genitourinary: Negative for difficulty urinating.  Musculoskeletal: Negative.   Allergic/Immunologic: Positive for food allergies (Peanut). Negative for environmental allergies.       Allergies: NKDA  Neurological: Negative.   Psychiatric/Behavioral: Positive for dysphoric mood and hallucinations (Patient is actively responding to internal stimuli). Negative for agitation, behavioral problems, confusion, decreased concentration, self-injury, sleep disturbance and suicidal ideas. The patient is not nervous/anxious and is not hyperactive.     Blood pressure 102/74, pulse (!) 101, temperature 98.2 F (36.8 C), temperature source Oral, resp. rate 18, height 6\' 1"  (1.854 m), weight 62.8 kg, SpO2 100 %.Body mass index is 18.27 kg/m.  General Appearance: Disheveled  Eye Contact:  Minimal  Speech:  Normal Rate  Volume:  Decreased  Mood:  Dysphoric  Affect:  Congruent  Thought Process:  Goal Directed and Descriptions of Associations: Loose  Orientation:  Negative  Thought Content:  Delusions and Hallucinations: Auditory, responding to internal stimuli.  Suicidal Thoughts:  No  Homicidal Thoughts:  No  Memory:  Immediate; Fair Recent;   Poor Remote;   Poor  Judgement:  Impaired  Insight:  Lacking  Psychomotor Activity:  Normal  Concentration:  Concentration: Fair and Attention Span: Fair  Recall:  Poor  Fund of Knowledge:  Fair  Language:  Fair  Akathisia:  Negative  Handed:  Right  AIMS (if indicated):     Assets:  Desire for Improvement Resilience  ADL's:  Intact  Cognition:  WNL    Sleep:  Number of Hours: 8.25   Treatment Plan Summary: Daily contact with patient to assess and evaluate symptoms and progress in treatment and Medication management.  - Continue inpatient hospitalization. - Will continue today 08/02/2019 plan as below except where it is noted.  Mood control.   - Continue Risperdal 1 mg po  Q mornings.   - Continue Risperdal 2 mg po Q bedtime.  Anxiety.    - Continue Vistaril 25 mg po tid prn.  - Encourage group participation. - Discharge disposition in progress.  08/04/2019, NP, PMHNP, FNP-BC 08/02/2019, 3:00 PM

## 2019-08-02 NOTE — [facility] Group Notes (Signed)
Occupational Therapy Group Note Date: 08/02/2019 Group Topic/Focus: Socialization/Social Skills  Group Description: Group encouraged increased engagement and participation through discussion/interactive activity focused on socialization and communication. Group members picked from a stack of topics and were challenged to toss a stress ball around in a complete circle while offering a relevant response to identified topic without repeating any answers from peers. Activity focused on use of problem-solving, communication, and social skills.  Participation Level: Active   Participation Quality: Minimal Cues   Behavior: Alert and Interactive   Speech/Thought Process: Disorganized   Affect/Mood: Constricted   Insight: Limited   Judgement: Limited   Individualization: Waleska was active in her participation of discussion/activity, however was notably disorganized and distracted throughout. When prompted, pt offered relevant contributions to topic being discussed, however asked this writer "what is even happening?" when another patient began dancing in the middle of group space. Pleasant upon approach, though notable change in affect as group progressed.   Modes of Intervention: Activity, Discussion, Education and Socialization  Patient Response to Interventions:  Attentive, Disengaged and Engaged   Plan: Continue to engage patient in OT groups 2 - 3x/week.  Donne Hazel, MOT, OTR/L

## 2019-08-02 NOTE — Progress Notes (Signed)
Recreation Therapy Notes  Date: 7.22.21 Time: 1015 Location: [address] Dayroom  Group Topic: Triggers  Goal Area(s) Addresses:  Patient will identify biggest triggers. Patient will identify how to avoid triggers. Patient will identify how to deal with triggers head on.  Behavioral Response: None  Intervention: Worksheet, pencils  Activity: Triggers.  Patients were to identify their 3 biggest triggers.  Patients were to then identify three ways in which those triggers could be avoided and three ways they can be dealt with head on.  Education: Triggers, Discharge Planning  Education Outcome: Acknowledges understanding/In group clarification offered/Needs additional education.   Clinical Observations/Feedback: Pt came in late and was making random comments that didn't make sense.    Caroll Rancher, LRT/CTRS         Lillia Abed, Stein Windhorst A 08/02/2019 11:14 AM

## 2019-08-02 NOTE — Progress Notes (Signed)
DAR NOTE: Patient presents with anxious affect and mood.  Observed responding to internal stimuli.  Patient visible in milieu watching TV, laughing and talking to herself.  Rates depression at 0, hopelessness at 0, and anxiety at 0.  Maintained on routine safety checks.  Medications given as prescribed.  Support and encouragement offered as needed.  Attended group and participated.  States goal for today is "cooling."  Patient is safe on and off the unit. Offered no complaint.

## 2019-08-03 MED ORDER — RISPERIDONE 3 MG PO TABS
3.0000 mg | ORAL_TABLET | Freq: Every day | ORAL | Status: DC
  Administered 2019-08-03 – 2019-08-04 (×2): 3 mg via ORAL
  Filled 2019-08-03 (×4): qty 1

## 2019-08-03 NOTE — Tx Team (Cosign Needed)
Interdisciplinary Treatment and Diagnostic Plan Update  08/03/2019 Time of Session: 9:30am Heather Kramer MRN: [id number]  Principal Diagnosis: Schizophrenia, paranoid ([place])  Secondary Diagnoses: Principal Problem:   Schizophrenia, paranoid ([place]) Active Problems:   Psychosis ([place])   Current Medications:  Current Facility-Administered Medications  Medication Dose Route Frequency Provider Last Rate Last Admin   acetaminophen (TYLENOL) tablet 650 mg  650 mg Oral Q6H PRN Emmaline Kluver, FNP       alum & mag hydroxide-simeth (MAALOX/MYLANTA) 200-200-20 MG/5ML suspension 30 mL  30 mL Oral Q4H PRN Emmaline Kluver, FNP       hydrOXYzine (ATARAX/VISTARIL) tablet 25 mg  25 mg Oral TID PRN Emmaline Kluver, FNP   25 mg at 07/31/19 2014   magnesium hydroxide (MILK OF MAGNESIA) suspension 30 mL  30 mL Oral Daily PRN Emmaline Kluver, FNP       risperiDONE (RISPERDAL) tablet 1 mg  1 mg Oral BH-q7a Cobos, Myer Peer, MD   1 mg at 08/03/19 0631   risperiDONE (RISPERDAL) tablet 3 mg  3 mg Oral QHS Cobos, Myer Peer, MD       PTA Medications: Medications Prior to Admission  Medication Sig Dispense Refill Last Dose   diphenhydrAMINE (BENADRYL) 2 % cream Apply topically 3 (three) times daily as needed for itching.   07/28/2019   PROAIR HFA 108 (90 Base) MCG/ACT inhaler Inhale 2 puffs into the lungs every 4 (four) hours as needed for shortness of breath or wheezing.   Past Week at Unknown time   Vitamin D, Ergocalciferol, (DRISDOL) 50000 UNITS CAPS capsule Take 1 capsule (50,000 Units total) by mouth every 7 (seven) days. (Patient not taking: Reported on 04/25/2018) 12 capsule 0 Unknown at Unknown time    Patient Stressors: Medication change or noncompliance Substance abuse  Patient Strengths: Ability for insight Motivation for treatment/growth  Treatment Modalities: Medication Management, Group therapy, Case management,  1 to 1 session with clinician, Psychoeducation, Recreational  therapy.   Physician Treatment Plan for Primary Diagnosis: Schizophrenia, paranoid ([place]) Long Term Goal(s): Improvement in symptoms so as ready for discharge Improvement in symptoms so as ready for discharge   Short Term Goals: Ability to identify changes in lifestyle to reduce recurrence of condition will improve Ability to verbalize feelings will improve Ability to demonstrate self-control will improve Ability to identify and develop effective coping behaviors will improve Ability to maintain clinical measurements within normal limits will improve Compliance with prescribed medications will improve Ability to identify triggers associated with substance abuse/mental health issues will improve Ability to identify changes in lifestyle to reduce recurrence of condition will improve Ability to verbalize feelings will improve Ability to demonstrate self-control will improve Ability to identify and develop effective coping behaviors will improve Ability to maintain clinical measurements within normal limits will improve Compliance with prescribed medications will improve Ability to identify triggers associated with substance abuse/mental health issues will improve  Medication Management: Evaluate patient's response, side effects, and tolerance of medication regimen.  Therapeutic Interventions: 1 to 1 sessions, Unit Group sessions and Medication administration.  Evaluation of Outcomes: Not Met  Physician Treatment Plan for Secondary Diagnosis: Principal Problem:   Schizophrenia, paranoid ([place]) Active Problems:   Psychosis ([place])  Long Term Goal(s): Improvement in symptoms so as ready for discharge Improvement in symptoms so as ready for discharge   Short Term Goals: Ability to identify changes in lifestyle to reduce recurrence of condition will improve Ability to verbalize feelings will improve Ability to demonstrate self-control  will improve Ability to identify and develop effective  coping behaviors will improve Ability to maintain clinical measurements within normal limits will improve Compliance with prescribed medications will improve Ability to identify triggers associated with substance abuse/mental health issues will improve Ability to identify changes in lifestyle to reduce recurrence of condition will improve Ability to verbalize feelings will improve Ability to demonstrate self-control will improve Ability to identify and develop effective coping behaviors will improve Ability to maintain clinical measurements within normal limits will improve Compliance with prescribed medications will improve Ability to identify triggers associated with substance abuse/mental health issues will improve     Medication Management: Evaluate patient's response, side effects, and tolerance of medication regimen.  Therapeutic Interventions: 1 to 1 sessions, Unit Group sessions and Medication administration.  Evaluation of Outcomes: Not Met   RN Treatment Plan for Primary Diagnosis: Schizophrenia, paranoid ([place]) Long Term Goal(s): Knowledge of disease and therapeutic regimen to maintain health will improve  Short Term Goals: Ability to verbalize feelings will improve, Ability to identify and develop effective coping behaviors will improve and Compliance with prescribed medications will improve  Medication Management: RN will administer medications as ordered by provider, will assess and evaluate patient's response and provide education to patient for prescribed medication. RN will report any adverse and/or side effects to prescribing provider.  Therapeutic Interventions: 1 on 1 counseling sessions, Psychoeducation, Medication administration, Evaluate responses to treatment, Monitor vital signs and CBGs as ordered, Perform/monitor CIWA, COWS, AIMS and Fall Risk screenings as ordered, Perform wound care treatments as ordered.  Evaluation of Outcomes: Not Met   LCSW Treatment  Plan for Primary Diagnosis: Schizophrenia, paranoid ([place]) Long Term Goal(s): Safe transition to appropriate next level of care at discharge, Engage patient in therapeutic group addressing interpersonal concerns.  Short Term Goals: Engage patient in aftercare planning with referrals and resources, Increase ability to appropriately verbalize feelings, Facilitate acceptance of mental health diagnosis and concerns, Identify triggers associated with mental health/substance abuse issues and Increase skills for wellness and recovery  Therapeutic Interventions: Assess for all discharge needs, 1 to 1 time with Social worker, Explore available resources and support systems, Assess for adequacy in community support network, Educate family and significant other(s) on suicide prevention, Complete Psychosocial Assessment, Interpersonal group therapy.  Evaluation of Outcomes: Not Met   Progress in Treatment: Attending groups: Yes. Participating in groups: Yes. Taking medication as prescribed: Yes. Toleration medication: Yes. Family/Significant other contact made: Yes, individual(s) contacted:  mom Patient understands diagnosis: Yes. Discussing patient identified problems/goals with staff: Yes. Medical problems stabilized or resolved: Yes. Denies suicidal/homicidal ideation: Yes. Issues/concerns per patient self-inventory: No. Other:   New problem(s) identified: No, Describe:  none  New Short Term/Long Term Goal(s):  Patient Goals:  Patient did not attend  Discharge Plan or Barriers:   Reason for Continuation of Hospitalization: Hallucinations Medication stabilization  Estimated Length of Stay: 1-3 days   Attendees: Patient: Did not attend 08/03/2019  Physician:  08/03/2019   Nursing:  08/03/2019  RN Care Manager: 08/03/2019   Social Worker: Darletta Moll, LCSW 08/03/2019   Recreational Therapist:  08/03/2019   Other:  08/03/2019   Other:  08/03/2019   Other: 08/03/2019      Scribe for  Treatment Team: Vassie Moselle, [place] 08/03/2019 9:50 AM

## 2019-08-03 NOTE — Progress Notes (Signed)
[facility] MD Progress Note  08/03/2019 9:29 AM Heather Kramer  MRN:  [id number]  Subjective: she reports improvement compared to admission. States that she still experiences intermittent auditory hallucinations staying " might as well ". Denies command hallucinations. Denies medication side effects other than some sedation, denies SI. Hopeful for discharge soon.  Objective: Patient seen and case discussed with treatment team. 24 year old female, presented to hospital on 7/17 describing auditory hallucinations, describing hearing voice of ex SO making disparaging /critical comments, presenting paranoid and with disorganized thought process . She also reported some SI on admission. She reported drinking in binges and regular cannabis use .Admission UDS was positive for cannabis, admission BAL negative.She reported past history of psychosis/hallucinations  Patient reports she is feeling better, does acknowledge persistent , but improved , auditory hallucinations. Denies feeling depressed and states her mood is " good". Denies SI.  Endorses some sedation from medication management but otherwise denies medication side effects On unit patient's behavior has been in good control, without agitated or disruptive outbursts. Limited interaction with peers. Staff has observed patient appears internally preoccupied at times . With patient's express consent I spoke with her mother, who states Heather Kramer appears to be doing better and " more herself ". Mother does not have concerns about discharge planning/patient returning home at discharge. Currently on Risperidone trial- patient endorses some sedation but otherwise tolerating well .  Principal Problem:  psychosis Diagnosis: Principal Problem:   Schizophrenia, paranoid ([facility]) Active Problems:   Psychosis ([facility])  Total Time spent with patient: 15 minutes  Past Psychiatric History: See H&P  Past Medical History:  Past Medical History:  Diagnosis Date  . Acquired  acanthosis nigricans   . Allergies   . Goiter   . Goiter   . Overweight(278.02)   . Pre-diabetes   . Thyroiditis, autoimmune     Past Surgical History:  Procedure Laterality Date  . NO PAST SURGERIES     Family History:  Family History  Problem Relation Age of Onset  . Obesity Mother   . Heart disease Father   . Cancer Maternal Grandmother   . Diabetes Maternal Grandfather    Family Psychiatric  History: See H&P  Social History:  Social History   Substance and Sexual Activity  Alcohol Use Yes     Social History   Substance and Sexual Activity  Drug Use Yes  . Types: Marijuana    Social History   Socioeconomic History  . Marital status: Single    Spouse name: Not on file  . Number of children: Not on file  . Years of education: Not on file  . Highest education level: 12th grade  Occupational History  . Not on file  Tobacco Use  . Smoking status: Current Every Day Smoker    Packs/day: 1.00    Types: Cigarettes  . Smokeless tobacco: Never Used  Substance and Sexual Activity  . Alcohol use: Yes  . Drug use: Yes    Types: Marijuana  . Sexual activity: Yes    Birth control/protection: None  Other Topics Concern  . Not on file  Social History Narrative   Lives with mother and brother. 9th grade at [employer]. Plays basketball.    Social Determinants of Health   Financial Resource Strain:   . Difficulty of Paying Living Expenses:   Food Insecurity:   . Worried About Programme researcher, broadcasting/film/video in the Last Year:   . [employer] of Food in the  Last Year:   Transportation Needs:   . Freight forwarder (Medical):   Marland Kitchen Lack of Transportation (Non-Medical):   Physical Activity:   . Days of Exercise per Week:   . Minutes of Exercise per Session:   Stress:   . Feeling of Stress :   Social Connections:   . Frequency of Communication with Friends and Family:   . Frequency of Social Gatherings with Friends and Family:   . Attends Religious  Services:   . Active Member of Clubs or Organizations:   . Attends Banker Meetings:   Marland Kitchen Marital Status:    Additional Social History:   Sleep: Good  Appetite:  Good  Current Medications: Current Facility-Administered Medications  Medication Dose Route Frequency Provider Last Rate Last Admin  . acetaminophen (TYLENOL) tablet 650 mg  650 mg Oral Q6H PRN Patrcia Dolly, FNP      . alum & mag hydroxide-simeth (MAALOX/MYLANTA) 200-200-20 MG/5ML suspension 30 mL  30 mL Oral Q4H PRN Patrcia Dolly, FNP      . hydrOXYzine (ATARAX/VISTARIL) tablet 25 mg  25 mg Oral TID PRN Patrcia Dolly, FNP   25 mg at 07/31/19 2014  . magnesium hydroxide (MILK OF MAGNESIA) suspension 30 mL  30 mL Oral Daily PRN Patrcia Dolly, FNP      . risperiDONE (RISPERDAL) tablet 1 mg  1 mg Oral BH-q7a Myreon Wimer, Rockey Situ, MD   1 mg at 08/03/19 0631  . risperiDONE (RISPERDAL) tablet 2 mg  2 mg Oral QHS Antonieta Pert, MD   2 mg at 08/02/19 2030   Lab Results: No results found for this or any previous visit (from the past 48 hour(s)).  Blood Alcohol level:  Lab Results  Component Value Date   ETH <10 07/28/2019   ETH <10 04/25/2018   Metabolic Disorder Labs: Lab Results  Component Value Date   HGBA1C 5.3 07/28/2019   MPG 105.41 07/28/2019   MPG 108 02/19/2014   Lab Results  Component Value Date   PROLACTIN 16.6 07/28/2019   Lab Results  Component Value Date   CHOL 171 07/28/2019   TRIG 68 07/28/2019   HDL 55 07/28/2019   CHOLHDL 3.1 07/28/2019   VLDL 14 07/28/2019   LDLCALC 102 (H) 07/28/2019   LDLCALC 76 02/19/2014   Physical Findings: AIMS: Facial and Oral Movements Muscles of Facial Expression: None, normal Lips and Perioral Area: None, normal Jaw: None, normal Tongue: None, normal,Extremity Movements Upper (arms, wrists, hands, fingers): None, normal Lower (legs, knees, ankles, toes): None, normal, Trunk Movements Neck, shoulders, hips: None, normal, Overall Severity Severity of  abnormal movements (highest score from questions above): None, normal Incapacitation due to abnormal movements: None, normal Patient's awareness of abnormal movements (rate only patient's report): No Awareness, Dental Status Current problems with teeth and/or dentures?: No Does patient usually wear dentures?: No  CIWA:    COWS:     Musculoskeletal: Strength & Muscle Tone: within normal limits Gait & Station: normal Patient leans: N/A  Psychiatric Specialty Exam: Physical Exam Vitals and nursing note reviewed.  HENT:     Head: Normocephalic.     Nose: Nose normal.     Mouth/Throat:     Pharynx: Oropharynx is clear.  Eyes:     Pupils: Pupils are equal, round, and reactive to light.  Cardiovascular:     Rate and Rhythm: Normal rate.  Pulmonary:     Effort: Pulmonary effort is normal.  Genitourinary:    Comments:  Deferred Musculoskeletal:        General: Normal range of motion.     Cervical back: Normal range of motion.  Skin:    General: Skin is warm and dry.  Neurological:     Mental Status: She is alert and oriented to person, place, and time.     Review of Systems  Constitutional: Negative for chills, diaphoresis and fever.  HENT: Negative for congestion, rhinorrhea, sneezing and sore throat.   Eyes: Negative for discharge.  Respiratory: Negative for cough, chest tightness, shortness of breath and wheezing.   Cardiovascular: Negative for chest pain and palpitations.  Gastrointestinal: Negative for diarrhea, nausea and vomiting.  Endocrine: Negative for cold intolerance.  Genitourinary: Negative for difficulty urinating.  Musculoskeletal: Negative.   Allergic/Immunologic: Positive for food allergies (Peanut). Negative for environmental allergies.       Allergies: NKDA  Neurological: Negative.   Psychiatric/Behavioral: Positive for dysphoric mood and hallucinations (Patient is actively responding to internal stimuli). Negative for agitation, behavioral problems,  confusion, decreased concentration, self-injury, sleep disturbance and suicidal ideas. The patient is not nervous/anxious and is not hyperactive.     Blood pressure 101/79, pulse 100, temperature 98.2 F (36.8 C), temperature source Oral, resp. rate 18, height 6\' 1"  (1.854 m), weight 62.8 kg, SpO2 100 %.Body mass index is 18.27 kg/m.  General Appearance: fair   Eye Contact:  fair   Speech:  Normal Rate  Volume:  Decreased  Mood:  reports mood is " all right", denies depression  Affect:  less blunted, smiles briefly at times   Thought Process:  Goal Directed and Descriptions of Associations: circumstantial  Orientation:  oriented x 3   Thought Content:  reports improved but persistent auditory hallucinations, no delusions expressed   Suicidal Thoughts:  No denies SI or self injurious ideations  Homicidal Thoughts:  No  Memory:  recent and remote fair   Judgement:  improving  Insight:  fair   Psychomotor Activity:  decreased   Concentration:  Concentration: Fair and Attention Span: Fair  Recall:  fair   Fund of Knowledge:  Fair  Language:  Fair  Akathisia:  Negative  Handed:  Right  AIMS (if indicated):     Assets:  Desire for Improvement Resilience  ADL's:  Intact  Cognition:  WNL    Sleep:  Number of Hours: 10.5    Assessment -  24 year old female, presented to hospital on 7/17 describing auditory hallucinations, describing hearing voice of ex SO making disparaging /critical comments, presenting paranoid and with disorganized thought process . She also reported some SI on admission. She reported drinking in binges and regular cannabis use .Admission UDS was positive for cannabis, admission BAL negative.She reported past history of psychosis/hallucinations.  Presents with partial improvement, which is corroborated by her mother. Does continue to experience some auditory hallucinations. Denies depression or SI, affect remains somewhat blunted /flat . Tolerating Risperidone well,  endorses some sedation.  Treatment Plan Summary: Daily contact with patient to assess and evaluate symptoms and progress in treatment and Medication management. Treatment plan reviewed as below today 7/23. Treatment team working on disposition planning options Increase Risperidone to 1 mgr QAM and 3 mgrs QHS mg for psychosis ( *EKG QTc 434)  Continue Vistaril 25 mg TID PRN for anxiety as needed    8/23, MD,  08/03/2019, 9:29 AM   Patient ID: 08/05/2019, female   DOB: [birth year], 24 y.o.   MRN: [id number]

## 2019-08-03 NOTE — Progress Notes (Signed)
SPIRITUALITY GROUP NOTE  Pt attended spirituality group facilitated by Wilkie Aye, MDIv, BCC.  Group Description:  Group focused on topic of hope.  Patients participated in facilitated discussion around topic, connecting with one another around experiences and definitions for hope.  Group members engaged with visual explorer photos, reflecting on what hope looks like for them today.  Group engaged in discussion around how their definitions of hope are present today in hospital.   Modalities: Psycho-social ed, Adlerian, Narrative, MI  Patient Progress: Present throughout group.  Limited engagement in group topic when prompted.

## 2019-08-03 NOTE — Progress Notes (Signed)
Patient has been up in the dayroom watching tv. Writer spoke with her 1:1 and asked her if she was hearing voices and she denies hearing them. Writer asked why she seems to be talking to herself and she reports that she lives alone and that is what she does. She also reported that her mom dropped her off here. Writer explained that she would be discharged once the doctor feels that she is better. Support given and safety maintained with 15 min checks.

## 2019-08-03 NOTE — Progress Notes (Signed)
   08/03/19 2130  COVID-19 Daily Checkoff  Have you had a fever (temp > 37.80C/100F)  in the past 24 hours?  No  If you have had runny nose, nasal congestion, sneezing in the past 24 hours, has it worsened? No  COVID-19 EXPOSURE  Have you traveled outside the state in the past 14 days? No  Have you been in contact with someone with a confirmed diagnosis of COVID-19 or PUI in the past 14 days without wearing appropriate PPE? No  Have you been living in the same home as a person with confirmed diagnosis of COVID-19 or a PUI (household contact)? No  Have you been diagnosed with COVID-19? No   

## 2019-08-03 NOTE — Progress Notes (Signed)
DAR NOTE: Patient presents with anxious affect and mood.  Denies suicidal thoughts, pain, auditory and visual hallucinations.  Described energy level as normal and concentration as good.  Reports good night sleep.  Rates depression at 0, hopelessness at 0, and anxiety at 0.  Maintained on routine safety checks.  Medications given as prescribed.  Support and encouragement offered as needed.  Patient is safe on and off the unit.  Patient is withdrawn and isolates to her room.  Minimal interaction with staff and peers.    Offered no complaint.

## 2019-08-04 NOTE — [facility] Group Notes (Signed)
  [facility] LCSW Group Therapy Note  Date/Time:  08/04/2019 11:15AM-12:00PM  Type of Therapy and Topic:  Group Therapy:  Self-Care after Hospital Discharge  Participation Level:  Active   Description of Group This process group involved patients discussing how they plan to take care of themselves in a better manner when they get home from the hospital.  The group started with patients listing one healthy and one unhealthy way they took care of themselves prior to hospitalization.  A discussion ensued about the differences in healthy and unhealthy coping skills.  Group members shared ideas about making changes when they return home so that they can stay well and in recovery.  The white board was used to list ideas so that patients can continue to see these ideas throughout the day.  Therapeutic Goals Patient will identify and describe one healthy and one unhealthy coping technique used prior to hospitalization Patient will participate in generating ideas about healthy self-care options when they return to the community Patients will be supportive of one another and receive said support from others Patient will identify one healthy self-care activity to add to his/her post-hospitalization life that can help in recovery  Summary of Patient Progress:  The patient expressed that prior to hospitalization on healthy self-care activity she engaged in was eating healthy, while one unhealthy self-care activity was oversleeping.  Patient's participation in group was minimal, but incisive and humorous when she did participate.   Patient identified cooking as another self-care activity to add in her pursuit of recovery post-discharge.   Therapeutic Modalities Brief Solution-Focused Therapy Motivational Interviewing Psychoeducation   Ambrose Mantle, LCSW 08/04/2019, 12:00pm

## 2019-08-04 NOTE — Progress Notes (Signed)
   08/04/19 1800  Psych Admission Type (Psych Patients Only)  Admission Status Voluntary  Psychosocial Assessment  Eye Contact Brief;Watchful  Facial Expression Anxious  Affect Anxious;Preoccupied  Speech Logical/coherent  Interaction Guarded;Minimal  Motor Activity Restless  Appearance/Hygiene Unremarkable  Behavior Characteristics Cooperative  Mood Anxious  Thought Process  Coherency Circumstantial  Content WDL  Delusions None reported or observed  Perception WDL  Hallucination Auditory  Judgment Poor  Confusion None  Danger to Self  Current suicidal ideation? Denies  Danger to Others  Danger to Others None reported or observed

## 2019-08-04 NOTE — Progress Notes (Signed)
[facility] MD Progress Note  08/04/2019 10:00 AM [facility]  MRN:  [id number]  Subjective:  She reports she is feeling " good".  Denies medication side effects other than some sedation. Denies suicidal ideations . She states she does continue to experience auditory hallucinations but significantly less than before, and states that the voices are " quiet now". She states she is not concerned about these experiences  at this time  Objective: Patient seen and case discussed with treatment team. 24 year old female, presented to [facility] on 7/17 describing auditory hallucinations, describing hearing voice of ex SO making disparaging /critical comments, presenting paranoid and with disorganized thought process . She also reported some SI on admission. She reported drinking in binges and regular cannabis use .Admission UDS was positive for cannabis, admission BAL negative.She reported past history of psychosis/hallucinations  Today patient presents alert, attentive, and oriented x3 . Behavior is calm and in good control. She tends to isolate in her room but is out for meals and has attended some groups. Little interaction with peers . Denies medication side effects- currently on Risperidone which was titrated to 1 mgr QAM and 3 mgrs QHS. Presents polite, vaguely blunted in affect. Answers most questions with mono-syllables or short phrases . Describes mood as " good", denies SI. Endorses persistent auditory hallucinations but reports these are decreased and have become " quiet ".    Principal Problem:  psychosis Diagnosis: Principal Problem:   Schizophrenia, paranoid (HCC) Active Problems:   Psychosis (HCC)  Total Time spent with patient: 20 minutes  Past Psychiatric History: See H&P  Past Medical History:  Past Medical History:  Diagnosis Date  . Acquired acanthosis nigricans   . Allergies   . Goiter   . Goiter   . Overweight(278.02)   . Pre-diabetes   . Thyroiditis, autoimmune     Past  Surgical History:  Procedure Laterality Date  . NO PAST SURGERIES     Family History:  Family History  Problem Relation Age of Onset  . Obesity Mother   . Heart disease Father   . Cancer Maternal Grandmother   . Diabetes Maternal Grandfather    Family Psychiatric  History: See H&P  Social History:  Social History   Substance and Sexual Activity  Alcohol Use Yes     Social History   Substance and Sexual Activity  Drug Use Yes  . Types: Marijuana    Social History   Socioeconomic History  . Marital status: Single    Spouse name: Not on file  . Number of children: Not on file  . Years of education: Not on file  . Highest education level: 12th grade  Occupational History  . Not on file  Tobacco Use  . Smoking status: Current Every Day Smoker    Packs/day: 1.00    Types: Cigarettes  . Smokeless tobacco: Never Used  Substance and Sexual Activity  . Alcohol use: Yes  . Drug use: Yes    Types: Marijuana  . Sexual activity: Yes    Birth control/protection: None  Other Topics Concern  . Not on file  Social History Narrative   Lives with mother and brother. 9th grade at [employer]. Plays basketball.    Social Determinants of Health   Financial Resource Strain:   . Difficulty of Paying Living Expenses:   Food Insecurity:   . Worried About Programme researcher, broadcasting/film/video in the Last Year:   . [employer] of Food in the  Last Year:   Transportation Needs:   . Freight forwarderLack of Transportation (Medical):   Marland Kitchen. Lack of Transportation (Non-Medical):   Physical Activity:   . Days of Exercise per Week:   . Minutes of Exercise per Session:   Stress:   . Feeling of Stress :   Social Connections:   . Frequency of Communication with Friends and Family:   . Frequency of Social Gatherings with Friends and Family:   . Attends Religious Services:   . Active Member of Clubs or Organizations:   . Attends BankerClub or Organization Meetings:   Marland Kitchen. Marital Status:    Additional Social  History:   Sleep: Good  Appetite:  Good  Current Medications: Current Facility-Administered Medications  Medication Dose Route Frequency Provider Last Rate Last Admin  . acetaminophen (TYLENOL) tablet 650 mg  650 mg Oral Q6H PRN Patrcia Dollyate, Tina L, FNP      . alum & mag hydroxide-simeth (MAALOX/MYLANTA) 200-200-20 MG/5ML suspension 30 mL  30 mL Oral Q4H PRN Patrcia Dollyate, Tina L, FNP      . hydrOXYzine (ATARAX/VISTARIL) tablet 25 mg  25 mg Oral TID PRN Patrcia Dollyate, Tina L, FNP   25 mg at 08/03/19 2134  . magnesium hydroxide (MILK OF MAGNESIA) suspension 30 mL  30 mL Oral Daily PRN Patrcia Dollyate, Tina L, FNP      . risperiDONE (RISPERDAL) tablet 1 mg  1 mg Oral BH-q7a Janaiya Beauchesne, Rockey SituFernando A, MD   1 mg at 08/04/19 0612  . risperiDONE (RISPERDAL) tablet 3 mg  3 mg Oral QHS Jarrett Chicoine, Rockey SituFernando A, MD   3 mg at 08/03/19 2134   Lab Results: No results found for this or any previous visit (from the past 48 hour(s)).  Blood Alcohol level:  Lab Results  Component Value Date   ETH <10 07/28/2019   ETH <10 04/25/2018   Metabolic Disorder Labs: Lab Results  Component Value Date   HGBA1C 5.3 07/28/2019   MPG 105.41 07/28/2019   MPG 108 02/19/2014   Lab Results  Component Value Date   PROLACTIN 16.6 07/28/2019   Lab Results  Component Value Date   CHOL 171 07/28/2019   TRIG 68 07/28/2019   HDL 55 07/28/2019   CHOLHDL 3.1 07/28/2019   VLDL 14 07/28/2019   LDLCALC 102 (H) 07/28/2019   LDLCALC 76 02/19/2014   Physical Findings: AIMS: Facial and Oral Movements Muscles of Facial Expression: None, normal Lips and Perioral Area: None, normal Jaw: None, normal Tongue: None, normal,Extremity Movements Upper (arms, wrists, hands, fingers): None, normal Lower (legs, knees, ankles, toes): None, normal, Trunk Movements Neck, shoulders, hips: None, normal, Overall Severity Severity of abnormal movements (highest score from questions above): None, normal Incapacitation due to abnormal movements: None, normal Patient's awareness  of abnormal movements (rate only patient's report): No Awareness, Dental Status Current problems with teeth and/or dentures?: No Does patient usually wear dentures?: No  CIWA:    COWS:     Musculoskeletal: Strength & Muscle Tone: within normal limits Gait & Station: normal Patient leans: N/A  Psychiatric Specialty Exam: Physical Exam Vitals and nursing note reviewed.  HENT:     Head: Normocephalic.     Nose: Nose normal.     Mouth/Throat:     Pharynx: Oropharynx is clear.  Eyes:     Pupils: Pupils are equal, round, and reactive to light.  Cardiovascular:     Rate and Rhythm: Normal rate.  Pulmonary:     Effort: Pulmonary effort is normal.  Genitourinary:    Comments:  Deferred Musculoskeletal:        General: Normal range of motion.     Cervical back: Normal range of motion.  Skin:    General: Skin is warm and dry.  Neurological:     Mental Status: She is alert and oriented to person, place, and time.     Review of Systems  Constitutional: Negative for chills, diaphoresis and fever.  HENT: Negative for congestion, rhinorrhea, sneezing and sore throat.   Eyes: Negative for discharge.  Respiratory: Negative for cough, chest tightness, shortness of breath and wheezing.   Cardiovascular: Negative for chest pain and palpitations.  Gastrointestinal: Negative for diarrhea, nausea and vomiting.  Endocrine: Negative for cold intolerance.  Genitourinary: Negative for difficulty urinating.  Musculoskeletal: Negative.   Allergic/Immunologic: Positive for food allergies (Peanut). Negative for environmental allergies.       Allergies: NKDA  Neurological: Negative.   Psychiatric/Behavioral: Positive for dysphoric mood and hallucinations (Patient is actively responding to internal stimuli). Negative for agitation, behavioral problems, confusion, decreased concentration, self-injury, sleep disturbance and suicidal ideas. The patient is not nervous/anxious and is not hyperactive.   no  headache, no chest pain, no shortness of breath, no vomiting   Blood pressure 107/75, pulse 95, temperature 97.9 F (36.6 C), temperature source Oral, resp. rate 18, height 6\' 1"  (1.854 m), weight 62.8 kg, SpO2 100 %.Body mass index is 18.27 kg/m.  General Appearance: casual   Eye Contact:  fair   Speech:  Normal Rate- answers questions with monosyllables or short phrases   Volume:  Decreased  Mood:  reports mood is " good", denies depression  Affect:  blunted  Thought Process:  Goal Directed and Descriptions of Associations: circumstantial  Orientation:  oriented x 3   Thought Content:  reports improved but persistent auditory hallucinations, no delusions expressed   Suicidal Thoughts:  No denies SI or self injurious ideations  Homicidal Thoughts:  No  Memory:  recent and remote fair   Judgement:  improving  Insight:  fair   Psychomotor Activity:  decreased   Concentration:  Concentration: Fair and Attention Span: Fair  Recall:  fair   Fund of Knowledge:  Fair  Language:  Fair  Akathisia:  Negative  Handed:  Right  AIMS (if indicated):     Assets:  Desire for Improvement Resilience  ADL's:  Intact  Cognition:  WNL    Sleep:  Number of Hours: 6.75    Assessment -  24 year old female, presented to [facility] on 7/17 describing auditory hallucinations, describing hearing voice of ex SO making disparaging /critical comments, presenting paranoid and with disorganized thought process . She also reported some SI on admission. She reported drinking in binges and regular cannabis use .Admission UDS was positive for cannabis, admission BAL negative.She reported past history of psychosis/hallucinations.  Patient continues to present with gradual/partial improvement. No agitated or disruptive behaviors noted, denies feeling depressed, denies SI. She does endorse persistent/ongoing auditory hallucinations but reports they have become quieter and does not seem distressed or concerned about  them at this time. Denies medication side effects and has tolerated Risperidone titration well .  Treatment Plan Summary: Daily contact with patient to assess and evaluate symptoms and progress in treatment and Medication management. Treatment plan reviewed as below today 7/24 Treatment team working on disposition planning options Increase Risperidone to 1 mgr QAM and 3 mgrs QHS mg for psychosis ( *EKG QTc 434)  Continue Vistaril 25 mg TID PRN for anxiety as needed  Craige Cotta, MD,  08/04/2019, 10:00 AM   Patient ID: [facility], female   DOB: Jul 21, 1995, 24 y.o.   MRN: [id number]

## 2019-08-05 MED ORDER — RISPERIDONE 3 MG PO TABS: 3.0000 mg | ORAL_TABLET | Freq: Every day | ORAL | 0 refills | Status: DC

## 2019-08-05 MED ORDER — RISPERIDONE 1 MG PO TABS: 1.0000 mg | ORAL_TABLET | ORAL | 0 refills | Status: DC

## 2019-08-05 MED ORDER — HYDROXYZINE HCL 25 MG PO TABS: 25.0000 mg | ORAL_TABLET | Freq: Three times a day (TID) | ORAL | 0 refills | Status: AC | PRN

## 2019-08-05 NOTE — Progress Notes (Signed)
[facility] NOVEL CORONAVIRUS (COVID-19) DAILY CHECK-OFF SYMPTOMS - answer yes or no to each - every day NO YES  Have you had a fever in the past 24 hours?  . Fever (Temp > 37.80C / 100F) X   Have you had any of these symptoms in the past 24 hours? . New Cough .  Sore Throat  .  Shortness of Breath .  Difficulty Breathing .  Unexplained Body Aches   X   Have you had any one of these symptoms in the past 24 hours not related to allergies?   . Runny Nose .  Nasal Congestion .  Sneezing   X   If you have had runny nose, nasal congestion, sneezing in the past 24 hours, has it worsened?  X   EXPOSURES - check yes or no X   Have you traveled outside the state in the past 14 days?  X   Have you been in contact with someone with a confirmed diagnosis of COVID-19 or PUI in the past 14 days without wearing appropriate PPE?  X   Have you been living in the same home as a person with confirmed diagnosis of COVID-19 or a PUI (household contact)?    X   Have you been diagnosed with COVID-19?    X              What to do next: Answered NO to all: Answered YES to anything:   Proceed with unit schedule Follow the [facility] Inpatient Flowsheet.   [name] K. Ainsley Deakins MSN, RN, [facility] [phone] 

## 2019-08-05 NOTE — Progress Notes (Signed)
  [facility] Adult Case Management Discharge Plan :  Will you be returning to the same living situation after discharge:  Yes,  lives alone At discharge, do you have transportation home?: Yes,  no barriers Do you have the ability to pay for your medications: Yes,  Medicaid  Release of information consent forms completed and emailed to Medical Records, then turned in to Medical Records by CSW.   Patient to Follow up at:  Follow-up Information    [facility] Follow up on 08/15/2019.   Specialty: Behavioral Health Why: You have an appointment for therapy on 08/15/19 at 4:00 pm. You also have an appointment for medication management on 09/05/19 at 9:00 am.  These are Virtual appointments and will be held via MyChart. Contact information: [address] [place] [address] [phone]              Next level of care provider has access to [facility] Link:yes  Safety Planning and Suicide Prevention discussed: Yes,  with mother  Have you used any form of tobacco in the last 30 days? (Cigarettes, Smokeless Tobacco, Cigars, and/or Pipes): Yes  Has patient been referred to the Quitline?: Patient refused referral  Patient has been referred for addiction treatment: Yes  Lynnell Chad, LCSW 08/05/2019, 12:44 PM

## 2019-08-05 NOTE — [facility] Group Notes (Signed)
[facility] Group Notes: (Clinical Social Work)   08/05/2019      Type of Therapy:  Group Therapy   Participation Level:  Did Not Attend - was invited both individually by MHT and by overhead announcement, chose not to attend.  By the end of group she had joined the room about 5 minutes earlier and listened to the last song without comment.   Ambrose Mantle, LCSW 08/05/2019, 3:29 PM

## 2019-08-05 NOTE — Progress Notes (Signed)
Discharge:  Pt is a black female  of  24 y.o. , DOB 11/12/1995 , MRN  [id number]   Pt is alert and oriented X 4. Pt given AVS packet to include medication information update, future appointment information, and other resource information. Pt given all belongings and signature obtained. Pt discharged to private resident with family member. Transportation provided by family member.     Einar Crow. Melvyn Neth MSN, RN, [facility] [phone]

## 2019-08-05 NOTE — [facility] Suicide Risk Assessment (Signed)
[facility] Discharge Suicide Risk Assessment   Principal Problem: Schizophrenia, paranoid (HCC) Discharge Diagnoses: Principal Problem:   Schizophrenia, paranoid (HCC) Active Problems:   Psychosis (HCC)   Total Time spent with patient: 30 minutes  Musculoskeletal: Strength & Muscle Tone: within normal limits Gait & Station: normal Patient leans: N/A  Psychiatric Specialty Exam: Review of Systems no headache, no chest pain, no shortness of breath, no vomiting, no rash   Blood pressure 103/80, pulse (!) 107, temperature 98.2 F (36.8 C), temperature source Oral, resp. rate 18, height 6\' 1"  (1.854 m), weight 62.8 kg, SpO2 100 %.Body mass index is 18.27 kg/m.  General Appearance: Fairly Groomed  ::  Fair  Speech:  Normal Rate- answers most questions with brief answers or monosyllables  Volume:  Decreased  Mood:  reports mood as "OK" and states mood 8/10 with 10 being best   Affect:  remains blunted  Thought Process:  Linear and Descriptions of Associations: Circumstantial  Orientation:  Full (Time, Place, and Person)  Thought Content:  reports some persistent, but overall improved, auditory hallucinations.    Suicidal Thoughts:  No denies suicidal or self injurious ideations, denies homicidal or violent ideations  Homicidal Thoughts:  No  Memory:  recent and remote fair   Judgement:  Fair/ improving  Insight:  Fair  Psychomotor Activity:  Decreased  Concentration:  Fair  Recall:  Good  Fund of Knowledge:Fair  Language: Fair  Akathisia:  No  Handed:  Right  AIMS (if indicated):     Assets:  Desire for Improvement Resilience Social Support  Sleep:  Number of Hours: 6.5  Cognition: WNL  ADL's:  Intact   Mental Status Per Nursing Assessment::   On Admission:  Self-harm behaviors  Demographic Factors:  23, single, no children, lives alone  Loss Factors: Substance abuse, had recently travelled out of state to seek employment but states did not get a  job  Historical Factors: She reports history of chronic hallucinations, starting about three years ago. Cannabis use disorder  Risk Reduction Factors:   Sense of responsibility to family, Positive social support and Positive coping skills or problem solving skills  Continued Clinical Symptoms:  Patient presents alert, attentive, with improving grooming . Eye contact is fair . Behavior is calm and in good control. Denies feeling depressed, and states her mood is 8/10 with 10 being best . Affect remains blunted. Denies suicidal or self injurious ideations, denies homicidal or violent ideations. Reports chronic auditory hallucinations x about 3 years. States hallucinations have improved but not resolved, and intermittently hears a voice saying " you can". Denies command hallucinations. Does not currently present internally preoccupied. No delusions are expressed at this time. She is oriented x 3.  She denies medication side effects other than some sedation. With her express consent I spoke with her mother on the phone. Mother states patient presents noticeably improved and closer to her baseline, states " she can have full conversations with me on the phone now". Mother states she feels patient is ready for discharge. States that her and an aunt live nearby patient and will stay with her over the next several days for added support . No disruptive or agitated behaviors on unit. We reviewed the negative impact that substances/cannabis could be having on her level of functioning and symptoms . Encouraged abstinence.  Cognitive Features That Contribute To Risk:  No gross cognitive deficits noted upon discharge. Is alert , attentive, and oriented x 3   Suicide Risk:  Mild:  Suicidal ideation of limited frequency, intensity, duration, and specificity.  There are no identifiable plans, no associated intent, mild dysphoria and related symptoms, good self-control (both objective and subjective assessment),  few other risk factors, and identifiable protective factors, including available and accessible social support.   Follow-up Information    [facility] Follow up on 08/15/2019.   Specialty: Behavioral Health Why: You have an appointment for therapy on 08/15/19 at 4:00 pm. You also have an appointment for medication management on 09/05/19 at 9:00 am.  These are Virtual appointments and will be held via MyChart. Contact information: [address] [place] [address] [phone]              Plan Of Care/Follow-up recommendations:  Activity:  as tolerated  Diet:  regular Tests:  NA Other:  See below  Patient is expressing readiness for discharge and there are no current grounds for involuntary commitment . She is planning to return home and plans to follow up as above Mother will pick her up later today.   Craige Cotta, MD 08/05/2019, 10:42 AM

## 2019-08-05 NOTE — Progress Notes (Signed)
  Pt is Black female  of 24 y.o. , DOB [birth year], MRN  [id number]  Presents Voluntary with Roberts Gaudy,  Hx of Schizophrenia, polysubstance abuse.    Patient Self Inventory Sheet  reports good appetite, energy normal, and fair sleeping pattern without mediaction. Pt rates  depression 9 out of 10, hopelessness 0 out of 10, and anxiety at 0 out of 10. Pt reports SI sometimes without  plans. Denies  HI or AVH.  Pt reports LBM today, no physical problems reported or  pain. Vitals signs WNL and being monitored. Pt states most important goals are to work with doctors and social workers for discharge.   Medication given as Rx, no adverse reaction observed, safety maintained with q15 minute checks.   Einar Crow. Melvyn Neth MSN, RN, [facility] [phone]

## 2019-08-06 NOTE — Discharge Summary (Addendum)
Physician Discharge Summary Note  Patient:  Heather Kramer is an 24 y.o., female MRN:  [id number] DOB:  19-Feb-1995 Patient phone:  [phone] (home)  Patient address:   [address] [place] [address],  Total Time spent with patient: 30 minutes  Date of Admission:  07/28/2019 Date of Discharge: 08/05/2019  Reason for Admission: Patient is a 24 year old female who presented voluntarily to the behavioral health urgent care center on 07/28/2019 with her mother seeking a mental health evaluation.  The patient is significantly paranoid this time, and appears to be psychotic. She was admitted for further evaluation and stabilization.  Principal Problem: Schizophrenia, paranoid [facility]) Discharge Diagnoses: Principal Problem:   Schizophrenia, paranoid (HCC) Active Problems:   Psychosis (HCC)   Past Psychiatric History: Patient was apparently seen at the [facility] 7 days ago.  Prior to that apparently she had not had any psychiatric evaluations.  She has been using marijuana excessively.  Mother stated that the patient had been experiencing auditory and visual hallucinations for "years.  Past Medical History:  Past Medical History:  Diagnosis Date  . Acquired acanthosis nigricans   . Allergies   . Goiter   . Goiter   . Overweight(278.02)   . Pre-diabetes   . Thyroiditis, autoimmune     Past Surgical History:  Procedure Laterality Date  . NO PAST SURGERIES     Family History:  Family History  Problem Relation Age of Onset  . Obesity Mother   . Heart disease Father   . Cancer Maternal Grandmother   . Diabetes Maternal Grandfather    Family Psychiatric  History: Patient denied Social History:  Social History   Substance and Sexual Activity  Alcohol Use Yes     Social History   Substance and Sexual Activity  Drug Use Yes  . Types: Marijuana    Social History   Socioeconomic History  . Marital status: Single    Spouse name: Not on file  . Number of children:  Not on file  . Years of education: Not on file  . Highest education level: 12th grade  Occupational History  . Not on file  Tobacco Use  . Smoking status: Current Every Day Smoker    Packs/day: 1.00    Types: Cigarettes  . Smokeless tobacco: Never Used  Substance and Sexual Activity  . Alcohol use: Yes  . Drug use: Yes    Types: Marijuana  . Sexual activity: Yes    Birth control/protection: None  Other Topics Concern  . Not on file  Social History Narrative   Lives with mother and brother. 9th grade at [employer]. Plays basketball.    Social Determinants of Health   Financial Resource Strain:   . Difficulty of Paying Living Expenses:   Food Insecurity:   . Worried About Programme researcher, broadcasting/film/video in the Last Year:   . Barista in the Last Year:   Transportation Needs:   . Freight forwarder (Medical):   Marland Kitchen Lack of Transportation (Non-Medical):   Physical Activity:   . Days of Exercise per Week:   . Minutes of Exercise per Session:   Stress:   . Feeling of Stress :   Social Connections:   . Frequency of Communication with Friends and Family:   . Frequency of Social Gatherings with Friends and Family:   . Attends Religious Services:   . Active Member of Clubs or Organizations:   . Attends Banker  Meetings:   Marland Kitchen Marital Status:     Hospital Course:  Patient is a 24 year old female with the above-stated past psychiatric history who was admitted to the hospital secondary to new onset psychosis.  She will be admitted to the hospital.  We started her on Risperdal 1 mg p.o. daily. She had available hydroxyzine for anxiety and trazodone for sleep. Review of her admission laboratories revealed essentially normal electrolytes including liver function enzymes.  Lipid panel was normal.  CBC was normal.  Her hemoglobin A1c was 5.3.  Pregnancy test was negative.  TSH was 0.684.  Blood alcohol was less than 10, drug screen was positive for marijuana.   EKG revealed an abnormal atrial tachycardia, but QTc interval was within normal limits.  Following day patient remains significantly paranoid.  During evaluation the patient she was more focused on putting on underarm deodorant, but remain very vigilant and was looking around in a paranoid manner.  She did state that the auditory hallucinations had decreased.  She was very nonverbal.  Review of her admission laboratories showed essentially normal electrolytes, normal lipid panel, essentially normal CBC.  Her TSH was low normal at 0.684.  Pregnancy test was negative.  Drug screen was positive for marijuana.  She is very dysphoric, and we will have to consider the addition of mood stabilization versus antidepressant medicine if we do not see significant improvement with antipsychotic medication alone.   We changed Risperidone to 2 mgrs QHS ( from 1 mgr BID) to decrease morning sedation. Continue Vistaril 25 mgrs Q 8 hours PRN for anxiety as needed.  Added 2 mg Risperidone 2 mg in the evening for psychosis.  Next day due to no significant change in her condition increased Risperidone to 1 mgr QAM and 3 mgrs QHS mg for psychosis ( *EKG QTc 434) . Continued Vistaril 25 mg TID PRN for anxiety as needed .  On the day of discharge- patient presents alert, attentive, with improving grooming . Eye contact is fair . Behavior is calm and in good control. Denies feeling depressed, and states her mood is 8/10 with 10 being best . Affect remains blunted. Denies suicidal or self injurious ideations, denies homicidal or violent ideations. Reports chronic auditory hallucinations x about 3 years. States hallucinations have improved but not resolved, and intermittently hears a voice saying " you can". Denies command hallucinations. Does not currently present internally preoccupied. No delusions are expressed at this time. She is oriented x 3.  She denies medication side effects other than some sedation. With her express  consent  spoked with her mother on the phone. Mother states patient presents noticeably improved and closer to her baseline, states " she can have full conversations with me on the phone now". Mother states she feels patient is ready for discharge. States that her and an aunt live nearby patient and will stay with her over the next several days for added support . No disruptive or agitated behaviors on unit. We reviewed the negative impact that substances/cannabis could be having on her level of functioning and symptoms . Encouraged abstinence.   Physical Findings: AIMS: Facial and Oral Movements Muscles of Facial Expression: None, normal Lips and Perioral Area: None, normal Jaw: None, normal Tongue: None, normal,Extremity Movements Upper (arms, wrists, hands, fingers): None, normal Lower (legs, knees, ankles, toes): None, normal, Trunk Movements Neck, shoulders, hips: None, normal, Overall Severity Severity of abnormal movements (highest score from questions above): None, normal Incapacitation due to abnormal movements: None, normal Patient's awareness  of abnormal movements (rate only patient's report): No Awareness, Dental Status Current problems with teeth and/or dentures?: No Does patient usually wear dentures?: No  CIWA:    COWS:     Musculoskeletal: Strength & Muscle Tone: within normal limits Gait & Station: normal Patient leans: N/A  Psychiatric Specialty Exam: Physical Exam Neurological:     General: No focal deficit present.     Mental Status: She is oriented to person, place, and time.  Psychiatric:        Behavior: Behavior normal.     Review of Systems  Blood pressure 103/80, pulse (!) 107, temperature 98.2 F (36.8 C), temperature source Oral, resp. rate 18, height 6\' 1"  (1.854 m), weight 62.8 kg, SpO2 100 %.Body mass index is 18.27 kg/m.  General Appearance: Fairly Groomed  Eye Contact:  Fair  Speech:  Normal Rate  Volume:  Decreased  Mood:  Okay  Affect:   Blunt  Thought Process:  Linear and Descriptions of Associations: Circumstantial  Orientation:  Full (Time, Place, and Person)  Thought Content:  WDL  Suicidal Thoughts:  No  Homicidal Thoughts:  No  Memory:  Immediate;   Fair Recent;   Fair  Judgement:  Fair  Insight:  Fair  Psychomotor Activity:  Decreased  Concentration:  Concentration: Fair and Attention Span: Fair  Recall:  Good  Fund of Knowledge:  Fair  Language:  Fair  Akathisia:  No  Handed:  Right  AIMS (if indicated):     Assets:  Desire for Improvement Resilience Social Support  ADL's:  Intact  Cognition:  WNL  Sleep:  Number of Hours: 6.5     Have you used any form of tobacco in the last 30 days? (Cigarettes, Smokeless Tobacco, Cigars, and/or Pipes): Yes  Has this patient used any form of tobacco in the last 30 days? (Cigarettes, Smokeless Tobacco, Cigars, and/or Pipes) N/A  Blood Alcohol level:  Lab Results  Component Value Date   ETH <10 07/28/2019   ETH <10 04/25/2018    Metabolic Disorder Labs:  Lab Results  Component Value Date   HGBA1C 5.3 07/28/2019   MPG 105.41 07/28/2019   MPG 108 02/19/2014   Lab Results  Component Value Date   PROLACTIN 16.6 07/28/2019   Lab Results  Component Value Date   CHOL 171 07/28/2019   TRIG 68 07/28/2019   HDL 55 07/28/2019   CHOLHDL 3.1 07/28/2019   VLDL 14 07/28/2019   LDLCALC 102 (H) 07/28/2019   LDLCALC 76 02/19/2014    See Psychiatric Specialty Exam and Suicide Risk Assessment completed by Attending Physician prior to discharge.  Discharge destination:  Home  Is patient on multiple antipsychotic therapies at discharge:  No   Has Patient had three or more failed trials of antipsychotic monotherapy by history:  No  Recommended Plan for Multiple Antipsychotic Therapies: NA  Discharge Instructions    Discharge patient   Complete by: As directed    Discharge disposition: 01-Home or Self Care   Discharge patient date: 08/05/2019     Allergies  as of 08/05/2019      Reactions   Peanut-containing Drug Products Anaphylaxis      Medication List    TAKE these medications     Indication  diphenhydrAMINE 2 % cream Commonly known as: BENADRYL Apply topically 3 (three) times daily as needed for itching.  Indication: Itching at the Site of an Injection   hydrOXYzine 25 MG tablet Commonly known as: ATARAX/VISTARIL Take 1 tablet (25 mg total)  by mouth 3 (three) times daily as needed for up to 7 days for anxiety.  Indication: Feeling Anxious   ProAir HFA 108 (90 Base) MCG/ACT inhaler Generic drug: albuterol Inhale 2 puffs into the lungs every 4 (four) hours as needed for shortness of breath or wheezing.  Indication: Asthma   risperiDONE 3 MG tablet Commonly known as: RISPERDAL Take 1 tablet (3 mg total) by mouth at bedtime for 14 days.  Indication: Schizophrenia   risperiDONE 1 MG tablet Commonly known as: RISPERDAL Take 1 tablet (1 mg total) by mouth every morning for 14 days.  Indication: Schizophrenia   Vitamin D (Ergocalciferol) 1.25 MG (50000 UNIT) Caps capsule Commonly known as: DRISDOL Take 1 capsule (50,000 Units total) by mouth every 7 (seven) days.  Indication: Vitamin D Deficiency       Follow-up Information    [facility]   Specialty: Behavioral Health Why: You have an appointment for therapy on 08/15/19 at 4:00 pm. You also have an appointment for medication management on 09/05/19 at 9:00 am.  These are Virtual appointments and will be held via MyChart. Contact information: [address] [place] [phone]              Follow-up recommendations:  Activity:  Normal Diet:  Normal  Comments:   Prescriptions given at discharge. Patient agreeable to plan.Given opportunity to ask questions. Appears to feel comfortable with discharge denies any current suicidal or homicidal thought. Patient is also instructed prior to discharge to:  Take all medications as prescribed by his/her mental healthcare provider. Report any adverse effects and or reactions from the medicines to his/her outpatient provider promptly. Patient has been instructed & cautioned: To not engage in alcohol and or illegal drug use while on prescription medicines. In the event of worsening symptoms, patient is instructed to call the crisis hotline, 911 and or go to the nearest ED for appropriate evaluation and treatment of symptoms. To follow-up with his/her primary care provider for your other medical issues, concerns and or health care needs.   Signed: Arnoldo LenisAnjali  Dagar, MD 08/06/2019, 12:59 PM  Patient seen, Suicide Assessment Completed.  Disposition Plan Reviewed

## 2019-08-15 ENCOUNTER — Other Ambulatory Visit: Payer: Self-pay

## 2019-08-15 ENCOUNTER — Ambulatory Visit (HOSPITAL_COMMUNITY): Payer: Medicaid Other | Admitting: Licensed Clinical Social Worker

## 2019-08-15 ENCOUNTER — Telehealth (HOSPITAL_COMMUNITY): Payer: Self-pay | Admitting: Licensed Clinical Social Worker

## 2019-08-15 NOTE — Telephone Encounter (Signed)
LCSW sent link via text for scheduled video session. When there was no response LCSW phoned pt number on file. Phone rang 3 times then was disconnected. LCSW called back, phone rang and went to VM. Clinician left vm regarding video session, left phone  # for [facility] for pt to call back and reschedule as desired.

## 2019-09-05 ENCOUNTER — Telehealth (HOSPITAL_COMMUNITY): Payer: Medicaid Other | Admitting: Psychiatry

## 2019-09-05 ENCOUNTER — Other Ambulatory Visit: Payer: Self-pay

## 2019-10-29 ENCOUNTER — Other Ambulatory Visit: Payer: Self-pay

## 2019-10-29 ENCOUNTER — Ambulatory Visit (HOSPITAL_COMMUNITY)
Admission: EM | Admit: 2019-10-29 | Discharge: 2019-10-29 | Disposition: A | Discharge status: Home / Self Care | Payer: Medicaid Other

## 2019-12-13 ENCOUNTER — Encounter (HOSPITAL_COMMUNITY): Payer: Self-pay | Admitting: Emergency Medicine

## 2019-12-13 ENCOUNTER — Emergency Department (HOSPITAL_COMMUNITY)
Admission: EM | Admit: 2019-12-13 | Discharge: 2019-12-17 | Disposition: A | Discharge status: Other Acute Inpatient Hospital | Payer: Medicaid Other | Attending: Emergency Medicine | Admitting: Emergency Medicine

## 2019-12-13 DIAGNOSIS — R45851 Suicidal ideations: Secondary | ICD-10-CM | POA: Diagnosis not present

## 2019-12-13 DIAGNOSIS — F99 Mental disorder, not otherwise specified: Secondary | ICD-10-CM | POA: Insufficient documentation

## 2019-12-13 DIAGNOSIS — F1290 Cannabis use, unspecified, uncomplicated: Secondary | ICD-10-CM | POA: Insufficient documentation

## 2019-12-13 DIAGNOSIS — F200 Paranoid schizophrenia: Secondary | ICD-10-CM | POA: Insufficient documentation

## 2019-12-13 DIAGNOSIS — Z91010 Allergy to peanuts: Secondary | ICD-10-CM | POA: Insufficient documentation

## 2019-12-13 DIAGNOSIS — Z20822 Contact with and (suspected) exposure to covid-19: Secondary | ICD-10-CM | POA: Diagnosis not present

## 2019-12-13 DIAGNOSIS — F17210 Nicotine dependence, cigarettes, uncomplicated: Secondary | ICD-10-CM | POA: Diagnosis not present

## 2019-12-13 DIAGNOSIS — R456 Violent behavior: Secondary | ICD-10-CM | POA: Diagnosis not present

## 2019-12-13 HISTORY — DX: Schizophrenia, unspecified: F20.9

## 2019-12-13 LAB — RAPID URINE DRUG SCREEN, HOSP PERFORMED
Amphetamines: NOT DETECTED
Barbiturates: NOT DETECTED
Benzodiazepines: NOT DETECTED
Cocaine: NOT DETECTED
Opiates: NOT DETECTED
Tetrahydrocannabinol: POSITIVE — AB

## 2019-12-13 LAB — CBC
HCT: 38.6 % (ref 36.0–46.0)
Hemoglobin: 13 g/dL (ref 12.0–15.0)
MCH: 30.1 pg (ref 26.0–34.0)
MCHC: 33.7 g/dL (ref 30.0–36.0)
MCV: 89.4 fL (ref 80.0–100.0)
Platelets: 288 10*3/uL (ref 150–400)
RBC: 4.32 MIL/uL (ref 3.87–5.11)
RDW: 12.7 % (ref 11.5–15.5)
WBC: 6.4 10*3/uL (ref 4.0–10.5)
nRBC: 0 % (ref 0.0–0.2)

## 2019-12-13 LAB — I-STAT BETA HCG BLOOD, ED (MC, WL, AP ONLY): I-stat hCG, quantitative: <5 m[IU]/mL (ref <5)

## 2019-12-13 LAB — COMPREHENSIVE METABOLIC PANEL
ALT: 14 U/L (ref 0–44)
AST: 24 U/L (ref 15–41)
Albumin: 3.8 g/dL (ref 3.5–5.0)
Alkaline Phosphatase: 48 U/L (ref 38–126)
Anion gap: 9 (ref 5–15)
BUN: 8 mg/dL (ref 6–20)
CO2: 23 mmol/L (ref 22–32)
Calcium: 8.7 mg/dL — ABNORMAL LOW (ref 8.9–10.3)
Chloride: 105 mmol/L (ref 98–111)
Creatinine, Ser: 0.86 mg/dL (ref 0.44–1.00)
GFR, Estimated: >60 mL/min (ref >60)
Glucose, Bld: 99 mg/dL (ref 70–99)
Potassium: 3.3 mmol/L — ABNORMAL LOW (ref 3.5–5.1)
Sodium: 137 mmol/L (ref 135–145)
Total Bilirubin: 0.4 mg/dL (ref 0.3–1.2)
Total Protein: 6.9 g/dL (ref 6.5–8.1)

## 2019-12-13 LAB — SALICYLATE LEVEL: Salicylate Lvl: <7 mg/dL — ABNORMAL LOW (ref 7.0–30.0)

## 2019-12-13 LAB — ETHANOL: Alcohol, Ethyl (B): <10 mg/dL (ref <10)

## 2019-12-13 LAB — ACETAMINOPHEN LEVEL: Acetaminophen (Tylenol), Serum: <10 ug/mL — ABNORMAL LOW (ref 10–30)

## 2019-12-13 NOTE — ED Triage Notes (Addendum)
Patient arrived with 2 [employer] officers IVC due to aggressive  behavior , breaking family's properties , hallucinations,suicidal  and non compliant with medications . Unable to focus / poor historian at triage .

## 2019-12-13 NOTE — ED Provider Notes (Signed)
[facility] EMERGENCY DEPARTMENT Provider Note   CSN: [id number] Arrival date & time: 12/13/19  1946     History Chief Complaint  Patient presents with  . IVC- Agressive/Hallucinations    Heather Kramer is a 24 y.o. female.  The history is provided by the patient.  Mental Health Problem Presenting symptoms: aggressive behavior and suicidal thoughts   Degree of incapacity (severity):  Mild Onset quality:  Gradual Timing:  Intermittent Context: noncompliance   Relieved by:  Nothing Worsened by:  Nothing Associated symptoms: no abdominal pain and no chest pain   Risk factors: hx of mental illness        Past Medical History:  Diagnosis Date  . Acquired acanthosis nigricans   . Allergies   . Goiter   . Goiter   . Overweight(278.02)   . Pre-diabetes   . Schizophrenia (HCC)   . Thyroiditis, autoimmune     Patient Active Problem List   Diagnosis Date Noted  . Schizophrenia, paranoid (HCC) 08/02/2019  . Psychosis (HCC) 07/29/2019  . Goiter   . Thyroiditis, autoimmune   . Acquired acanthosis nigricans   . 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 Goiter, unspecified 06/26/2010  . Pre-diabetes 06/26/2010    Past Surgical History:  Procedure Laterality Date  . NO PAST SURGERIES       OB History   No obstetric history on file.     Family History  Problem Relation Age of Onset  . Obesity Mother   . Heart disease Father   . Cancer Maternal Grandmother   . Diabetes Maternal Grandfather     Social History   Tobacco Use  . Smoking status: Current Every Day Smoker    Packs/day: 1.00    Types: Cigarettes  . Smokeless tobacco: Never Used  Substance Use Topics  . Alcohol use: Yes  . Drug use: Yes    Types: Marijuana    Home Medications Prior to Admission medications   Medication Sig Start Date End Date Taking? Authorizing Provider  diphenhydrAMINE (BENADRYL) 2 % cream Apply topically 3 (three) times daily as needed for itching.     [provider]  PROAIR HFA 108 [id number] Base) MCG/ACT inhaler Inhale 2 puffs into the lungs every 4 (four) hours as needed for shortness of breath or wheezing. 11/04/17   [provider]  risperiDONE (RISPERDAL) 1 MG tablet Take 1 tablet (1 mg total) by mouth every morning for 14 days. 08/06/19 08/20/19  Dagar, Geralynn Rile, MD  risperiDONE (RISPERDAL) 3 MG tablet Take 1 tablet (3 mg total) by mouth at bedtime for 14 days. 08/05/19 08/19/19  Dagar, Geralynn Rile, MD  Vitamin D, Ergocalciferol, (DRISDOL) 50000 UNITS CAPS capsule Take 1 capsule (50,000 Units total) by mouth every 7 (seven) days. Patient not taking: Reported on 04/25/2018 02/21/14   Alfonso Ramus T, FNP    Allergies    Peanut-containing drug products  Review of Systems   Review of Systems  Constitutional: Negative for chills and fever.  HENT: Negative for ear pain and sore throat.   Eyes: Negative for pain and visual disturbance.  Respiratory: Negative for cough and shortness of breath.   Cardiovascular: Negative for chest pain and palpitations.  Gastrointestinal: Negative for abdominal pain and vomiting.  Genitourinary: Negative for dysuria and hematuria.  Musculoskeletal: Negative for arthralgias and back pain.  Skin: Negative for color change and rash.  Neurological: Negative for seizures and syncope.  Psychiatric/Behavioral: Positive for suicidal ideas.  All other systems reviewed and are  negative.   Physical Exam Updated Vital Signs  ED Triage Vitals  Enc Vitals Group     BP 12/13/19 1959 133/84     Pulse Rate 12/13/19 1959 (!) 114     Resp 12/13/19 1959 16     Temp 12/13/19 1959 98.6 F (37 C)     Temp Source 12/13/19 1959 Oral     SpO2 12/13/19 1959 100 %     Weight --      Height --      Head Circumference --      Peak Flow --      Pain Score 12/13/19 2008 0     Pain Loc --      Pain Edu? --      Excl. in GC? --     Physical Exam Vitals and nursing note reviewed.  Constitutional:      General:  She is not in acute distress.    Appearance: She is well-developed. She is not ill-appearing.  HENT:     Head: Normocephalic and atraumatic.     Nose: Nose normal.     Mouth/Throat:     Mouth: Mucous membranes are moist.  Eyes:     Extraocular Movements: Extraocular movements intact.     Conjunctiva/sclera: Conjunctivae normal.     Pupils: Pupils are equal, round, and reactive to light.  Cardiovascular:     Rate and Rhythm: Normal rate and regular rhythm.     Pulses: Normal pulses.     Heart sounds: Normal heart sounds. No murmur heard.   Pulmonary:     Effort: Pulmonary effort is normal. No respiratory distress.     Breath sounds: Normal breath sounds.  Abdominal:     Palpations: Abdomen is soft.     Tenderness: There is no abdominal tenderness.  Musculoskeletal:     Cervical back: Normal range of motion and neck supple.  Skin:    General: Skin is warm and dry.     Capillary Refill: Capillary refill takes less than 2 seconds.  Neurological:     General: No focal deficit present.     Mental Status: She is alert and oriented to person, place, and time.     Cranial Nerves: No cranial nerve deficit.     Sensory: No sensory deficit.     Motor: No weakness.  Psychiatric:        Behavior: Behavior is withdrawn.        Thought Content: Thought content includes suicidal ideation.     ED Results / Procedures / Treatments   Labs (all labs ordered are listed, but only abnormal results are displayed) Labs Reviewed  COMPREHENSIVE METABOLIC PANEL - Abnormal; Notable for the following components:      Result Value   Potassium 3.3 (*)    Calcium 8.7 (*)    All other components within normal limits  SALICYLATE LEVEL - Abnormal; Notable for the following components:   Salicylate Lvl <7.0 (*)    All other components within normal limits  ACETAMINOPHEN LEVEL - Abnormal; Notable for the following components:   Acetaminophen (Tylenol), Serum <10 (*)    All other components within  normal limits  RAPID URINE DRUG SCREEN, HOSP PERFORMED - Abnormal; Notable for the following components:   Tetrahydrocannabinol POSITIVE (*)    All other components within normal limits  ETHANOL  CBC  I-STAT BETA HCG BLOOD, ED ([facility], [facility], [facility] ONLY)    EKG None  Radiology No results found.  Procedures Procedures (including  critical care time)  Medications Ordered in ED Medications - No data to display  ED Course  I have reviewed the triage vital signs and the nursing notes.  Pertinent labs & imaging results that were available during my care of the patient were reviewed by me and considered in my medical decision making (see chart for details).    MDM Rules/Calculators/A&P                          Alera A Outland is a 24 year old female with history of schizophrenia who comes with IVC.  She has had suicidal thoughts endorsing that she does not want to be here anymore.  She is not compliant with her medications.  She is currently living out of her car.  Family filled IVC.  Medically she is cleared.  No physical complaints.  Lab work unremarkable.  Consult for TTS has been placed.  Pending psych recommendations.  This chart was dictated using voice recognition software.  Despite best efforts to proofread,  errors can occur which can change the documentation meaning.     Final Clinical Impression(s) / ED Diagnoses Final diagnoses:  Suicidal thoughts    Rx / DC Orders ED Discharge Orders    None       Virgina Norfolk, DO 12/14/19 0045

## 2019-12-13 NOTE — ED Notes (Signed)
Pt wanded by security. 

## 2019-12-14 LAB — T4, FREE: Free T4: 1.19 ng/dL — ABNORMAL HIGH (ref 0.61–1.12)

## 2019-12-14 LAB — TSH: TSH: 1.623 u[IU]/mL (ref 0.350–4.500)

## 2019-12-14 MED ORDER — CLONIDINE HCL 0.1 MG PO TABS: 0.1000 mg | ORAL_TABLET | Freq: Four times a day (QID) | ORAL | Status: DC

## 2019-12-14 MED ORDER — NAPROXEN 250 MG PO TABS: 500.0000 mg | ORAL_TABLET | Freq: Two times a day (BID) | ORAL | Status: DC | PRN

## 2019-12-14 MED ORDER — HYDROXYZINE HCL 25 MG PO TABS: 25.0000 mg | ORAL_TABLET | Freq: Four times a day (QID) | ORAL | Status: DC | PRN

## 2019-12-14 MED ORDER — CLONIDINE HCL 0.1 MG PO TABS: 0.1000 mg | ORAL_TABLET | Freq: Every day | ORAL | Status: DC

## 2019-12-14 MED ORDER — LORAZEPAM 2 MG/ML IJ SOLN
2.0000 mg | Freq: Once | INTRAMUSCULAR | Status: AC
  Administered 2019-12-14: 2 mg via INTRAMUSCULAR
  Filled 2019-12-14: qty 1

## 2019-12-14 MED ORDER — ZIPRASIDONE MESYLATE 20 MG IM SOLR: 15.0000 mg | Freq: Once | INTRAMUSCULAR | Status: AC

## 2019-12-14 MED ORDER — CLONIDINE HCL 0.1 MG PO TABS: 0.1000 mg | ORAL_TABLET | ORAL | Status: DC

## 2019-12-14 MED ORDER — LOPERAMIDE HCL 2 MG PO CAPS: 2.0000 mg | ORAL_CAPSULE | ORAL | Status: DC | PRN

## 2019-12-14 MED ORDER — DICYCLOMINE HCL 20 MG PO TABS: 20.0000 mg | ORAL_TABLET | Freq: Four times a day (QID) | ORAL | Status: DC | PRN

## 2019-12-14 MED ORDER — ONDANSETRON 4 MG PO TBDP: 4.0000 mg | ORAL_TABLET | Freq: Four times a day (QID) | ORAL | Status: DC | PRN

## 2019-12-14 MED ORDER — STERILE WATER FOR INJECTION IJ SOLN
10.0000 mL | Freq: Once | INTRAMUSCULAR | Status: AC
  Administered 2019-12-14: 10 mL via INTRAMUSCULAR
  Filled 2019-12-14: qty 10

## 2019-12-14 MED ORDER — ZIPRASIDONE MESYLATE 20 MG IM SOLR
INTRAMUSCULAR | Status: AC
  Administered 2019-12-14: 15 mg via INTRAMUSCULAR
  Filled 2019-12-14: qty 20

## 2019-12-14 MED ORDER — HYDROXYZINE HCL 25 MG PO TABS
25.0000 mg | ORAL_TABLET | Freq: Three times a day (TID) | ORAL | Status: DC | PRN
  Administered 2019-12-14 – 2019-12-16 (×3): 25 mg via ORAL
  Filled 2019-12-14 (×3): qty 1

## 2019-12-14 MED ORDER — TRAZODONE HCL 50 MG PO TABS
50.0000 mg | ORAL_TABLET | Freq: Every day | ORAL | Status: DC
  Administered 2019-12-14 – 2019-12-16 (×3): 50 mg via ORAL
  Filled 2019-12-14 (×3): qty 1

## 2019-12-14 MED ORDER — HYDROXYZINE HCL 25 MG PO TABS: 25.0000 mg | ORAL_TABLET | Freq: Three times a day (TID) | ORAL | Status: DC | PRN

## 2019-12-14 MED ORDER — METHOCARBAMOL 500 MG PO TABS: 500.0000 mg | ORAL_TABLET | Freq: Three times a day (TID) | ORAL | Status: DC | PRN

## 2019-12-14 NOTE — ED Notes (Signed)
Meal tray delivered, pt eating 

## 2019-12-14 NOTE — ED Notes (Signed)
Pt ambulated to and from bathroom with a steady gait.

## 2019-12-14 NOTE — ED Notes (Signed)
Pt is yelling obscenities at staff and being uncooperative. Unable to update VS at this time.
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
## 2019-12-14 NOTE — ED Notes (Signed)
Patient very concerned about being discharged and going to work.  Patient becoming loud and belligerent.  Patient yelling obscenities at staff and security.  New order per Dr Clayborne Dana.

## 2019-12-14 NOTE — ED Notes (Signed)
Pt is currently calm, cooperative and is resting. Sitter is present.

## 2019-12-14 NOTE — ED Notes (Signed)
Breakfast Ordered 

## 2019-12-14 NOTE — ED Notes (Signed)
Pt getting extremely agitated. Yelling at staff and coming up to nurses station screaming about wanting to leave.

## 2019-12-14 NOTE — ED Notes (Signed)
Late entry**  Pt at nurses station yelling, "I need to leave, I have to be at work at 2 o'clock today" MD aware, meds ordered and given by Oliver Hum

## 2019-12-15 ENCOUNTER — Other Ambulatory Visit: Payer: Self-pay

## 2019-12-15 LAB — LIPID PANEL
Cholesterol: 131 mg/dL (ref 0–200)
HDL: 45 mg/dL (ref >40)
LDL Cholesterol: 76 mg/dL (ref 0–99)
Total CHOL/HDL Ratio: 2.9 RATIO
Triglycerides: 48 mg/dL (ref <150)
VLDL: 10 mg/dL (ref 0–40)

## 2019-12-15 MED ORDER — ARIPIPRAZOLE 5 MG PO TABS
5.0000 mg | ORAL_TABLET | Freq: Two times a day (BID) | ORAL | Status: DC
  Administered 2019-12-15 (×2): 5 mg via ORAL
  Filled 2019-12-15 (×2): qty 1

## 2019-12-15 NOTE — ED Provider Notes (Signed)
Patient seen by behavioral health service line today and continues to require inpatient treatment   Lorre Nick, MD 12/15/19 1221

## 2019-12-15 NOTE — BHH Counselor (Signed)
Miki Kins, RN,  notified through secure chat that per Maxie Barb, NP, patient continues to meet criteria for inpatient treatment.
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
## 2019-12-15 NOTE — ED Notes (Signed)
Pt with TTS at this time.  

## 2019-12-16 LAB — RESP PANEL BY RT-PCR (FLU A&B, COVID) ARPGX2
Influenza A by PCR: NEGATIVE
Influenza B by PCR: NEGATIVE
SARS Coronavirus 2 by RT PCR: NEGATIVE

## 2019-12-16 MED ORDER — ARIPIPRAZOLE 5 MG PO TABS
15.0000 mg | ORAL_TABLET | Freq: Every day | ORAL | Status: DC
  Administered 2019-12-16: 15 mg via ORAL
  Filled 2019-12-16: qty 1

## 2019-12-16 MED ORDER — ARIPIPRAZOLE 10 MG PO TABS
20.0000 mg | ORAL_TABLET | Freq: Every day | ORAL | Status: DC
  Filled 2019-12-16: qty 2

## 2019-12-16 NOTE — ED Notes (Addendum)
Pt denies SI/HI, denies hallucinations. Pt is agitated but cooperative. Denies pain.

## 2019-12-16 NOTE — ED Notes (Signed)
Pt ambulated to restroom. 

## 2019-12-16 NOTE — ED Notes (Signed)
Pt ambulating to br.

## 2019-12-16 NOTE — Progress Notes (Signed)
Patient meets criteria for inpatient treatment per Maxie Barb, NP. No appropriate beds at [facility] currently. CSW faxed referrals to the following facilities for review:   [facility]     [facility]   [facility]   [facility]   [facility]   [facility]   [facility]   [facility]   [facility]   [facility]   [facility]   [facility]   [facility]   [facility]   [facility]   [facility]   [facility]   [facility]      TTS will continue to seek bed placement.     Stephannie Peters, MSW, LCSW Clincal Social Worker Disposition  [facility] 12/16/2019 11:46 AM

## 2019-12-16 NOTE — ED Provider Notes (Signed)
Emergency Medicine Observation Re-evaluation Note  Heather Kramer is a 24 y.o. female, seen on rounds today.  Pt initially presented to the ED for complaints of IVC- Agressive/Hallucinations Currently, the patient is resting comfortably.  Physical Exam  BP 105/68 (BP Location: Right Arm)   Pulse 88   Temp 98.1 F (36.7 C) (Oral)   Resp 16   Ht 6\' 1"  (1.854 m)   Wt 62.8 kg   SpO2 100%   BMI 18.27 kg/m  Physical Exam General: Calm and cooperative Cardiac: Well perfused  Lungs: Even unlabored respirations Psych: Calm and cooperative   ED Course / MDM  EKG:EKG Interpretation  Date/Time:  Friday December 14 2019 15:28:48 EST Ventricular Rate:  83 PR Interval:  158 QRS Duration: 74 QT Interval:  356 QTC Calculation: 418 R Axis:   81 Text Interpretation: Normal sinus rhythm Septal infarct , age undetermined Abnormal ECG When compared with ECG of 08/01/2019, No significant change was found Confirmed by 08/03/2019 (Dione Booze) on 12/15/2019 1:54:01 AM    I have reviewed the labs performed to date as well as medications administered while in observation.  Recent changes in the last 24 hours include patient accepted to inpatient psychiatry admission.  Plan  Current plan is for transfer to [facility] in [place]. Dr. [place] is accepting. Attending is Mylo Red, NP  Patient is under full IVC at this time.   Maxie Barb, MD 12/16/19 2212

## 2019-12-16 NOTE — ED Notes (Signed)
Breakfast Ordered 

## 2019-12-16 NOTE — ED Notes (Signed)
Shayne Alken (mother) requesting to have patient's car keys. Patient provided permission to sign her keys out of security and have them given to her mother. ID checked.

## 2019-12-16 NOTE — Progress Notes (Signed)
Pt has been accepted to [facility])  Accepting provider is Dr. Mylo Red.   Attending provider is Maxie Barb, NP.    Number for report is [phone].   Awaiting COVID test and CSW faxed IVC paperwork over per request to [phone].    Stephannie Peters, MSW, LCSW Licensed Visual merchandiser - PRN (Transition of Care Team) [facility]

## 2019-12-16 NOTE — ED Notes (Signed)
Pt asking to go home. Stating that she does not have schizophrenia and does not need to be on medication. 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 Repeatedly asking to leave so that she can go to work tomorrow.
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
## 2019-12-16 NOTE — ED Notes (Signed)
Report called to Mitchel Honour at [facility] in [place].
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
## 2019-12-17 NOTE — ED Notes (Signed)
[facility] arranged to take [facility] to [facility] in [place].

## 2019-12-27 ENCOUNTER — Other Ambulatory Visit: Payer: Self-pay

## 2019-12-27 ENCOUNTER — Emergency Department (HOSPITAL_COMMUNITY)
Admission: EM | Admit: 2019-12-27 | Discharge: 2019-12-27 | Disposition: A | Discharge status: Home / Self Care | Payer: Medicaid Other | Attending: Emergency Medicine | Admitting: Emergency Medicine

## 2019-12-27 ENCOUNTER — Encounter (HOSPITAL_COMMUNITY): Payer: Self-pay

## 2019-12-27 DIAGNOSIS — Z0279 Encounter for issue of other medical certificate: Secondary | ICD-10-CM | POA: Diagnosis not present

## 2019-12-27 DIAGNOSIS — Z91010 Allergy to peanuts: Secondary | ICD-10-CM | POA: Diagnosis not present

## 2019-12-27 DIAGNOSIS — Z008 Encounter for other general examination: Secondary | ICD-10-CM

## 2019-12-27 DIAGNOSIS — Z79899 Other long term (current) drug therapy: Secondary | ICD-10-CM | POA: Diagnosis not present

## 2019-12-27 DIAGNOSIS — F209 Schizophrenia, unspecified: Secondary | ICD-10-CM | POA: Diagnosis not present

## 2019-12-27 DIAGNOSIS — F17210 Nicotine dependence, cigarettes, uncomplicated: Secondary | ICD-10-CM | POA: Insufficient documentation

## 2019-12-27 NOTE — ED Triage Notes (Signed)
Patient states that she was just discharged from [facility] yesterday and they would not write her work note. Denies SI thoughts and taking meds with mood improvement. Wants to return to work.

## 2019-12-27 NOTE — ED Provider Notes (Signed)
[facility] EMERGENCY DEPARTMENT Provider Note   CSN: [id number] Arrival date & time: 12/27/19  1038     History Chief Complaint  Patient presents with  . work note    Heather Kramer is a 24 y.o. female.  Patient is a 24 year old female with a history of schizophrenia and thyroiditis who is presenting today requesting a work note to return to work.  Patient reports that she was evaluated for a mental health issue and went to an inpatient facility for 4 days but she has been feeling good since discharge and she would like a note to return to work.  She tried to return to work but they required that she have a note before she come back.  She has no complaints at this time  The history is provided by the patient.       Past Medical History:  Diagnosis Date  . Acquired acanthosis nigricans   . Allergies   . Goiter   . Goiter   . Overweight(278.02)   . Pre-diabetes   . Schizophrenia (HCC)   . Thyroiditis, autoimmune     Patient Active Problem List   Diagnosis Date Noted  . Schizophrenia, paranoid (HCC) 08/02/2019  . Psychosis (HCC) 07/29/2019  . Goiter   . Thyroiditis, autoimmune   . Acquired acanthosis nigricans   . 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 Goiter, unspecified 06/26/2010  . Pre-diabetes 06/26/2010    Past Surgical History:  Procedure Laterality Date  . NO PAST SURGERIES       OB History   No obstetric history on file.     Family History  Problem Relation Age of Onset  . Obesity Mother   . Heart disease Father   . Cancer Maternal Grandmother   . Diabetes Maternal Grandfather     Social History   Tobacco Use  . Smoking status: Current Every Day Smoker    Packs/day: 1.00    Types: Cigarettes  . Smokeless tobacco: Never Used  Substance Use Topics  . Alcohol use: Yes  . Drug use: Yes    Types: Marijuana    Home Medications Prior to Admission medications   Medication Sig Start Date End Date Taking?  Authorizing Provider  acetaminophen (TYLENOL) 325 MG tablet Take 650 mg by mouth every 6 (six) hours as needed for mild pain or headache.    [provider]  diphenhydrAMINE (BENADRYL) 2 % cream Apply 1 application topically daily as needed for itching.     [provider]  hydrOXYzine (ATARAX/VISTARIL) 25 MG tablet Take 25 mg by mouth 2 (two) times daily as needed for anxiety.    [provider]  PROAIR HFA 108 [phone] Base) MCG/ACT inhaler Inhale 2 puffs into the lungs every 4 (four) hours as needed for shortness of breath or wheezing. 11/04/17   [provider]  risperiDONE (RISPERDAL) 1 MG tablet Take 1 tablet (1 mg total) by mouth every morning for 14 days. Patient not taking: Reported on 12/14/2019 08/06/19 08/20/19  Dagar, Geralynn Rile, MD  risperiDONE (RISPERDAL) 3 MG tablet Take 1 tablet (3 mg total) by mouth at bedtime for 14 days. Patient not taking: Reported on 12/14/2019 08/05/19 08/19/19  Dagar, Geralynn Rile, MD  Vitamin D, Ergocalciferol, (DRISDOL) 50000 UNITS CAPS capsule Take 1 capsule (50,000 Units total) by mouth every 7 (seven) days. Patient not taking: Reported on 04/25/2018 02/21/14   Alfonso Ramus T, FNP    Allergies    Peanut-containing drug products  Review of  Systems   Review of Systems  All other systems reviewed and are negative.   Physical Exam Updated Vital Signs BP 138/80   Pulse 96   Temp 98.8 F (37.1 C) (Oral)   Resp 16   Ht 6\' 1"  (1.854 m)   Wt 63 kg   SpO2 99%   BMI 18.32 kg/m   Physical Exam Vitals and nursing note reviewed.  Constitutional:      General: She is not in acute distress.    Appearance: Normal appearance. She is normal weight.  Cardiovascular:     Rate and Rhythm: Normal rate.  Pulmonary:     Effort: Pulmonary effort is normal.  Neurological:     Mental Status: She is alert. Mental status is at baseline.  Psychiatric:     Comments: Calm and cooperative     ED Results / Procedures / Treatments    Labs (all labs ordered are listed, but only abnormal results are displayed) Labs Reviewed - No data to display  EKG None  Radiology No results found.  Procedures Procedures (including critical care time)  Medications Ordered in ED Medications - No data to display  ED Course  I have reviewed the triage vital signs and the nursing notes.  Pertinent labs & imaging results that were available during my care of the patient were reviewed by me and considered in my medical decision making (see chart for details).    MDM Rules/Calculators/A&P                          Patient is here for a note to return to work.  She has no other complaints at this time. Final Clinical Impression(s) / ED Diagnoses Final diagnoses:  Encounter for work capability assessment    Rx / DC Orders ED Discharge Orders    None       , MD 12/27/19 1216

## 2020-01-01 ENCOUNTER — Other Ambulatory Visit: Payer: Self-pay

## 2020-01-01 ENCOUNTER — Encounter (HOSPITAL_COMMUNITY): Payer: Self-pay | Admitting: Emergency Medicine

## 2020-01-01 ENCOUNTER — Emergency Department (HOSPITAL_COMMUNITY)
Admission: EM | Admit: 2020-01-01 | Discharge: 2020-01-01 | Disposition: A | Discharge status: Home / Self Care | Payer: Medicaid Other | Attending: Emergency Medicine | Admitting: Emergency Medicine

## 2020-01-01 DIAGNOSIS — R519 Headache, unspecified: Secondary | ICD-10-CM | POA: Insufficient documentation

## 2020-01-01 DIAGNOSIS — Z5321 Procedure and treatment not carried out due to patient leaving prior to being seen by health care provider: Secondary | ICD-10-CM | POA: Insufficient documentation

## 2020-01-01 NOTE — ED Notes (Signed)
Called x 3 NO answer 

## 2020-01-01 NOTE — ED Triage Notes (Signed)
Pt states she is here for a headache with no other symptoms, however pt poor historian, unable to provide any history during triage, except to state that she took some medication for her head but is unsure what it was.

## 2020-06-13 ENCOUNTER — Other Ambulatory Visit: Payer: Self-pay

## 2020-06-13 ENCOUNTER — Ambulatory Visit (HOSPITAL_COMMUNITY)
Admission: EM | Admit: 2020-06-13 | Discharge: 2020-06-14 | Disposition: A | Discharge status: Other Acute Inpatient Hospital | Payer: Medicaid Other | Attending: Family | Admitting: Family

## 2020-06-13 DIAGNOSIS — Z5900 Homelessness unspecified: Secondary | ICD-10-CM | POA: Insufficient documentation

## 2020-06-13 DIAGNOSIS — F200 Paranoid schizophrenia: Secondary | ICD-10-CM | POA: Diagnosis not present

## 2020-06-13 DIAGNOSIS — F17210 Nicotine dependence, cigarettes, uncomplicated: Secondary | ICD-10-CM | POA: Insufficient documentation

## 2020-06-13 DIAGNOSIS — Z20822 Contact with and (suspected) exposure to covid-19: Secondary | ICD-10-CM | POA: Insufficient documentation

## 2020-06-13 DIAGNOSIS — F1290 Cannabis use, unspecified, uncomplicated: Secondary | ICD-10-CM | POA: Insufficient documentation

## 2020-06-13 LAB — COMPREHENSIVE METABOLIC PANEL
ALT: 14 U/L (ref 0–44)
AST: 19 U/L (ref 15–41)
Albumin: 3.9 g/dL (ref 3.5–5.0)
Alkaline Phosphatase: 56 U/L (ref 38–126)
Anion gap: 5 (ref 5–15)
BUN: 8 mg/dL (ref 6–20)
CO2: 26 mmol/L (ref 22–32)
Calcium: 8.8 mg/dL — ABNORMAL LOW (ref 8.9–10.3)
Chloride: 106 mmol/L (ref 98–111)
Creatinine, Ser: 0.87 mg/dL (ref 0.44–1.00)
GFR, Estimated: >60 mL/min (ref >60)
Glucose, Bld: 85 mg/dL (ref 70–99)
Potassium: 4.1 mmol/L (ref 3.5–5.1)
Sodium: 137 mmol/L (ref 135–145)
Total Bilirubin: 0.4 mg/dL (ref 0.3–1.2)
Total Protein: 6.8 g/dL (ref 6.5–8.1)

## 2020-06-13 LAB — POCT URINE DRUG SCREEN - MANUAL ENTRY (I-SCREEN)
POC Amphetamine UR: NOT DETECTED
POC Buprenorphine (BUP): NOT DETECTED
POC Cocaine UR: NOT DETECTED
POC Marijuana UR: POSITIVE — AB
POC Methadone UR: NOT DETECTED
POC Methamphetamine UR: NOT DETECTED
POC Morphine: NOT DETECTED
POC Oxazepam (BZO): NOT DETECTED
POC Oxycodone UR: NOT DETECTED
POC Secobarbital (BAR): NOT DETECTED

## 2020-06-13 LAB — CBC WITH DIFFERENTIAL/PLATELET
Abs Immature Granulocytes: 0.02 10*3/uL (ref 0.00–0.07)
Basophils Absolute: 0 10*3/uL (ref 0.0–0.1)
Basophils Relative: 1 %
Eosinophils Absolute: 0.1 10*3/uL (ref 0.0–0.5)
Eosinophils Relative: 1 %
HCT: 37.4 % (ref 36.0–46.0)
Hemoglobin: 12.2 g/dL (ref 12.0–15.0)
Immature Granulocytes: 0 %
Lymphocytes Relative: 28 %
Lymphs Abs: 1.8 10*3/uL (ref 0.7–4.0)
MCH: 29.5 pg (ref 26.0–34.0)
MCHC: 32.6 g/dL (ref 30.0–36.0)
MCV: 90.3 fL (ref 80.0–100.0)
Monocytes Absolute: 0.5 10*3/uL (ref 0.1–1.0)
Monocytes Relative: 8 %
Neutro Abs: 4 10*3/uL (ref 1.7–7.7)
Neutrophils Relative %: 62 %
Platelets: 306 10*3/uL (ref 150–400)
RBC: 4.14 MIL/uL (ref 3.87–5.11)
RDW: 12.9 % (ref 11.5–15.5)
WBC: 6.4 10*3/uL (ref 4.0–10.5)
nRBC: 0 % (ref 0.0–0.2)

## 2020-06-13 LAB — RESP PANEL BY RT-PCR (FLU A&B, COVID) ARPGX2
Influenza A by PCR: NEGATIVE
Influenza B by PCR: NEGATIVE
SARS Coronavirus 2 by RT PCR: NEGATIVE

## 2020-06-13 LAB — POCT PREGNANCY, URINE: Preg Test, Ur: NEGATIVE

## 2020-06-13 LAB — LIPID PANEL
Cholesterol: 135 mg/dL (ref 0–200)
HDL: 52 mg/dL (ref >40)
LDL Cholesterol: 77 mg/dL (ref 0–99)
Total CHOL/HDL Ratio: 2.6 RATIO
Triglycerides: 28 mg/dL (ref <150)
VLDL: 6 mg/dL (ref 0–40)

## 2020-06-13 LAB — POC SARS CORONAVIRUS 2 AG: SARSCOV2ONAVIRUS 2 AG: NEGATIVE

## 2020-06-13 LAB — ETHANOL: Alcohol, Ethyl (B): <10 mg/dL (ref <10)

## 2020-06-13 LAB — MAGNESIUM: Magnesium: 2.2 mg/dL (ref 1.7–2.4)

## 2020-06-13 LAB — TSH: TSH: 0.441 u[IU]/mL (ref 0.350–4.500)

## 2020-06-13 MED ORDER — HYDROXYZINE HCL 25 MG PO TABS: 25.0000 mg | ORAL_TABLET | Freq: Three times a day (TID) | ORAL | Status: DC | PRN

## 2020-06-13 MED ORDER — ALUM & MAG HYDROXIDE-SIMETH 200-200-20 MG/5ML PO SUSP: 30.0000 mL | ORAL | Status: DC | PRN

## 2020-06-13 MED ORDER — ZIPRASIDONE MESYLATE 20 MG IM SOLR: 20.0000 mg | Freq: Two times a day (BID) | INTRAMUSCULAR | Status: DC | PRN

## 2020-06-13 MED ORDER — MAGNESIUM HYDROXIDE 400 MG/5ML PO SUSP: 30.0000 mL | Freq: Every day | ORAL | Status: DC | PRN

## 2020-06-13 MED ORDER — LORAZEPAM 1 MG PO TABS: 1.0000 mg | ORAL_TABLET | ORAL | Status: DC | PRN

## 2020-06-13 MED ORDER — ACETAMINOPHEN 325 MG PO TABS: 650.0000 mg | ORAL_TABLET | Freq: Four times a day (QID) | ORAL | Status: DC | PRN

## 2020-06-13 MED ORDER — RISPERIDONE 2 MG PO TBDP
2.0000 mg | ORAL_TABLET | Freq: Every day | ORAL | Status: DC
  Administered 2020-06-13: 2 mg via ORAL
  Filled 2020-06-13: qty 1

## 2020-06-13 NOTE — ED Notes (Signed)
Patient sleepin in bed at this time. Respirations even and unlabored. Will continue to monitor for safety

## 2020-06-13 NOTE — BH Assessment (Addendum)
Per Doran Heater, NP, Inpatient psychiatric treatment recommended, patient remains under involuntary commitment. Patent faxed out to multiple facilities for consideration of bed placement.   Patient faxed to the hospitals listed below for consideration of bed placement.   [facility]      [facility] Details     [facility] Details     [facility] Details     [facility] Details     [facility] Details     [facility] Details     [facility] Details     [facility] Details     [facility] Details     [facility] Details     [facility] Details     [facility] Details     [facility] Details     [facility] Details     [facility] Details     [facility] Details     [facility] Details     [facility] Details     [facility] Details     [facility] Details     [facility] Details
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
## 2020-06-13 NOTE — BHH Counselor (Signed)
This counselor has completed first exam and placed it in folder at nursing station.

## 2020-06-14 NOTE — ED Notes (Signed)
Feminine hygiene products given per pt request.

## 2020-06-14 NOTE — ED Notes (Signed)
Pt asleep in bed. Respirations even and unlabored. Will continue to monitor for safety. ?

## 2020-06-14 NOTE — ED Notes (Signed)
Pt given breakfast.

## 2020-06-14 NOTE — ED Notes (Signed)
Pt on the phone yelling and crying. Staff informed Pt that she needed to get off the phone because she was getting to upset. Pt stated to the person on the phone, "I'm Heather Kramer and Heather Kramer without Heather Kramer". She keeps hanging up the phone and then making another phone call. Safety maintained and will continue to monitor.

## 2020-06-14 NOTE — Progress Notes (Signed)
Per Robbie,Clinician, pt has been accepted to [facility]. Accepting provider is Dr. Thotakura. Patient can arrive anytime today. Number for report is [phone].   Younes Degeorge, MSW, LCSW-A Phone: [phone] Disposition/TOC 

## 2020-06-14 NOTE — ED Notes (Signed)
Pt given snack. 

## 2020-06-14 NOTE — ED Notes (Signed)
Pt given lunch

## 2020-06-14 NOTE — ED Notes (Signed)
Discharge instructions provided and Pt stated understanding. Personal belongings returned. Pt alert, orient and ambulatory. Pt escorted to the sally port for transportation with [facility]. Safety maintained.
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
## 2020-06-14 NOTE — Progress Notes (Signed)
Per Ardell Isaacs, patient meets criteria for inpatient treatment. There are no available or appropriate beds at [facility] today. CSW faxed referrals to the following facilities for review:  [facility]   TTS will continue to seek bed placement.  Crissie Reese, MSW, LCSW-A, LCAS-A Phone: [phone] Disposition/TOC

## 2020-06-14 NOTE — ED Notes (Signed)
Informed Pt again that she needed to get off the phone because she continue dto get upset. Pt hung the phone and began cursing the staff. Staff informed Pt that she needed to stop cursing because of Pt's were trying to rest and there are kids that can hear her, she was loud at this time. Pt stated, "I don't care. Those are my fucking kids". Pt laid down on the pull out bed and covered herself up. Will continue to monitor.

## 2020-06-15 LAB — PROLACTIN: Prolactin: 26.5 ng/mL — ABNORMAL HIGH (ref 4.8–23.3)

## 2020-06-16 LAB — HEMOGLOBIN A1C
Hgb A1c MFr Bld: 5.2 % (ref 4.8–5.6)
Mean Plasma Glucose: 103 mg/dL

## 2020-10-19 ENCOUNTER — Other Ambulatory Visit: Payer: Self-pay

## 2020-10-19 ENCOUNTER — Encounter (HOSPITAL_COMMUNITY): Payer: Self-pay | Admitting: *Deleted

## 2020-10-19 ENCOUNTER — Ambulatory Visit (HOSPITAL_COMMUNITY)
Admission: EM | Admit: 2020-10-19 | Discharge: 2020-10-19 | Disposition: A | Discharge status: Home / Self Care | Payer: Medicaid Other

## 2020-10-19 DIAGNOSIS — J4521 Mild intermittent asthma with (acute) exacerbation: Secondary | ICD-10-CM

## 2020-10-19 DIAGNOSIS — Z760 Encounter for issue of repeat prescription: Secondary | ICD-10-CM | POA: Diagnosis not present

## 2020-10-19 HISTORY — DX: Unspecified asthma, uncomplicated: J45.909

## 2020-10-19 MED ORDER — ALBUTEROL SULFATE HFA 108 (90 BASE) MCG/ACT IN AERS: 1.0000 | INHALATION_SPRAY | Freq: Four times a day (QID) | RESPIRATORY_TRACT | 0 refills | Status: DC | PRN

## 2020-10-19 NOTE — ED Triage Notes (Signed)
Pt reports needing a Rx refill for albuterol inhaler.  States has been wheezing x approx 1.5 wks.  Denies fevers.

## 2020-10-19 NOTE — Discharge Instructions (Addendum)
-  Albuterol inhaler as needed for cough, wheezing, shortness of breath, 1 to 2 puffs every 6 hours as needed. ? ?

## 2020-10-19 NOTE — ED Provider Notes (Signed)
.  [facility]    CSN: [id number] Arrival date & time: 10/19/20  1108      History   Chief Complaint Chief Complaint  Patient presents with   Medication Refill    HPI Heather Kramer is a 25 y.o. female presenting with asthma exacerbation due to running out of her inhaler.  Medical history asthma, allergies, schizophrenia, prediabetes.  Patient describes wheezing and shortness of breath for about 1.5 weeks ran out of her inhaler months ago, but no issues until now.  Documented history of allergies, but she denies this.  Does not take medications for allergies.  Denies other symptoms including fever/chills, cough, congestion, recent URI.  HPI  Past Medical History:  Diagnosis Date   Acquired acanthosis nigricans    Allergies    Asthma    Goiter    Goiter    Overweight(278.02)    Pre-diabetes    Schizophrenia (HCC)    Thyroiditis, autoimmune     Patient Active Problem List   Diagnosis Date Noted   Schizophrenia, paranoid (HCC) 08/02/2019   Psychosis (HCC) 07/29/2019   Goiter    Thyroiditis, autoimmune    Acquired acanthosis nigricans    Acromegaly and gigantism (HCC) 06/26/2010   Goiter, unspecified 06/26/2010   Pre-diabetes 06/26/2010    Past Surgical History:  Procedure Laterality Date   MOUTH SURGERY      OB History   No obstetric history on file.      Home Medications    Prior to Admission medications   Medication Sig Start Date End Date Taking? Authorizing Provider  albuterol (VENTOLIN HFA) 108 (90 Base) MCG/ACT inhaler Inhale 1-2 puffs into the lungs every 6 (six) hours as needed for wheezing or shortness of breath. 10/19/20  Yes Rhys Martini, PA-C  RISPERIDONE ER [place] Inject into the skin every 30 (thirty) days.   Yes [provider]  hydrOXYzine (ATARAX/VISTARIL) 25 MG tablet Take 25 mg by mouth 2 (two) times daily as needed for anxiety.    [provider]  risperiDONE (RISPERDAL) 1 MG tablet Take 1 tablet (1 mg  total) by mouth every morning for 14 days. 08/06/19 08/20/19  Dagar, Geralynn Rile, MD    Family History Family History  Problem Relation Age of Onset   Obesity Mother    Heart disease Father    Cancer Maternal Grandmother    Diabetes Maternal Grandfather     Social History Social History   Tobacco Use   Smoking status: Every Day    Packs/day: 1.00    Types: Cigarettes   Smokeless tobacco: Never  Vaping Use   Vaping Use: Never used  Substance Use Topics   Alcohol use: Yes    Comment: occasionally   Drug use: Not Currently    Types: Marijuana     Allergies   Peanut-containing drug products   Review of Systems Review of Systems  Constitutional:  Negative for appetite change, chills and fever.  HENT:  Negative for congestion, ear pain, rhinorrhea, sinus pressure, sinus pain and sore throat.   Eyes:  Negative for redness and visual disturbance.  Respiratory:  Positive for shortness of breath and wheezing. Negative for cough and chest tightness.   Cardiovascular:  Negative for chest pain and palpitations.  Gastrointestinal:  Negative for abdominal pain, constipation, diarrhea, nausea and vomiting.  Genitourinary:  Negative for dysuria, frequency and urgency.  Musculoskeletal:  Negative for myalgias.  Neurological:  Negative for dizziness, weakness and headaches.  Psychiatric/Behavioral:  Negative for  confusion.   All other systems reviewed and are negative.   Physical Exam Triage Vital Signs ED Triage Vitals  Enc Vitals Group     BP 10/19/20 1255 110/75     Pulse Rate 10/19/20 1255 (!) 112     Resp 10/19/20 1255 (!) 22     Temp 10/19/20 1255 98.5 F (36.9 C)     Temp Source 10/19/20 1255 Oral     SpO2 10/19/20 1255 100 %     Weight --      Height --      Head Circumference --      Peak Flow --      Pain Score 10/19/20 1256 0     Pain Loc --      Pain Edu? --      Excl. in [facility]? --    No data found.  Updated Vital Signs BP 110/75   Pulse (!) 112   Temp 98.5 F  (36.9 C) (Oral)   Resp (!) 22   LMP 10/05/2020 (Approximate)   SpO2 100%   Visual Acuity Right Eye Distance:   Left Eye Distance:   Bilateral Distance:    Right Eye Near:   Left Eye Near:    Bilateral Near:     Physical Exam Vitals reviewed.  Constitutional:      General: She is not in acute distress.    Appearance: Normal appearance. She is not ill-appearing.  HENT:     Head: Normocephalic and atraumatic.     Right Ear: Tympanic membrane, ear canal and external ear normal. No tenderness. No middle ear effusion. There is no impacted cerumen. Tympanic membrane is not perforated, erythematous, retracted or bulging.     Left Ear: Tympanic membrane, ear canal and external ear normal. No tenderness.  No middle ear effusion. There is no impacted cerumen. Tympanic membrane is not perforated, erythematous, retracted or bulging.     Nose: Nose normal. No congestion.     Mouth/Throat:     Mouth: Mucous membranes are moist.     Pharynx: Uvula midline. No oropharyngeal exudate or posterior oropharyngeal erythema.  Eyes:     Extraocular Movements: Extraocular movements intact.     Pupils: Pupils are equal, round, and reactive to light.  Cardiovascular:     Rate and Rhythm: Regular rhythm. Tachycardia present.     Heart sounds: Normal heart sounds.  Pulmonary:     Effort: Pulmonary effort is normal. Tachypnea present.     Breath sounds: Decreased breath sounds present. No wheezing, rhonchi or rales.     Comments: BS decreased throughout but no wheezing, oxygenating comfortably Abdominal:     Palpations: Abdomen is soft.     Tenderness: There is no abdominal tenderness. There is no guarding or rebound.  Neurological:     General: No focal deficit present.     Mental Status: She is alert and oriented to person, place, and time.  Psychiatric:        Mood and Affect: Mood normal.        Behavior: Behavior normal.        Thought Content: Thought content normal.        Judgment: Judgment  normal.     UC Treatments / Results  Labs (all labs ordered are listed, but only abnormal results are displayed) Labs Reviewed - No data to display  EKG   Radiology No results found.  Procedures Procedures (including critical care time)  Medications Ordered in UC Medications - No  data to display  Initial Impression / Assessment and Plan / UC Course  I have reviewed the triage vital signs and the nursing notes.  Pertinent labs & imaging results that were available during my care of the patient were reviewed by me and considered in my medical decision making (see chart for details).     This patient is a very pleasant 25 y.o. year old female presenting with asthma exacerbation due to running out of albuterol inhaler.  Suspect that allergies contribute, though she denies this.  She is also a current smoker.  She is borderline tachycardic and tachypneic but oxygenating well on room air.  Albuterol inhaler sent. ED return precautions discussed. Patient verbalizes understanding and agreement. Coding Level 4 for acute illness with systemic symptoms, and prescription drug management    Final Clinical Impressions(s) / UC Diagnoses   Final diagnoses:  Mild intermittent asthma with acute exacerbation  Medication refill     Discharge Instructions      -Albuterol inhaler as needed for cough, wheezing, shortness of breath, 1 to 2 puffs every 6 hours as needed.    ED Prescriptions     Medication Sig Dispense Auth. Provider   albuterol (VENTOLIN HFA) 108 (90 Base) MCG/ACT inhaler Inhale 1-2 puffs into the lungs every 6 (six) hours as needed for wheezing or shortness of breath. 1 each Rhys Martini, PA-C      PDMP not reviewed this encounter.   Rhys Martini, PA-C 10/19/20 1353

## 2023-04-03 ENCOUNTER — Emergency Department (EMERGENCY_DEPARTMENT_HOSPITAL)
Admission: EM | Admit: 2023-04-03 | Discharge: 2023-04-03 | Disposition: A | Discharge status: Other Acute Inpatient Hospital | Source: Home / Self Care | Attending: Emergency Medicine | Admitting: Emergency Medicine

## 2023-04-03 ENCOUNTER — Other Ambulatory Visit: Payer: Self-pay

## 2023-04-03 ENCOUNTER — Inpatient Hospital Stay (HOSPITAL_COMMUNITY)
Admission: AD | Admit: 2023-04-03 | Discharge: 2023-04-12 | DRG: 885 | Disposition: A | Discharge status: Home / Self Care | Source: Intra-hospital | Attending: Psychiatry | Admitting: Psychiatry

## 2023-04-03 ENCOUNTER — Encounter (HOSPITAL_COMMUNITY): Payer: Self-pay | Admitting: Emergency Medicine

## 2023-04-03 ENCOUNTER — Encounter (HOSPITAL_COMMUNITY): Payer: Self-pay | Admitting: Psychiatry

## 2023-04-03 DIAGNOSIS — F209 Schizophrenia, unspecified: Secondary | ICD-10-CM | POA: Diagnosis present

## 2023-04-03 DIAGNOSIS — Z91410 Personal history of adult physical and sexual abuse: Secondary | ICD-10-CM

## 2023-04-03 DIAGNOSIS — R45851 Suicidal ideations: Secondary | ICD-10-CM | POA: Diagnosis present

## 2023-04-03 DIAGNOSIS — F200 Paranoid schizophrenia: Secondary | ICD-10-CM | POA: Diagnosis present

## 2023-04-03 DIAGNOSIS — Z833 Family history of diabetes mellitus: Secondary | ICD-10-CM

## 2023-04-03 DIAGNOSIS — Z91148 Patient's other noncompliance with medication regimen for other reason: Secondary | ICD-10-CM | POA: Diagnosis not present

## 2023-04-03 DIAGNOSIS — Z6831 Body mass index (BMI) 31.0-31.9, adult: Secondary | ICD-10-CM | POA: Diagnosis not present

## 2023-04-03 DIAGNOSIS — Z716 Tobacco abuse counseling: Secondary | ICD-10-CM | POA: Diagnosis not present

## 2023-04-03 DIAGNOSIS — J45909 Unspecified asthma, uncomplicated: Secondary | ICD-10-CM | POA: Diagnosis present

## 2023-04-03 DIAGNOSIS — E063 Autoimmune thyroiditis: Secondary | ICD-10-CM | POA: Diagnosis present

## 2023-04-03 DIAGNOSIS — Z91411 Personal history of adult psychological abuse: Secondary | ICD-10-CM | POA: Diagnosis not present

## 2023-04-03 DIAGNOSIS — E669 Obesity, unspecified: Secondary | ICD-10-CM | POA: Diagnosis present

## 2023-04-03 DIAGNOSIS — Z818 Family history of other mental and behavioral disorders: Secondary | ICD-10-CM

## 2023-04-03 DIAGNOSIS — Z8249 Family history of ischemic heart disease and other diseases of the circulatory system: Secondary | ICD-10-CM

## 2023-04-03 DIAGNOSIS — F419 Anxiety disorder, unspecified: Secondary | ICD-10-CM | POA: Insufficient documentation

## 2023-04-03 DIAGNOSIS — Z79899 Other long term (current) drug therapy: Secondary | ICD-10-CM | POA: Diagnosis not present

## 2023-04-03 DIAGNOSIS — G4700 Insomnia, unspecified: Secondary | ICD-10-CM | POA: Diagnosis present

## 2023-04-03 DIAGNOSIS — R45850 Homicidal ideations: Secondary | ICD-10-CM | POA: Diagnosis present

## 2023-04-03 DIAGNOSIS — F1220 Cannabis dependence, uncomplicated: Secondary | ICD-10-CM | POA: Diagnosis present

## 2023-04-03 DIAGNOSIS — F17200 Nicotine dependence, unspecified, uncomplicated: Secondary | ICD-10-CM | POA: Diagnosis present

## 2023-04-03 DIAGNOSIS — Z823 Family history of stroke: Secondary | ICD-10-CM | POA: Diagnosis not present

## 2023-04-03 DIAGNOSIS — Z560 Unemployment, unspecified: Secondary | ICD-10-CM

## 2023-04-03 DIAGNOSIS — F17210 Nicotine dependence, cigarettes, uncomplicated: Secondary | ICD-10-CM | POA: Diagnosis present

## 2023-04-03 DIAGNOSIS — F29 Unspecified psychosis not due to a substance or known physiological condition: Secondary | ICD-10-CM | POA: Diagnosis present

## 2023-04-03 DIAGNOSIS — F32A Depression, unspecified: Secondary | ICD-10-CM | POA: Insufficient documentation

## 2023-04-03 DIAGNOSIS — R440 Auditory hallucinations: Secondary | ICD-10-CM

## 2023-04-03 LAB — RAPID URINE DRUG SCREEN, HOSP PERFORMED
Amphetamines: NOT DETECTED
Barbiturates: NOT DETECTED
Benzodiazepines: NOT DETECTED
Cocaine: NOT DETECTED
Opiates: NOT DETECTED
Tetrahydrocannabinol: POSITIVE — AB

## 2023-04-03 LAB — CBC
HCT: 44.8 % (ref 36.0–46.0)
Hemoglobin: 15.1 g/dL — ABNORMAL HIGH (ref 12.0–15.0)
MCH: 29.2 pg (ref 26.0–34.0)
MCHC: 33.7 g/dL (ref 30.0–36.0)
MCV: 86.7 fL (ref 80.0–100.0)
Platelets: 396 10*3/uL (ref 150–400)
RBC: 5.17 MIL/uL — ABNORMAL HIGH (ref 3.87–5.11)
RDW: 12.8 % (ref 11.5–15.5)
WBC: 6 10*3/uL (ref 4.0–10.5)
nRBC: 0 % (ref 0.0–0.2)

## 2023-04-03 LAB — SALICYLATE LEVEL: Salicylate Lvl: <7 mg/dL — ABNORMAL LOW (ref 7.0–30.0)

## 2023-04-03 LAB — ACETAMINOPHEN LEVEL: Acetaminophen (Tylenol), Serum: <10 ug/mL — ABNORMAL LOW (ref 10–30)

## 2023-04-03 LAB — COMPREHENSIVE METABOLIC PANEL
ALT: 12 U/L (ref 0–44)
AST: 16 U/L (ref 15–41)
Albumin: 3.8 g/dL (ref 3.5–5.0)
Alkaline Phosphatase: 65 U/L (ref 38–126)
Anion gap: 7 (ref 5–15)
BUN: 5 mg/dL — ABNORMAL LOW (ref 6–20)
CO2: 22 mmol/L (ref 22–32)
Calcium: 9.3 mg/dL (ref 8.9–10.3)
Chloride: 107 mmol/L (ref 98–111)
Creatinine, Ser: 0.93 mg/dL (ref 0.44–1.00)
GFR, Estimated: >60 mL/min (ref >60)
Glucose, Bld: 86 mg/dL (ref 70–99)
Potassium: 4 mmol/L (ref 3.5–5.1)
Sodium: 136 mmol/L (ref 135–145)
Total Bilirubin: 0.6 mg/dL (ref 0.0–1.2)
Total Protein: 7.5 g/dL (ref 6.5–8.1)

## 2023-04-03 LAB — HCG, SERUM, QUALITATIVE: Preg, Serum: NEGATIVE

## 2023-04-03 LAB — ETHANOL: Alcohol, Ethyl (B): <10 mg/dL (ref <10)

## 2023-04-03 MED ORDER — RISPERIDONE 2 MG PO TBDP
4.0000 mg | ORAL_TABLET | Freq: Every day | ORAL | Status: DC
  Administered 2023-04-04 – 2023-04-07 (×4): 4 mg via ORAL
  Filled 2023-04-03 (×7): qty 2

## 2023-04-03 MED ORDER — LORAZEPAM 2 MG/ML IJ SOLN: 2.0000 mg | Freq: Three times a day (TID) | INTRAMUSCULAR | Status: DC | PRN

## 2023-04-03 MED ORDER — ACETAMINOPHEN 325 MG PO TABS
650.0000 mg | ORAL_TABLET | Freq: Four times a day (QID) | ORAL | Status: DC | PRN
  Administered 2023-04-04 – 2023-04-11 (×2): 650 mg via ORAL
  Filled 2023-04-03 (×2): qty 2

## 2023-04-03 MED ORDER — PNEUMOCOCCAL 20-VAL CONJ VACC 0.5 ML IM SUSY
0.5000 mL | PREFILLED_SYRINGE | INTRAMUSCULAR | Status: DC
Start: 2023-04-04 — End: 2023-04-12
  Filled 2023-04-03: qty 0.5

## 2023-04-03 MED ORDER — HALOPERIDOL LACTATE 5 MG/ML IJ SOLN: 5.0000 mg | Freq: Three times a day (TID) | INTRAMUSCULAR | Status: DC | PRN

## 2023-04-03 MED ORDER — AMANTADINE HCL 100 MG PO CAPS
100.0000 mg | ORAL_CAPSULE | Freq: Two times a day (BID) | ORAL | Status: DC
  Administered 2023-04-04 – 2023-04-05 (×3): 100 mg via ORAL
  Filled 2023-04-03 (×5): qty 1

## 2023-04-03 MED ORDER — MAGNESIUM HYDROXIDE 400 MG/5ML PO SUSP: 30.0000 mL | Freq: Every day | ORAL | Status: DC | PRN

## 2023-04-03 MED ORDER — DIPHENHYDRAMINE HCL 50 MG/ML IJ SOLN: 50.0000 mg | Freq: Three times a day (TID) | INTRAMUSCULAR | Status: DC | PRN

## 2023-04-03 MED ORDER — ALUM & MAG HYDROXIDE-SIMETH 200-200-20 MG/5ML PO SUSP: 30.0000 mL | ORAL | Status: DC | PRN

## 2023-04-03 MED ORDER — INFLUENZA VIRUS VACC SPLIT PF (FLUZONE) 0.5 ML IM SUSY
0.5000 mL | PREFILLED_SYRINGE | INTRAMUSCULAR | Status: DC
  Filled 2023-04-03: qty 0.5

## 2023-04-03 MED ORDER — DIPHENHYDRAMINE HCL 25 MG PO CAPS
50.0000 mg | ORAL_CAPSULE | Freq: Three times a day (TID) | ORAL | Status: DC | PRN
  Administered 2023-04-07: 50 mg via ORAL
  Filled 2023-04-03: qty 2

## 2023-04-03 MED ORDER — ALBUTEROL SULFATE HFA 108 (90 BASE) MCG/ACT IN AERS: 1.0000 | INHALATION_SPRAY | Freq: Four times a day (QID) | RESPIRATORY_TRACT | Status: DC | PRN

## 2023-04-03 MED ORDER — NICOTINE 21 MG/24HR TD PT24
21.0000 mg | MEDICATED_PATCH | Freq: Every day | TRANSDERMAL | Status: DC
  Administered 2023-04-03: 21 mg via TRANSDERMAL
  Filled 2023-04-03: qty 1

## 2023-04-03 MED ORDER — HALOPERIDOL 5 MG PO TABS
5.0000 mg | ORAL_TABLET | Freq: Three times a day (TID) | ORAL | Status: DC | PRN
  Administered 2023-04-07: 5 mg via ORAL
  Filled 2023-04-03: qty 1

## 2023-04-03 MED ORDER — HALOPERIDOL LACTATE 5 MG/ML IJ SOLN: 10.0000 mg | Freq: Three times a day (TID) | INTRAMUSCULAR | Status: DC | PRN

## 2023-04-03 MED ORDER — NICOTINE 21 MG/24HR TD PT24
21.0000 mg | MEDICATED_PATCH | Freq: Every day | TRANSDERMAL | Status: DC
  Administered 2023-04-04 – 2023-04-12 (×9): 21 mg via TRANSDERMAL
  Filled 2023-04-03 (×12): qty 1

## 2023-04-03 MED ORDER — AMANTADINE HCL 100 MG PO CAPS
100.0000 mg | ORAL_CAPSULE | Freq: Two times a day (BID) | ORAL | Status: DC
Start: 2023-04-03 — End: 2023-04-03
  Administered 2023-04-03 (×2): 100 mg via ORAL
  Filled 2023-04-03 (×2): qty 1

## 2023-04-03 MED ORDER — RISPERIDONE 1 MG PO TBDP
4.0000 mg | ORAL_TABLET | Freq: Every day | ORAL | Status: DC
  Administered 2023-04-03: 4 mg via ORAL
  Filled 2023-04-03: qty 4

## 2023-04-03 NOTE — ED Provider Notes (Addendum)
  EMERGENCY DEPARTMENT AT [facility] Provider Note  CSN: [id number] Arrival date & time: 04/03/23 4098  Chief Complaint(s) Suicidal and Auditory Hallucinations  HPI Heather Kramer is a 28 y.o. female history of schizophrenia presenting to the emergency department with hallucinations.  Patient reports hearing auditory hallucinations, telling her that she should harm herself.  Maybe had SI earlier, but patient denies this currently.  Denies any homicidal ideation.  No medical complaints such as fevers, chills, abdominal pain, nausea, vomiting, shortness of breath, chest pain, diarrhea, sore throat, runny nose.  Hallucinations have been worsening for about the past week.  Patient was noncompliant with medication but restarted in the past week.  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 Authorizing Provider  albuterol (VENTOLIN HFA) 108 (90 Base) MCG/ACT inhaler Inhale 1-2 puffs into the lungs every 6 (six) hours as needed for wheezing or shortness of breath. 10/19/20   Rhys Martini, PA-C  hydrOXYzine (ATARAX/VISTARIL) 25 MG tablet Take 25 mg by mouth 2 (two) times daily as needed for anxiety.    [provider]  risperiDONE (RISPERDAL) 1 MG tablet Take 1 tablet (1 mg total) by mouth every morning for 14 days. 08/06/19 08/20/19  Dagar, Geralynn Rile, MD  RISPERIDONE ER [place]  Inject into the skin every 30 (thirty) days.    [provider]                                                                                                                                    Past Surgical History Past Surgical History:  Procedure Laterality Date   MOUTH SURGERY     Family History Family History  Problem Relation Age of Onset   Obesity Mother    Heart disease Father    Cancer Maternal Grandmother    Diabetes Maternal Grandfather     Social History Social History   Tobacco Use   Smoking status: Every Day    Current packs/day: 2.00    Types: Cigarettes   Smokeless tobacco: Never  Vaping Use   Vaping status: Never Used  Substance Use Topics   Alcohol use: Yes    Comment: occasionally   Drug use: Not Currently    Types: Marijuana  Allergies Peanut-containing drug products  Review of Systems Review of Systems  All other systems reviewed and are negative.   Physical Exam Vital Signs  I have reviewed the triage vital signs BP 114/74   Pulse 93   Temp 97.6 F (36.4 C)   Resp 16   Ht 6\' 1"  (1.854 m)   Wt 107.5 kg   LMP 02/28/2023   SpO2 100%   BMI 31.27 kg/m  Physical Exam Vitals and nursing note reviewed.  Constitutional:      General: She is not in acute distress.    Appearance: She is well-developed.  HENT:     Head: Normocephalic and atraumatic.     Mouth/Throat:     Mouth: Mucous membranes are moist.  Eyes:     Pupils: Pupils are equal, round, and reactive to light.  Cardiovascular:     Rate and Rhythm: Normal rate and regular rhythm.     Heart sounds: No murmur heard. Pulmonary:     Effort: Pulmonary effort is normal. No respiratory distress.     Breath sounds: Normal breath sounds.  Abdominal:     General: Abdomen is flat.     Palpations: Abdomen is soft.     Tenderness: There is no abdominal tenderness.  Musculoskeletal:        General: No tenderness.     Right lower leg: No edema.     Left lower leg: No edema.   Skin:    General: Skin is warm and dry.  Neurological:     General: No focal deficit present.     Mental Status: She is alert. Mental status is at baseline.  Psychiatric:        Mood and Affect: Mood normal.        Behavior: Behavior normal.     Comments: Reacting to internal stimuli     ED Results and Treatments Labs (all labs ordered are listed, but only abnormal results are displayed) Labs Reviewed  COMPREHENSIVE METABOLIC PANEL - Abnormal; Notable for the following components:      Result Value   BUN 5 (*)    All other components within normal limits  SALICYLATE LEVEL - Abnormal; Notable for the following components:   Salicylate Lvl <7.0 (*)    All other components within normal limits  ACETAMINOPHEN LEVEL - Abnormal; Notable for the following components:   Acetaminophen (Tylenol), Serum <10 (*)    All other components within normal limits  CBC - Abnormal; Notable for the following components:   RBC 5.17 (*)    Hemoglobin 15.1 (*)    All other components within normal limits  ETHANOL  HCG, SERUM, QUALITATIVE  RAPID URINE DRUG SCREEN, HOSP PERFORMED                                                                                                                          Radiology No results found.  Pertinent labs & imaging results that were available during my care  of the patient were reviewed by me and considered in my medical decision making (see MDM for details).  Medications Ordered in ED Medications - No data to display                                                                                                                                   Procedures Procedures  (including critical care time)  Medical Decision Making / ED Course   MDM:  28 year old presenting to the emergency department with hallucinations.  Patient overall well-appearing, does seem to be reacting to some internal stimuli but mainly attentive to conversation.  Currently denying  SI or HI but having command auditory hallucinations.  Given worsening psychiatric symptoms, will consult psychiatry for evaluation of whether the patient would benefit from inpatient psychiatric care.  Patient denies any medical complaints, her physical examination is unremarkable, her and her laboratory testing is reassuring, patient medically clear.  Denies ingestion and Tylenol and salicylate level is negative.  Do not think patient currently needs IVC as she denies SI, HI, and is mainly attentive to conversation and not grossly disorganized, however if she attempts to elope may need reassessment  Patient will be boarding for psychiatric evaluation      Additional history obtained: -Additional history obtained from family -External records from outside source obtained and reviewed including: Chart review including previous notes, labs, imaging, consultation notes including prior notes    Lab Tests: -I ordered, reviewed, and interpreted labs.   The pertinent results include:   Labs Reviewed  COMPREHENSIVE METABOLIC PANEL - Abnormal; Notable for the following components:      Result Value   BUN 5 (*)    All other components within normal limits  SALICYLATE LEVEL - Abnormal; Notable for the following components:   Salicylate Lvl <7.0 (*)    All other components within normal limits  ACETAMINOPHEN LEVEL - Abnormal; Notable for the following components:   Acetaminophen (Tylenol), Serum <10 (*)    All other components within normal limits  CBC - Abnormal; Notable for the following components:   RBC 5.17 (*)    Hemoglobin 15.1 (*)    All other components within normal limits  ETHANOL  HCG, SERUM, QUALITATIVE  RAPID URINE DRUG SCREEN, HOSP PERFORMED    Notable for mild elevated hgb and low bun  Medicines ordered and prescription drug management: No orders of the defined types were placed in this encounter.   -I have reviewed the patients home medicines and have made adjustments as  needed  Social Determinants of Health:  Diagnosis or treatment significantly limited by social determinants of health: current smoker and obesity    Co morbidities that complicate the patient evaluation  Past Medical History:  Diagnosis Date   Acquired acanthosis nigricans    Allergies    Asthma    Goiter    Goiter    Overweight(278.02)    Pre-diabetes  Schizophrenia (HCC)    Thyroiditis, autoimmune       Dispostion: Disposition decision including need for hospitalization was considered, and patient boarding for psychiatric evaluation    Final Clinical Impression(s) / ED Diagnoses Final diagnoses:  Auditory hallucination     This chart was dictated using voice recognition software.  Despite best efforts to proofread,  errors can occur which can change the documentation meaning.    Lonell Grandchild, MD 04/03/23 7829    Lonell Grandchild, MD 04/03/23 [phone]

## 2023-04-03 NOTE — Progress Notes (Signed)
 [facility] LCSW Progress Note   04/03/2023    1:05 PM  Heather Kramer   [id number]   Type of Contact and Topic:  Psychiatric Bed Placement   Pt accepted to [facility] 306-1     Patient meets inpatient criteria per Phebe Colla, NP    The attending provider will be Dr. Abbott Pao   Call report to [phone]    Lovie Macadamia, RN @ [facility] ED notified.     Pt scheduled  to arrive at [facility] TODAY.    Damita Dunnings, MSW, LCSW-A  1:06 PM 04/03/2023

## 2023-04-03 NOTE — ED Notes (Signed)
 Patient is having auditory hallucinations

## 2023-04-03 NOTE — Consult Note (Addendum)
 [facility] Psychiatric Consult Initial  Patient Name: .Heather Kramer  MRN: [id number]  DOB: September 19, 1995  Consult Order details:  Orders (From admission, onward)     Start     Ordered   04/03/23 0935  CONSULT TO CALL ACT TEAM       Ordering Provider: Lonell Grandchild, MD  Provider:  (Not yet assigned)  Question:  Reason for Consult?  Answer:  Psych consult   04/03/23 0935             Mode of Visit: In person    Psychiatry Consult Evaluation  Service Date: April 03, 2023 LOS:  LOS: 0 days  Chief Complaint "Getting told I'm gonna be killed"  Primary Psychiatric Diagnoses  Schizophrenia, paranoid 2.  Suicidal ideation  Assessment  Heather Kramer is a 28 y.o. female admitted: Presented to the EDfor 04/03/2023  7:34 AM for hallucinations and passive suicidal ideation. She carries the psychiatric diagnoses of schizophrenia, anxiety and depression and has a past medical history of acromegaly and gigantism, thyroiditis, asthma and goiter.   Her current presentation of acute psychosis and passive suicidal ideation is most consistent with decompensated schizophrenia. She meets criteria for inpatient hospitalization based on acute psychosis and being a danger to herself.  Current outpatient psychotropic medications include risperidone and amantadine and historically she has had a positive response to these medications. She was compliant with medications prior to admission as evidenced by patient report. On initial examination, patient is cooperative and requesting help adjusting her medications. Please see plan below for detailed recommendations.   Diagnoses:  Active Hospital problems: Principal Problem:   Schizophrenia, paranoid (HCC)    Plan   ## Psychiatric Medication Recommendations:  --Risperidone 4mg  PO Q HS --Amantadine 100mg  PO BID  ## Medical Decision Making Capacity: Not specifically addressed in this encounter  ## Further Work-up:  -- most recent EKG on  12/17/2019 had QtC of 418; updated EKG pending -- Pertinent labwork reviewed earlier this admission includes: CBC, CMP, acetaminophen, salicylate, pregnancy, alcohol and UDS   ## Disposition:-- We recommend inpatient psychiatric hospitalization when medically cleared. Patient is under voluntary admission status at this time; please IVC if attempts to leave hospital.  ## Behavioral / Environmental: -Utilize compassion and acknowledge the patient's experiences while setting clear and realistic expectations for care.    ## Safety and Observation Level:  - Based on my clinical evaluation, I estimate the patient to be at low risk of self harm in the current setting. - At this time, we recommend  1:1 Observation. This decision is based on my review of the chart including patient's history and current presentation, interview of the patient, mental status examination, and consideration of suicide risk including evaluating suicidal ideation, plan, intent, suicidal or self-harm behaviors, risk factors, and protective factors. This judgment is based on our ability to directly address suicide risk, implement suicide prevention strategies, and develop a safety plan while the patient is in the clinical setting. Please contact our team if there is a concern that risk level has changed.  CSSR Risk Category:C-SSRS RISK CATEGORY: Low Risk  Suicide Risk Assessment: Patient has following modifiable risk factors for suicide: active suicidal ideation and medication noncompliance, which we are addressing by recommending inpatient psychiatric hospitalization. Patient has following non-modifiable or demographic risk factors for suicide: psychiatric hospitalization Patient has the following protective factors against suicide: Access to outpatient mental health care and Supportive family  Thank you for this consult request. Recommendations have been communicated to  the primary team.  We will continue to follow at this time.    Thomes Lolling, NP       History of Present Illness  Relevant Aspects of Hospital ED Course:  Admitted on 04/03/2023 for hallucinations and passive suicidal ideation. She carries the psychiatric diagnoses of schizophrenia, anxiety and depression and has a past medical history of acromegaly and gigantism, thyroiditis, asthma and goiter.   Patient Report:  Heather Kramer, is seen face to face by this provider, consulted with Dr. Enedina Finner; and chart reviewed on 04/03/23.  On evaluation Heather Kramer reports she was previously on invega LAI and stopped the medication because of TD symptoms.  She was unmedicated for 3 months and just started risperidone on Tuesday of this week.  Patient states her hallucinations have gotten worse and she is having thoughts of wanting to hurt herself.    Patient says she is currently unemployed and lives with her aunt.  Her mother and brother are also local and supportive.  She says she hasn't been sleeping well; it's been an ongoing problem. As far as her appetite, she is eating well.  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   Throughout the interview, patient was responding to internal stimuli and needed to be redirected multiple times.  She struggled to maintain focus. She endorses having suicidal ideations but denies a plan. 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  During evaluation Heather Kramer is sitting in bed in no acute distress.  She is alert & oriented x 4, calm and cooperative. Her mood is depressed with congruent affect.  She has normal speech, and behavior.  Objectively there is evidence of psychosis and delusional thinking. Pt does appear to be responding to internal or external stimuli.  Patient is able to converse coherently and has goal directed thoughts; she is very distracted and appears to be responding to internal stimuli. She endorses suicidal/self-harm/homicidal ideation, psychosis, and paranoia.   Patient answered questions appropriately.    Psych ROS:  Depression: endorses Anxiety:  endorses Mania (lifetime and current): denies Psychosis: (lifetime and current): endorses   Review of Systems  Psychiatric/Behavioral:  Positive for hallucinations, substance abuse and suicidal ideas.   All other systems reviewed and are negative.    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 Occupational Hx: unemployed Legal Hx: none noted Living Situation: lives with aunt Spiritual Hx: none noted Access to weapons/lethal means: denies   Substance History Alcohol: endorses Q 2 weeks Tobacco: endorses 3 packs/ day Illicit drugs: THC - 14 days each week Prescription drug abuse: denies Rehab hx: denies  Exam Findings  Physical Exam:  Vital Signs:  Temp:  [97.6 F (36.4 C)] 97.6 F (36.4 C) (03/23 0739) Pulse Rate:  [93] 93 (03/23 0739) Resp:  [16] 16 (03/23 0739) BP: (114)/(74) 114/74 (03/23 0739) SpO2:  [100 %] 100 % (03/23 0850) Weight:  [107.5 kg] 107.5 kg (03/23 0742) Blood pressure 114/74, pulse 93, temperature 97.6 F (36.4 C),  resp. rate 16, height 6\' 1"  (1.854 m), weight 107.5 kg, last menstrual period 02/28/2023, SpO2 100%. Body mass index is 31.27 kg/m.  Physical Exam Vitals and nursing note reviewed.  Eyes:     Pupils: Pupils are equal, round, and reactive to light.  Pulmonary:     Effort: Pulmonary effort is normal.  Skin:    General: Skin is dry.   Neurological:     Mental Status: She is alert and oriented to person, place, and time.  Psychiatric:        Attention and Perception: She perceives auditory and visual hallucinations.        Mood and Affect: Mood is depressed.        Speech: Speech normal.        Behavior: Behavior is cooperative.        Thought Content: Thought content includes suicidal ideation.        Cognition and Memory: Cognition and memory normal.     Mental Status Exam: General Appearance: Casual  Orientation:  Full (Time, Place, and Person)  Memory:  Immediate;   Fair Recent;   Fair Remote;   Fair  Concentration:  Concentration: Poor  Recall:  Fair  Attention  Poor  Eye Contact:  Good  Speech:  Clear and Coherent  Language:  Good  Volume:  Normal  Mood: depressed  Affect:  Congruent  Thought Process:  Coherent  Thought Content:  Hallucinations: Auditory Visual  Suicidal Thoughts:  Yes.  without intent/plan  Homicidal Thoughts:  No  Judgement:  Impaired  Insight:  Shallow  Psychomotor Activity:  Normal  Akathisia:  No  Fund of Knowledge:  Fair      Assets:  Communication Skills Desire for Improvement Housing Leisure Time Social Support  Cognition:  WNL  ADL's:  Intact  AIMS (if indicated):        Other History   These have been pulled in through the EMR, reviewed, and updated if appropriate.  Family History:  The patient's family history includes Cancer in her maternal grandmother; Diabetes in her maternal grandfather; Heart disease in her father; Obesity in her mother.  Medical History: Past Medical History:  Diagnosis Date   Acquired acanthosis nigricans    Allergies    Asthma    Goiter    Goiter    Overweight(278.02)    Pre-diabetes    Schizophrenia (HCC)    Thyroiditis, autoimmune     Surgical History: Past Surgical History:  Procedure Laterality Date   MOUTH SURGERY       Medications:  No current facility-administered medications for this encounter.  Current  Outpatient Medications:    albuterol (VENTOLIN HFA) 108 (90 Base) MCG/ACT inhaler, Inhale 1-2 puffs into the lungs every 6 (six) hours as needed for wheezing or shortness of breath., Disp: 1 each, Rfl: 0   hydrOXYzine (ATARAX/VISTARIL) 25 MG tablet, Take 25 mg by mouth 2 (two) times daily as needed for anxiety., Disp: , Rfl:    risperiDONE (RISPERDAL) 1 MG tablet, Take 1 tablet (1 mg total) by mouth every morning for 14 days., Disp: 14 tablet, Rfl: 0   RISPERIDONE ER [place], Inject into the skin every 30 (thirty) days., Disp: , Rfl:   Allergies: Allergies  Allergen Reactions   Peanut-Containing Drug Products Anaphylaxis    Thomes Lolling, NP

## 2023-04-03 NOTE — Progress Notes (Signed)
 Pt is 28 year old AAF admitted for auditory hallucinations and desire to get back on appropriate medications.  Pt presents with flat affect but is pleasant and cooperative.  She reports that voices tell her to "shut up" and "suck my dick".  Pt states that her goal is to gain "mental stability".  Pt does not go into detail but states she will be homeless and that her family does not accept her.  Pt has acromegaly.  Pt provided with meal and oriented to unit.     04/03/23 2344  Psych Admission Type (Psych Patients Only)  Admission Status Voluntary  Psychosocial Assessment  Patient Complaints Anxiety;Depression  Eye Contact Fair  Facial Expression Flat  Affect Appropriate to circumstance  Speech Logical/coherent  Interaction Assertive  Motor Activity Other (Comment) (WDL)  Appearance/Hygiene In scrubs  Behavior Characteristics Cooperative;Appropriate to situation  Mood Depressed  Thought Process  Coherency WDL  Content WDL  Delusions None reported or observed  Perception Hallucinations  Hallucination Auditory  Judgment Impaired  Confusion None  Danger to Self  Current suicidal ideation? Denies  Agreement Not to Harm Self Yes  Description of Agreement verbal  Danger to Others  Danger to Others None reported or observed

## 2023-04-03 NOTE — Tx Team (Signed)
 Initial Treatment Plan 04/03/2023 11:54 PM EMONY DORMER ZOX:[id number]    PATIENT STRESSORS: Financial difficulties   Marital or family conflict   Medication change or noncompliance     PATIENT STRENGTHS: Average or above average intelligence  Communication skills  Physical Health    PATIENT IDENTIFIED PROBLEMS: Depression   Psychosis  Suicidal Ideation                 DISCHARGE CRITERIA:  Improved stabilization in mood, thinking, and/or behavior Medical problems require only outpatient monitoring Motivation to continue treatment in a less acute level of care Need for constant or close observation no longer present Verbal commitment to aftercare and medication compliance  PRELIMINARY DISCHARGE PLAN: Attend aftercare/continuing care group Outpatient therapy Placement in alternative living arrangements  PATIENT/FAMILY INVOLVEMENT: This treatment plan has been presented to and reviewed with the patient, Heather Kramer,.  The patient and family have been given the opportunity to ask questions and make suggestions.  Juliann Pares, RN 04/03/2023, 11:54 PM

## 2023-04-03 NOTE — ED Triage Notes (Addendum)
 Pt here for hearing voices endorsing SI and that she was going to die. This is ongoing for 6 yrs but worsened over the last week.  Works with ACT team. Pt has a doctor that comes to eval. But the doctor hasn't been to see her yet.  Used to be on Invega shot but was developing symptoms of TD. Currently takes Amantadine and Risperidone. PT was off meds for 3 months per family but restarted on Tuesday.  When asked about pain pt endorses pain in lungs when breathing out x 1 month.  Pt smokes.

## 2023-04-03 NOTE — ED Notes (Signed)
 Patient clothes in [place] [address]. Family have patient phone and keys

## 2023-04-03 NOTE — ED Notes (Signed)
 Report was given at 1952 to Langley Gauss, RN from [facility].

## 2023-04-03 NOTE — ED Notes (Signed)
 Patient is very tear

## 2023-04-04 ENCOUNTER — Telehealth (HOSPITAL_COMMUNITY): Payer: Self-pay | Admitting: Pharmacy Technician

## 2023-04-04 ENCOUNTER — Encounter (HOSPITAL_COMMUNITY): Payer: Self-pay

## 2023-04-04 ENCOUNTER — Other Ambulatory Visit (HOSPITAL_COMMUNITY): Payer: Self-pay

## 2023-04-04 DIAGNOSIS — F17200 Nicotine dependence, unspecified, uncomplicated: Secondary | ICD-10-CM | POA: Diagnosis present

## 2023-04-04 DIAGNOSIS — G4700 Insomnia, unspecified: Secondary | ICD-10-CM | POA: Diagnosis present

## 2023-04-04 DIAGNOSIS — F419 Anxiety disorder, unspecified: Secondary | ICD-10-CM | POA: Insufficient documentation

## 2023-04-04 DIAGNOSIS — F200 Paranoid schizophrenia: Secondary | ICD-10-CM | POA: Diagnosis not present

## 2023-04-04 DIAGNOSIS — F1220 Cannabis dependence, uncomplicated: Secondary | ICD-10-CM | POA: Diagnosis present

## 2023-04-04 DIAGNOSIS — F32A Depression, unspecified: Secondary | ICD-10-CM | POA: Insufficient documentation

## 2023-04-04 LAB — HIV ANTIBODY (ROUTINE TESTING W REFLEX): HIV Screen 4th Generation wRfx: NONREACTIVE

## 2023-04-04 NOTE — Group Note (Signed)
 Recreation Therapy Group Note   Group Topic:Leisure Education  Group Date: 04/04/2023 Start Time: 0930 End Time: 1000 Facilitators: Genavive Kubicki-McCall, LRT,CTRS Location: [address] Dayroom   Group Topic: Leisure Education  Goal Area(s) Addresses:  Patient will identify positive leisure activities for use post discharge. Patient will identify at least one positive benefit of participation in leisure activities.   Intervention: Innovation, Group Presentation   Activity: LRT and patients set room in a way that would be effective in completing activity. Patients were seated in a circle and given a beach ball. Patients were instructed they were to hit the ball back and forth to each other as if playing volleyball. LRT would time the patients as they played the game. The ball could bounce or roll but not come to a complete stop. It the ball came to a stop at any time, the timer would reset and start over. Patients were to remain seated throughout activity but could get up if the ball escaped the circle at any point.  Education:  Leisure Scientist, physiological, Special educational needs teacher, Teamwork, Discharge Planning  Education Outcome: Acknowledges education/In group clarification offered/Needs additional education.    Affect/Mood: Appropriate   Participation Level: Engaged   Participation Quality: Independent   Behavior: Appropriate   Speech/Thought Process: Focused   Insight: Good   Judgement: Good   Modes of Intervention: Cooperative Play   Patient Response to Interventions:  Engaged   Education Outcome:  In group clarification offered    Clinical Observations/Individualized Feedback: Pt was quiet for the most part but would express herself at other times. Pt was engaged and would crack a smile at times during the activity as well.     Plan: Continue to engage patient in RT group sessions 2-3x/week.   Ashtynn Berke-McCall, LRT,CTRS 04/04/2023 12:11 PM

## 2023-04-04 NOTE — Group Note (Signed)
 Date:  04/04/2023 Time:  11:18 PM  Group Topic/Focus:  Alcoholics Anonymous (AA) Meeting    Participation Level:  Did Not Attend  Participation Quality:   n/a  Affect:   n/a  Cognitive:   n/a  Insight: None  Engagement in Group:   n/a  Modes of Intervention:   n/a  Additional Comments:  Patient did not attend AA  Kennieth Francois 04/04/2023, 11:18 PM

## 2023-04-04 NOTE — Plan of Care (Signed)
   Problem: Safety: Goal: Periods of time without injury will increase Outcome: Progressing   Problem: Medication: Goal: Compliance with prescribed medication regimen will improve Outcome: Progressing   Problem: Coping: Goal: Ability to demonstrate self-control will improve Outcome: Progressing

## 2023-04-04 NOTE — Group Note (Signed)
 Occupational Therapy Group Note  Group Topic:Communication  Group Date: 04/04/2023 Start Time: 1422 End Time: 1500 Facilitators: Ted Mcalpine, OT   Group Description: Group encouraged increased engagement and participation through discussion focused on communication styles. Patients were educated on the different styles of communication including passive, aggressive, assertive, and passive-aggressive communication. Group members shared and reflected on which styles they most often find themselves communicating in and brainstormed strategies on how to transition and practice a more assertive approach. Further discussion explored how to use assertiveness skills and strategies to further advocate and ask questions as it relates to their treatment plan and mental health.   Therapeutic Goal(s): Identify practical strategies to improve communication skills  Identify how to use assertive communication skills to address individual needs and wants   Participation Level: Engaged   Participation Quality: Independent   Behavior: Appropriate   Speech/Thought Process: Relevant   Affect/Mood: Appropriate   Insight: Fair   Judgement: Fair      Modes of Intervention: Education  Patient Response to Interventions:  Attentive   Plan: Continue to engage patient in OT groups 2 - 3x/week.  04/04/2023  Ted Mcalpine, OT  Kerrin Champagne, OT

## 2023-04-04 NOTE — Progress Notes (Signed)
 Pt got up and came down hall, Pt stated "I'm not doing very well".  Pt did not want to elaborate on what she meant.  Pt denied need for medication, did not want snack/beverage.  Pt assured of safety.  Pt stepped back from RN and appeared paranoid.  Pt returned to room at this time, will closely monitor.  Pt is experiencing auditory hallucinations.

## 2023-04-04 NOTE — Group Note (Signed)
 Date:  04/04/2023 Time:  4:25 PM  Group Topic/Focus:  Goals Group:   The focus of this group is to help patients establish daily goals to achieve during treatment and discuss how the patient can incorporate goal setting into their daily lives to aide in recovery. Orientation:   The focus of this group is to educate the patient on the purpose and policies of crisis stabilization and provide a format to answer questions about their admission.  The group details unit policies and expectations of patients while admitted.    Participation Level:  Minimal  Participation Quality:  Attentive  Affect:  Appropriate  Cognitive:  Appropriate  Insight: None  Engagement in Group:  Limited  Modes of Intervention:  Discussion and Orientation  Additional Comments:   Pt attended the Orientation and Goals group.  Heather Kramer 04/04/2023, 4:25 PM

## 2023-04-04 NOTE — Plan of Care (Signed)
   Problem: Education: Goal: Knowledge of [place] [facility] Education information/materials will improve Outcome: Progressing

## 2023-04-04 NOTE — BH IP Treatment Plan (Signed)
 Interdisciplinary Treatment and Diagnostic Plan Update  04/04/2023 Time of Session: 1050am Heather Kramer MRN: [id number]  Principal Diagnosis: Schizophrenia [facility])  Secondary Diagnoses: Principal Problem:   Schizophrenia (HCC)   Current Medications:  Current Facility-Administered Medications  Medication Dose Route Frequency Provider Last Rate Last Admin   acetaminophen (TYLENOL) tablet 650 mg  650 mg Oral Q6H PRN Weber, Kyra A, NP       albuterol (VENTOLIN HFA) 108 (90 Base) MCG/ACT inhaler 1-2 puff  1-2 puff Inhalation Q6H PRN Weber, Kyra A, NP       alum & mag hydroxide-simeth (MAALOX/MYLANTA) 200-200-20 MG/5ML suspension 30 mL  30 mL Oral Q4H PRN Weber, Kyra A, NP       amantadine (SYMMETREL) capsule 100 mg  100 mg Oral BID Weber, Kyra A, NP   100 mg at 04/04/23 0743   haloperidol (HALDOL) tablet 5 mg  5 mg Oral TID PRN Weber, Kyra A, NP       And   diphenhydrAMINE (BENADRYL) capsule 50 mg  50 mg Oral TID PRN Weber, Kyra A, NP       haloperidol lactate (HALDOL) injection 5 mg  5 mg Intramuscular TID PRN Weber, Kyra A, NP       And   diphenhydrAMINE (BENADRYL) injection 50 mg  50 mg Intramuscular TID PRN Weber, Bella Kennedy A, NP       And   LORazepam (ATIVAN) injection 2 mg  2 mg Intramuscular TID PRN Weber, Kyra A, NP       haloperidol lactate (HALDOL) injection 10 mg  10 mg Intramuscular TID PRN Weber, Kyra A, NP       And   diphenhydrAMINE (BENADRYL) injection 50 mg  50 mg Intramuscular TID PRN Weber, Kyra A, NP       And   LORazepam (ATIVAN) injection 2 mg  2 mg Intramuscular TID PRN Weber, Kyra A, NP       magnesium hydroxide (MILK OF MAGNESIA) suspension 30 mL  30 mL Oral Daily PRN Weber, Kyra A, NP       nicotine (NICODERM CQ - dosed in mg/24 hours) patch 21 mg  21 mg Transdermal Daily Bobbitt, Shalon E, NP   21 mg at 04/04/23 0743   pneumococcal 20-valent conjugate vaccine (PREVNAR 20) injection 0.5 mL  0.5 mL Intramuscular Tomorrow-1000 Bobbitt, Shalon E, NP       risperiDONE  (RISPERDAL M-TABS) disintegrating tablet 4 mg  4 mg Oral QHS Weber, Kyra A, NP       PTA Medications: Medications Prior to Admission  Medication Sig Dispense Refill Last Dose/Taking   albuterol (VENTOLIN HFA) 108 (90 Base) MCG/ACT inhaler Inhale 1-2 puffs into the lungs every 6 (six) hours as needed for wheezing or shortness of breath. 1 each 0    amantadine (SYMMETREL) 100 MG capsule Take 100 mg by mouth 2 (two) times daily.      hydrOXYzine (ATARAX/VISTARIL) 25 MG tablet Take 25 mg by mouth 2 (two) times daily as needed for anxiety. (Patient not taking: Reported on 04/03/2023)      risperidone (RISPERDAL) 4 MG tablet Take 4 mg by mouth at bedtime.      RISPERIDONE ER [place] Inject into the skin every 30 (thirty) days. (Patient not taking: Reported on 04/03/2023)       Patient Stressors: Financial difficulties   Marital or family conflict   Medication change or noncompliance    Patient Strengths: Average or above average intelligence  Communication skills  Physical Health  Treatment Modalities: Medication Management, Group therapy, Case management,  1 to 1 session with clinician, Psychoeducation, Recreational therapy.   Physician Treatment Plan for Primary Diagnosis: Schizophrenia (HCC) Long Term Goal(s):     Short Term Goals:    Medication Management: Evaluate patient's response, side effects, and tolerance of medication regimen.  Therapeutic Interventions: 1 to 1 sessions, Unit Group sessions and Medication administration.  Evaluation of Outcomes: Not Progressing  Physician Treatment Plan for Secondary Diagnosis: Principal Problem:   Schizophrenia (HCC)  Long Term Goal(s):     Short Term Goals:       Medication Management: Evaluate patient's response, side effects, and tolerance of medication regimen.  Therapeutic Interventions: 1 to 1 sessions, Unit Group sessions and Medication administration.  Evaluation of Outcomes: Not Progressing   RN Treatment Plan for Primary  Diagnosis: Schizophrenia (HCC) Long Term Goal(s): Knowledge of disease and therapeutic regimen to maintain health will improve  Short Term Goals: Ability to remain free from injury will improve, Ability to verbalize frustration and anger appropriately will improve, Ability to demonstrate self-control, Ability to participate in decision making will improve, Ability to verbalize feelings will improve, Ability to disclose and discuss suicidal ideas, Ability to identify and develop effective coping behaviors will improve, and Compliance with prescribed medications will improve  Medication Management: RN will administer medications as ordered by provider, will assess and evaluate patient's response and provide education to patient for prescribed medication. RN will report any adverse and/or side effects to prescribing provider.  Therapeutic Interventions: 1 on 1 counseling sessions, Psychoeducation, Medication administration, Evaluate responses to treatment, Monitor vital signs and CBGs as ordered, Perform/monitor CIWA, COWS, AIMS and Fall Risk screenings as ordered, Perform wound care treatments as ordered.  Evaluation of Outcomes: Not Progressing   LCSW Treatment Plan for Primary Diagnosis: Schizophrenia (HCC) Long Term Goal(s): Safe transition to appropriate next level of care at discharge, Engage patient in therapeutic group addressing interpersonal concerns.  Short Term Goals: Engage patient in aftercare planning with referrals and resources, Increase social support, Increase ability to appropriately verbalize feelings, Increase emotional regulation, Facilitate acceptance of mental health diagnosis and concerns, Facilitate patient progression through stages of change regarding substance use diagnoses and concerns, Identify triggers associated with mental health/substance abuse issues, and Increase skills for wellness and recovery  Therapeutic Interventions: Assess for all discharge needs, 1 to 1  time with Social worker, Explore available resources and support systems, Assess for adequacy in community support network, Educate family and significant other(s) on suicide prevention, Complete Psychosocial Assessment, Interpersonal group therapy.  Evaluation of Outcomes: Not Progressing   Progress in Treatment: Attending groups: Yes. Participating in groups: Yes. Taking medication as prescribed: Yes. Toleration medication: Yes. Family/Significant other contact made: No, will contact:  consents pending Patient understands diagnosis: Yes. Discussing patient identified problems/goals with staff: Yes. Medical problems stabilized or resolved: Yes. Denies suicidal/homicidal ideation: Yes. Issues/concerns per patient self-inventory: No.  New problem(s) identified: No, Describe:  none  New Short Term/Long Term Goal(s): medication stabilization, elimination of SI thoughts, development of comprehensive mental wellness plan.    Patient Goals:  "Work on my mental stability"  Discharge Plan or Barriers: Patient recently admitted. CSW will continue to follow and assess for appropriate referrals and possible discharge planning.    Reason for Continuation of Hospitalization: Anxiety Hallucinations Medication stabilization  Estimated Length of Stay: 5-7 days  Last 3 [place] Suicide Severity Risk Score: Flowsheet Row [facility] (Current[facility] Most recent reading  at 04/03/2023 11:27 PM ED from 04/03/2023 in [facility] at [facility] Most recent reading at 04/03/2023  7:48 AM ED from 10/19/2020 in [facility] at [facility] Most recent reading at 10/19/2020  1:00 PM  C-SSRS RISK CATEGORY Low Risk Low Risk No Risk       Last PHQ 2/9 Scores:     No data to display          Scribe for Treatment Team: Kathi Der, LCSWA 04/04/2023 2:21 PM

## 2023-04-04 NOTE — Progress Notes (Signed)
   04/04/23 0533  15 Minute Checks  Location Bedroom  Visual Appearance Calm  Behavior Sleeping  Sleep (Behavioral Health Patients Only)  Calculate sleep? (Click Yes once per 24 hr at 0600 safety check) Yes  Documented sleep last 24 hours 4.75

## 2023-04-04 NOTE — Group Note (Signed)
 Date:  04/04/2023 Time:  4:59 PM  Group Topic/Focus:  Self Care:   The focus of this group is to help patients understand the importance of self-care in order to improve or restore emotional, physical, spiritual, interpersonal, and financial health.    Participation Level:  Did Not Attend  Participation Quality:   n/a  Affect:   n/a  Cognitive:   n/a  Insight: None  Engagement in Group:   n/a  Modes of Intervention:   n/a  Additional Comments:   Did not attend  Lynee Rosenbach M Diane Hanel 04/04/2023, 4:59 PM
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
## 2023-04-04 NOTE — Progress Notes (Signed)
 Patient rated her depression level 10/10 and her anxiety level 10/10 with 10 being the highest and 0 none. Pt identified her goal for today as, " Mental stability". Medication and group compliant. Pt observed laughing and singing loudly, talking to herself as if responding to internal stimuli. Appetite good on shift. Safety maintained.  04/04/23 1610  Psychosocial Assessment  Patient Complaints Anxiety;Depression  Eye Contact Fair  Facial Expression Flat  Affect Appropriate to circumstance  Speech Logical/coherent  Interaction Assertive  Motor Activity Slow  Appearance/Hygiene In scrubs  Behavior Characteristics Cooperative;Appropriate to situation  Thought Process  Coherency WDL  Content WDL  Delusions None reported or observed  Perception Hallucinations  Hallucination Auditory ("People I know taking to me".)  Judgment Impaired  Confusion None  Danger to Self  Current suicidal ideation? Passive  Agreement Not to Harm Self Yes  Description of Agreement Verbal  Danger to Others  Danger to Others None reported or observed

## 2023-04-04 NOTE — Telephone Encounter (Signed)
 Patient Product/process development scientist completed.    The patient is insured through [employer]. Patient has Medicare and is not eligible for a copay card, but may be able to apply for patient assistance or Medicare RX Payment Plan (Patient Must reach out to their plan, if eligible for payment plan), if available.    Ran test claim for Ingrezza 40 mg and the current 30 day co-pay is $4.80.   This test claim was processed through [facility]- copay amounts may vary at other pharmacies due to pharmacy/plan contracts, or as the patient moves through the different stages of their insurance plan.     Roland Earl, CPHT Pharmacy Technician III Certified Patient Advocate [facility] Patient Advocate Team Direct Number: [phone]  Fax: [phone]

## 2023-04-04 NOTE — Plan of Care (Signed)

## 2023-04-04 NOTE — Group Note (Signed)
 Date:  04/04/2023 Time:  4:45 PM  Group Topic/Focus:  Emotional Education:   The focus of this group is to discuss what feelings/emotions are, and how they are experienced. Managing Feelings:   The focus of this group is to identify what feelings patients have difficulty handling and develop a plan to handle them in a healthier way upon discharge.    Participation Level:  Did Not Attend  Participation Quality:   n/a  Affect:   n/a  Cognitive:  n/a  Insight: None  Engagement in Group:   n/a  Modes of Intervention:   n/a  Additional Comments:   Did not attend  Kachina Niederer M Arryanna Holquin 04/04/2023, 4:45 PM

## 2023-04-05 DIAGNOSIS — F200 Paranoid schizophrenia: Secondary | ICD-10-CM | POA: Diagnosis not present

## 2023-04-05 LAB — LIPID PANEL
Cholesterol: 163 mg/dL (ref 0–200)
HDL: 38 mg/dL — ABNORMAL LOW (ref >40)
LDL Cholesterol: 108 mg/dL — ABNORMAL HIGH (ref 0–99)
Total CHOL/HDL Ratio: 4.3 ratio
Triglycerides: 87 mg/dL (ref <150)
VLDL: 17 mg/dL (ref 0–40)

## 2023-04-05 LAB — HEMOGLOBIN A1C
Hgb A1c MFr Bld: 5.1 % (ref 4.8–5.6)
Mean Plasma Glucose: 99.67 mg/dL

## 2023-04-05 LAB — GC/CHLAMYDIA PROBE AMP (~~LOC~~) NOT AT ARMC
Chlamydia: NEGATIVE
Comment: NEGATIVE
Comment: NORMAL
Neisseria Gonorrhea: NEGATIVE

## 2023-04-05 LAB — TSH: TSH: 2.311 u[IU]/mL (ref 0.350–4.500)

## 2023-04-05 LAB — VITAMIN D 25 HYDROXY (VIT D DEFICIENCY, FRACTURES): Vit D, 25-Hydroxy: 10.42 ng/mL — ABNORMAL LOW (ref 30–100)

## 2023-04-05 LAB — VITAMIN B12: Vitamin B-12: 251 pg/mL (ref 180–914)

## 2023-04-05 LAB — RPR: RPR Ser Ql: NONREACTIVE

## 2023-04-05 MED ORDER — VITAMIN D (ERGOCALCIFEROL) 1.25 MG (50000 UNIT) PO CAPS
50000.0000 [IU] | ORAL_CAPSULE | ORAL | Status: DC
  Administered 2023-04-05 – 2023-04-12 (×2): 50000 [IU] via ORAL
  Filled 2023-04-05 (×3): qty 1

## 2023-04-05 MED ORDER — VALBENAZINE TOSYLATE 40 MG PO CAPS
40.0000 mg | ORAL_CAPSULE | Freq: Every day | ORAL | Status: DC
  Administered 2023-04-05 – 2023-04-12 (×8): 40 mg via ORAL
  Filled 2023-04-05 (×11): qty 1

## 2023-04-05 MED ORDER — TRAZODONE HCL 50 MG PO TABS
50.0000 mg | ORAL_TABLET | Freq: Every evening | ORAL | Status: DC | PRN
  Administered 2023-04-05 – 2023-04-11 (×7): 50 mg via ORAL
  Filled 2023-04-05 (×8): qty 1

## 2023-04-05 MED ORDER — AMANTADINE HCL 100 MG PO CAPS
100.0000 mg | ORAL_CAPSULE | Freq: Every day | ORAL | Status: AC
  Administered 2023-04-06 – 2023-04-07 (×2): 100 mg via ORAL
  Filled 2023-04-05 (×3): qty 1
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
## 2023-04-05 NOTE — BHH Group Notes (Signed)

## 2023-04-05 NOTE — Progress Notes (Signed)
 [facility] MD Progress Note  04/05/2023 3:09 PM Heather Kramer  MRN:  [id number] Principal Problem: Paranoid schizophrenia (HCC) Diagnosis: Principal Problem:   Paranoid schizophrenia (HCC) Active Problems:   Psychosis (HCC)   Depression   Anxiety   Delta-9-tetrahydrocannabinol (THC) dependence (HCC)   Tobacco use disorder   Insomnia  HPI: Heather Kramer is a 28 y.o. female with a prior mental health diagnosis of schizophrenia who presented to the [facility] ED on 03/23 with complaints of worsening auditory hallucinations commanding in nature, telling her to end her life. As per ED documentation, pt was non compliant with meds, but restarted them last week.  Patient was medically cleared prior to being transferred to this behavioral health Hospital voluntarily for treatment on stabilization of her mental status. As per chart review, patient was last hospitalized at this hospital on 07/28/2019. Reason for presentation at that time was paranoia, with pt thinking that her college is plotting against her.   Patient assessment: Chart reviewed, patient's case discussed with her treatment team.  Vital signs are within normal limits, no behavioral concerns in the past 24 hours.  Patient is medication compliant, only as needed in the past 24 hours with Tylenol.  Slept for 7.5 hours last night as per nursing documentation.  During encounter with patient, psychosis is persistent; patient is suspicious, looking around the room, looking suspiciously as Clinical research associate jots on paper findings from the assessment; she reports auditory hallucinations of voices which have been unchanged since yesterday, she states that the voices are telling her to "shut up".  She presents with paranoia, states that the people to whom the voices belong out to harm her, states that the voices are loud in volume.  She reports that the voices are unchanged from yesterday.  Denies visual hallucinations, denies HI, presents with thought broadcasting;  states that everybody knows what she is thinking, because her thoughts are so loud, and everybody can hear them.  Patient continues to present with passive SI, denies having a plan, denies any intent to harm herself, verbally contracting for safety on the unit.  Mood is depressed, affect is congruent.  She reports having a low energy level, concentration as per objective assessment is poor, patient continues to state throughout assessment that she does not want to answer any more questions.  She reports a good appetite, reports a fair sleep quality last night.    Patient is currently on Risperdal 4 mg, which is her home medication, she was on amantadine for involuntary movements to her extremities, and due to concerns for complications related to the sudden stop of this medication, we will give 2 more doses of Amantadine 100 mg, then stop, and continue Ingrezza 40 mg daily for TD.  Today, patient is observed to have involuntary movements to her lip area, has some moderate tremors to her tongue, moderate amounts of involuntary movements to bilateral fingers. AIMS: 7. As per the [facility] pharmacist, this medication will cost her $4.80 as copay after insurance.   Total Time spent with patient: 45 minutes  Past Psychiatric History: See H & P  Past Medical History:  Past Medical History:  Diagnosis Date   Acquired acanthosis nigricans    Allergies    Asthma    Goiter    Goiter    Overweight(278.02)    Pre-diabetes    Schizophrenia (HCC)    Thyroiditis, autoimmune     Past Surgical History:  Procedure Laterality Date   MOUTH SURGERY  Family History:  Family History  Problem Relation Age of Onset   Obesity Mother    Heart disease Father    Cancer Maternal Grandmother    Diabetes Maternal Grandfather    Family Psychiatric  History: See H & P Social History:  Social History   Substance and Sexual Activity  Alcohol Use Yes   Comment: occasionally     Social History   Substance and  Sexual Activity  Drug Use Not Currently   Types: Marijuana    Social History   Socioeconomic History   Marital status: Single    Spouse name: Not on file   Number of children: Not on file   Years of education: Not on file   Highest education level: 12th grade  Occupational History   Not on file  Tobacco Use   Smoking status: Every Day    Current packs/day: 2.00    Types: Cigarettes   Smokeless tobacco: Never  Vaping Use   Vaping status: Never Used  Substance and Sexual Activity   Alcohol use: Yes    Comment: occasionally   Drug use: Not Currently    Types: Marijuana   Sexual activity: Not Currently  Other Topics Concern   Not on file  Social History Narrative   Lives with mother and brother. 9th grade at [employer]. Plays basketball.    Social Drivers of Corporate investment banker Strain: Not on File (10/14/2021)   Received from [employer] [facility]    Financial Resource Strain: 0  Food Insecurity: Not on File (10/07/2022)   Received from [facility] Insecurity    Food: 0  Transportation Needs: Patient Declined (04/03/2023)   PRAPARE - Administrator, Civil Service (Medical): Patient declined    Lack of Transportation (Non-Medical): Patient declined  Physical Activity: Not on File (10/14/2021)   Received from [facility]   Physical Activity    Physical Activity: 0  Stress: Not on File (10/14/2021)   Received from [facility]   Stress    Stress: 0  Social Connections: Not on File (09/20/2022)   Received from [facility]   Social Connections    Connectedness: 0   Sleep: Poor  Appetite:  Good  Current Medications: Current Facility-Administered Medications  Medication Dose Route Frequency Provider Last Rate Last Admin   acetaminophen (TYLENOL) tablet 650 mg  650 mg Oral Q6H PRN Weber, Kyra A, NP   650 mg at 04/04/23 2056   albuterol (VENTOLIN HFA) 108 (90 Base) MCG/ACT inhaler 1-2 puff  1-2 puff Inhalation Q6H PRN Weber, Kyra A, NP        alum & mag hydroxide-simeth (MAALOX/MYLANTA) 200-200-20 MG/5ML suspension 30 mL  30 mL Oral Q4H PRN Weber, Leavy Cella, NP       [START ON 04/06/2023] amantadine (SYMMETREL) capsule 100 mg  100 mg Oral Daily Massengill, Nathan, MD       haloperidol (HALDOL) tablet 5 mg  5 mg Oral TID PRN Weber, Kyra A, NP       And   diphenhydrAMINE (BENADRYL) capsule 50 mg  50 mg Oral TID PRN Weber, Kyra A, NP       haloperidol lactate (HALDOL) injection 5 mg  5 mg Intramuscular TID PRN Weber, Kyra A, NP       And   diphenhydrAMINE (BENADRYL) injection 50 mg  50 mg Intramuscular TID PRN Weber, Leavy Cella, NP       And   LORazepam (ATIVAN)  injection 2 mg  2 mg Intramuscular TID PRN Weber, Bella Kennedy A, NP       haloperidol lactate (HALDOL) injection 10 mg  10 mg Intramuscular TID PRN Weber, Bella Kennedy A, NP       And   diphenhydrAMINE (BENADRYL) injection 50 mg  50 mg Intramuscular TID PRN Weber, Bella Kennedy A, NP       And   LORazepam (ATIVAN) injection 2 mg  2 mg Intramuscular TID PRN Weber, Bella Kennedy A, NP       magnesium hydroxide (MILK OF MAGNESIA) suspension 30 mL  30 mL Oral Daily PRN Weber, Kyra A, NP       nicotine (NICODERM CQ - dosed in mg/24 hours) patch 21 mg  21 mg Transdermal Daily Bobbitt, Shalon E, NP   21 mg at 04/05/23 0749   pneumococcal 20-valent conjugate vaccine (PREVNAR 20) injection 0.5 mL  0.5 mL Intramuscular Tomorrow-1000 Bobbitt, Shalon E, NP       risperiDONE (RISPERDAL M-TABS) disintegrating tablet 4 mg  4 mg Oral QHS Weber, Kyra A, NP   4 mg at 04/04/23 2055   traZODone (DESYREL) tablet 50 mg  50 mg Oral QHS PRN Starleen Blue, NP       valbenazine (INGREZZA) capsule 40 mg  40 mg Oral Daily Zhi Geier, NP   40 mg at 04/05/23 1041   Vitamin D (Ergocalciferol) (DRISDOL) 1.25 MG (50000 UNIT) capsule 50,000 Units  50,000 Units Oral Q7 days Starleen Blue, NP   50,000 Units at 04/05/23 1351    Lab Results:  Results for orders placed or performed during the hospital encounter of 04/03/23 (from the past 48  hours)  RPR     Status: None   Collection Time: 04/04/23  6:34 PM  Result Value Ref Range   RPR Ser Ql NON REACTIVE NON REACTIVE    Comment: Performed at [facility], [address] [place], [place] [address]  HIV Antibody (routine testing w rflx)     Status: None   Collection Time: 04/04/23  6:34 PM  Result Value Ref Range   HIV Screen 4th Generation wRfx Non Reactive Non Reactive    Comment: Performed at [facility], [address] [place], [place] [address]  Hemoglobin A1c     Status: None   Collection Time: 04/05/23  6:48 AM  Result Value Ref Range   Hgb A1c MFr Bld 5.1 4.8 - 5.6 %    Comment: (NOTE) Pre diabetes:          5.7%-6.4%  Diabetes:              >6.4%  Glycemic control for   <7.0% adults with diabetes    Mean Plasma Glucose 99.67 mg/dL    Comment: Performed at [facility], [address] [place], [place] [address]  Lipid panel     Status: Abnormal   Collection Time: 04/05/23  6:48 AM  Result Value Ref Range   Cholesterol 163 0 - 200 mg/dL   Triglycerides 87 <644 mg/dL   HDL 38 (L) >03 mg/dL   Total CHOL/HDL Ratio 4.3 RATIO   VLDL 17 0 - 40 mg/dL   LDL Cholesterol 474 (H) 0 - 99 mg/dL    Comment:        Total Cholesterol/HDL:CHD Risk Coronary Heart Disease Risk Table                     Men   Women  1/2 Average  Risk   3.4   3.3  Average Risk       5.0   4.4  2 X Average Risk   9.6   7.1  3 X Average Risk  23.4   11.0        Use the calculated Patient Ratio above and the CHD Risk Table to determine the patient's CHD Risk.        ATP III CLASSIFICATION (LDL):  <100     mg/dL   Optimal  409-811  mg/dL   Near or Above                    Optimal  130-159  mg/dL   Borderline  914-782  mg/dL   High  >956     mg/dL   Very High Performed at [facility], [address] [place], [place] [address]   TSH     Status: None   Collection Time: 04/05/23  6:48 AM  Result Value Ref Range   TSH 2.311 0.350 - 4.500 uIU/mL     Comment: Performed by a 3rd Generation assay with a functional sensitivity of <=0.01 uIU/mL. Performed at [facility], [address], [place], [place] [address]   VITAMIN D 25 Hydroxy (Vit-D Deficiency, Fractures)     Status: Abnormal   Collection Time: 04/05/23  6:48 AM  Result Value Ref Range   Vit D, 25-Hydroxy 10.42 (L) 30 - 100 ng/mL    Comment: (NOTE) Vitamin D deficiency has been defined by the Institute of Medicine  and an Endocrine Society practice guideline as a level of serum 25-OH  vitamin D less than 20 ng/mL (1,2). The Endocrine Society went on to  further define vitamin D insufficiency as a level between 21 and 29  ng/mL (2).  1. IOM (Institute of Medicine). 2010. Dietary reference intakes for  calcium and D. [place]: [employer]. 2. Holick MF, Binkley McAllen, Bischoff-Ferrari HA, et al. Evaluation,  treatment, and prevention of vitamin D deficiency: an Endocrine  Society clinical practice guideline, JCEM. 2011 Jul; 96(7): 1911-30.  Performed at [facility], [address], [place], [place] [address]   Vitamin B12     Status: None   Collection Time: 04/05/23  6:48 AM  Result Value Ref Range   Vitamin B-12 251 180 - 914 pg/mL    Comment: (NOTE) This assay is not validated for testing neonatal or myeloproliferative syndrome specimens for Vitamin B12 levels. Performed at [facility], [address], [place], [place] [address]     Blood Alcohol level:  Lab Results  Component Value Date   [facility] <10 04/03/2023   ETH <10 06/13/2020    Metabolic Disorder Labs: Lab Results  Component Value Date   HGBA1C 5.1 04/05/2023   MPG 99.67 04/05/2023   MPG 103 06/13/2020   Lab Results  Component Value Date   PROLACTIN 26.5 (H) 06/13/2020   PROLACTIN 16.6 07/28/2019   Lab Results  Component Value Date   CHOL 163 04/05/2023   TRIG 87 04/05/2023   HDL 38 (L) 04/05/2023   CHOLHDL 4.3 04/05/2023    VLDL 17 04/05/2023   LDLCALC 108 (H) 04/05/2023   LDLCALC 77 06/13/2020   Physical Findings: AIMS: 7 CIWA: n/a COWS:  n/a  Musculoskeletal: Strength & Muscle Tone: within normal limits Gait & Station: normal Patient leans: N/A  Psychiatric Specialty Exam:  Presentation  General Appearance:  Casual  Eye Contact:  Fair  Speech: Normal Rate  Speech Volume: Decreased  Handedness: Right   Mood and Affect  Mood: Depressed; Anxious  Affect: Congruent   Thought Process  Thought Processes: Coherent  Descriptions of Associations:Intact  Orientation:Full (Time, Place and Person)  Thought Content:Illogical  History of Schizophrenia/Schizoaffective disorder:No data recorded Duration of Psychotic Symptoms:No data recorded Hallucinations:Hallucinations: Auditory; Command Description of Command Hallucinations: "shut up" Description of Auditory Hallucinations: multiple voices inside her head  Ideas of Reference:Paranoia; Delusions; Percusatory  Suicidal Thoughts:Suicidal Thoughts: Yes, Passive SI Active Intent and/or Plan: Without Intent; Without Plan  Homicidal Thoughts:Homicidal Thoughts: No   Sensorium  Memory: Immediate Poor  Judgment: Poor  Insight: Fair   Chartered certified accountant: Fair  Attention Span: Fair  Recall: [employer] of Knowledge: Fair  Language: Fair   Psychomotor Activity  Psychomotor Activity: Psychomotor Activity: Other (comment) (TD)   Assets  Assets: Resilience   Sleep  Sleep: Sleep: Poor    Physical Exam: Physical Exam Constitutional:      Appearance: She is obese.  Musculoskeletal:     Cervical back: Normal range of motion.  Neurological:     General: No focal deficit present.     Mental Status: She is alert and oriented to person, place, and time.    Review of Systems  Psychiatric/Behavioral:  Positive for depression, hallucinations, substance abuse and suicidal ideas (Denies  having a  plan/intent). Negative for memory loss. The patient is nervous/anxious and has insomnia.    Blood pressure 113/85, pulse 95, temperature 97.6 F (36.4 C), temperature source Oral, resp. rate 18, height 6\' 1"  (1.854 m), weight 107.5 kg, last menstrual period 02/28/2023, SpO2 100%. Body mass index is 31.27 kg/m.  Treatment Plan Summary: Daily contact with patient to assess and evaluate symptoms and progress in treatment and Medication management   Safety and Monitoring: Voluntary admission to inpatient psychiatric unit for safety, stabilization and treatment Daily contact with patient to assess and evaluate symptoms and progress in treatment Patient's case to be discussed in multi-disciplinary team meeting Observation Level : q15 minute checks Vital signs: q12 hours Precautions: Safety   Long Term Goal(s): Improvement in symptoms so as ready for discharge   Short Term Goals: Ability to identify changes in lifestyle to reduce recurrence of condition will improve, Ability to verbalize feelings will improve, Ability to disclose and discuss suicidal ideas, Ability to demonstrate self-control will improve, Ability to identify and develop effective coping behaviors will improve, Ability to maintain clinical measurements within normal limits will improve, Compliance with prescribed medications will improve, and Ability to identify triggers associated with substance abuse/mental health issues will improve   Diagnoses Principal Problem:   Paranoid schizophrenia (HCC) Active Problems:   Psychosis (HCC)   Depression   Anxiety   Delta-9-tetrahydrocannabinol (THC) dependence (HCC)   Tobacco use disorder   Insomnia   Medications: -Start Ingrezza 40 mg daily for TD -Start Vitamin D 50.000 units weekly for low D levels  -Give Amantadine x 2 more doses (3/26 & 3/27), then stop -Continue Risperdal 4 mg nightly for psychosis/mood stabilization -Continue Nicotine patch 21 mg daily for  nicotine addiction    PRNS -Start Trazodone 50 mg nightly PRN for sleep -Continue Tylenol 650 mg every 6 hours PRN for mild pain -Continue Maalox 30 mg every 4 hrs PRN for indigestion -Continue Milk of Magnesia as needed every 6 hrs for constipation   Reviewed: TSH, hemoglobin A1c, lipid profile,  B12, RPR, HIV WNL, Vitamin D low at 10.42, supplementing  with 50.000 units weekly.    Discharge Planning: Social work and case management to assist with discharge planning and identification of hospital follow-up needs prior to discharge Estimated LOS: 5-7 days Discharge Concerns: Need to establish a safety plan; Medication compliance and effectiveness Discharge Goals: Return home with outpatient referrals for mental health follow-up including medication management/psychotherapy   I certify that inpatient services furnished can reasonably be expected to improve the patient's condition.    Starleen Blue, NP 04/05/2023, 3:09 PM

## 2023-04-05 NOTE — Plan of Care (Signed)
   Problem: Education: Goal: Knowledge of [facility] Education information/materials will improve Outcome: Progressing Goal: Mental status will improve Outcome: Progressing Goal: Verbalization of understanding the information provided will improve Outcome: Progressing   Problem: Education: Goal: Emotional status will improve Outcome: Not Progressing   Problem: Activity: Goal: Interest or engagement in activities will improve Outcome: Not Progressing

## 2023-04-05 NOTE — Progress Notes (Signed)
   04/05/23 0900  Psych Admission Type (Psych Patients Only)  Admission Status Voluntary  Psychosocial Assessment  Patient Complaints Anxiety;Depression  Eye Contact Fair  Facial Expression Flat  Affect Appropriate to circumstance  Speech Logical/coherent  Interaction Assertive  Motor Activity Slow  Appearance/Hygiene In scrubs  Behavior Characteristics Cooperative;Appropriate to situation  Mood Anxious  Thought Process  Coherency WDL  Content WDL  Delusions None reported or observed  Perception Hallucinations  Hallucination Auditory;Visual (Pt stated, "I hear people I know and seeing alot of stuff".)  Judgment Impaired  Confusion None  Danger to Self  Current suicidal ideation? Denies  Self-Injurious Behavior No self-injurious ideation or behavior indicators observed or expressed   Agreement Not to Harm Self Yes  Description of Agreement Verbal  Danger to Others  Danger to Others None reported or observed

## 2023-04-05 NOTE — Progress Notes (Signed)
   04/05/23 2300  Psych Admission Type (Psych Patients Only)  Admission Status Voluntary  Psychosocial Assessment  Patient Complaints Anxiety;Depression  Eye Contact Fair  Facial Expression Flat  Affect Sad;Constricted;Flat  Speech Logical/coherent  Interaction Assertive  Motor Activity Slow  Appearance/Hygiene In scrubs  Behavior Characteristics Appropriate to situation  Mood Depressed;Anxious  Thought Process  Coherency Circumstantial  Content Delusions;Paranoia  Delusions Paranoid  Perception Hallucinations  Hallucination Auditory;Visual  Judgment Impaired  Confusion None  Danger to Self  Current suicidal ideation? Denies  Self-Injurious Behavior No self-injurious ideation or behavior indicators observed or expressed   Agreement Not to Harm Self Yes  Description of Agreement Verbal contract for safety  Danger to Others  Danger to Others None reported or observed

## 2023-04-05 NOTE — Plan of Care (Signed)
  Problem: Activity: Goal: Interest or engagement in activities will improve Outcome: Progressing Goal: Sleeping patterns will improve Outcome: Progressing   Problem: Physical Regulation: Goal: Ability to maintain clinical measurements within normal limits will improve Outcome: Progressing   Problem: Safety: Goal: Periods of time without injury will increase Outcome: Progressing

## 2023-04-05 NOTE — Group Note (Signed)
 Recreation Therapy Group Note   Group Topic:Animal Assisted Therapy   Group Date: 04/05/2023 Start Time: 0945 End Time: 1031 Facilitators: Iyad Deroo-McCall, LRT,CTRS Location: [address] Dayroom   Animal-Assisted Activity (AAA) Program Checklist/Progress Notes Patient Eligibility Criteria Checklist & Daily Group note for Rec Tx Intervention  AAA/T Program Assumption of Risk Form signed by Patient/ or Parent Legal Guardian Yes  Patient understands his/her participation is voluntary Yes  Education: Charity fundraiser, Appropriate Animal Interaction   Education Outcome: Acknowledges education.    Affect/Mood: N/A   Participation Level: Did not attend    Clinical Observations/Individualized Feedback:      Plan: Continue to engage patient in RT group sessions 2-3x/week.   Koray Soter-McCall, LRT,CTRS 04/05/2023 12:59 PM

## 2023-04-05 NOTE — [facility] Group Notes (Signed)
 [facility] Group Notes:  (Nursing/MHT/Case Management/Adjunct)  Date:  04/05/2023  Time:  9:33 PM  Type of Therapy:   Wrap-up group  Participation Level:  Did Not Attend  Participation Quality:    Affect:    Cognitive:    Insight:    Engagement in Group:    Modes of Intervention:    Summary of Progress/Problems: Pt didn't attend group. Writer provided pt with worksheet on breathing exercises.   Noah Delaine 04/05/2023, 9:33 PM

## 2023-04-05 NOTE — Group Note (Signed)
 Date:  04/05/2023 Time:  8:47 AM  Group Topic/Focus:  Goals Group:   The focus of this group is to help patients establish daily goals to achieve during treatment and discuss how the patient can incorporate goal setting into their daily lives to aide in recovery. Orientation:   The focus of this group is to educate the patient on the purpose and policies of crisis stabilization and provide a format to answer questions about their admission.  The group details unit policies and expectations of patients while admitted.    Participation Level:  Active  Participation Quality:  Appropriate  Affect:  Appropriate  Cognitive:  Appropriate  Insight: Appropriate  Engagement in Group:  Engaged  Modes of Intervention:  Discussion and Orientation  Additional Comments:  Goal is to have mental stability   Azalee Course 04/05/2023, 8:47 AM

## 2023-04-05 NOTE — Progress Notes (Signed)
   04/04/23 2100  Psych Admission Type (Psych Patients Only)  Admission Status Voluntary  Psychosocial Assessment  Patient Complaints Anxiety;Depression (anxiety 10/10, depression 5/10)  Eye Contact Fair  Facial Expression Flat  Affect Appropriate to circumstance  Speech Logical/coherent  Interaction Assertive  Motor Activity Slow  Appearance/Hygiene Disheveled  Behavior Characteristics Cooperative;Appropriate to situation  Mood Depressed  Thought Process  Coherency WDL  Content WDL  Delusions None reported or observed  Perception Hallucinations  Hallucination Auditory (Noted to be RTIS)  Judgment Impaired  Confusion None  Danger to Self  Current suicidal ideation? Denies  Self-Injurious Behavior No self-injurious ideation or behavior indicators observed or expressed   Agreement Not to Harm Self Yes  Description of Agreement Verbal  Danger to Others  Danger to Others None reported or observed

## 2023-04-05 NOTE — Group Note (Signed)
 LCSW Group Therapy Note   Group Date: 04/05/2023 Start Time: 1100 End Time: 1200   Participation:  did not attend  Type of Therapy:  Group Therapy  Title:  Speaking from the Heart: Communicating with Understanding and Empathy  Objective:  To help participants develop effective communication skills to express themselves clearly, listen actively, and navigate conflicts in a healthy way.  Goals: Increase awareness of verbal and non-verbal communication skills. Practice using "I" statements and active listening techniques. Learn coping strategies for managing communication stress.  Summary: Participants explored the importance of communication, discussed challenges, and practiced skills such as active listening and assertive expression. They reflected on past experiences and identified ways to improve communication in their daily lives.  Therapeutic Modalities: Cognitive-Behavioral Therapy (CBT): Restructuring negative thought patterns in communication. Mindfulness: Staying present and calm during conversations.   Alla Feeling, LCSWA 04/05/2023  12:10 PM

## 2023-04-06 DIAGNOSIS — F200 Paranoid schizophrenia: Secondary | ICD-10-CM | POA: Diagnosis not present

## 2023-04-06 NOTE — [facility] Group Notes (Signed)
 Spirituality Group Focus of discussion: Gratitude and Strength Awareness  Process: Following theoretical framework of group therapy of Chyrl Civatte and further informed by Rogerian and Relational Cultural Theory approaches, participants invited to name:  Sources of gratitude (internal>external) Articulate gratitude for self Name a personal strength/gift/skill Locate points of resonance among group members/engage the "here and now" Conclude with grounding/breathwork  Observations: Ms. Mcbain was present for the full group and was engaged in a passive way.  Antanette Richwine L. Sophronia Simas, M.Div [phone]

## 2023-04-06 NOTE — Group Note (Signed)
 Date:  04/06/2023 Time:  4:50 PM  Group Topic/Focus:  Dimensions of Wellness:   The focus of this group is to introduce the topic of wellness and discuss the role each dimension of wellness plays in total health.    Participation Level:  Active  Participation Quality:  Appropriate  Affect:  Appropriate  Cognitive:  Appropriate  Insight: Appropriate  Engagement in Group:  Engaged  Modes of Intervention:  Activity and Education  Additional Comments:   Pt attended the Wellness group and completed the Dimension of Wellness activity.  Heather Kramer 04/06/2023, 4:50 PM

## 2023-04-06 NOTE — Progress Notes (Signed)
   04/06/23 1000  Psych Admission Type (Psych Patients Only)  Admission Status Voluntary  Psychosocial Assessment  Patient Complaints Anxiety;Depression  Eye Contact Fair  Facial Expression Flat  Affect Flat  Speech Logical/coherent  Interaction Assertive  Motor Activity Slow  Appearance/Hygiene In scrubs  Behavior Characteristics Appropriate to situation  Mood Depressed;Anxious  Thought Process  Coherency Circumstantial  Content Paranoia  Delusions Paranoid  Perception Hallucinations  Hallucination Auditory;Visual  Judgment Impaired  Confusion None  Danger to Self  Current suicidal ideation? Denies  Agreement Not to Harm Self Yes  Description of Agreement verbal  Danger to Others  Danger to Others None reported or observed

## 2023-04-06 NOTE — Group Note (Signed)
 Date:  04/06/2023 Time:  4:30 PM  Group Topic/Focus:  Goals Group:   The focus of this group is to help patients establish daily goals to achieve during treatment and discuss how the patient can incorporate goal setting into their daily lives to aide in recovery. Orientation:   The focus of this group is to educate the patient on the purpose and policies of crisis stabilization and provide a format to answer questions about their admission.  The group details unit policies and expectations of patients while admitted.    Participation Level:  Did Not Attend  Participation Quality:   n/a  Affect:   n/a  Cognitive:   n/a  Insight: None  Engagement in Group:  n/a  Modes of Intervention:   n/a  Additional Comments:   Did not attend  Audon Heymann M Madoc Holquin 04/06/2023, 4:30 PM

## 2023-04-06 NOTE — Group Note (Signed)
 Recreation Therapy Group Note   Group Topic:Communication  Group Date: 04/06/2023 Start Time: 0935 End Time: 1012 Facilitators: Zaahir Pickney-McCall, LRT,CTRS Location: [address] Dayroom   Group Topic: Communication, Problem Solving   Goal Area(s) Addresses:  Patient will effectively listen to complete activity.  Patient will identify communication skills used to make activity successful.  Patient will identify how skills used during activity can be used to reach post d/c goals.    Intervention: Building surveyor Activity - Geometric pattern cards, pencils, blank paper    Activity: Geometric Drawings.  Three volunteers from the peer group will be shown an abstract picture with a particular arrangement of geometrical shapes.  Each round, one 'speaker' will describe the pattern, as accurately as possible without revealing the image to the group.  The remaining group members will listen and draw the picture to reflect how it is described to them. Patients with the role of 'listener' cannot ask clarifying questions but, may request that the speaker repeat a direction. Once the drawings are complete, the presenter will show the rest of the group the picture and compare how close each person came to drawing the picture. LRT will facilitate a post-activity discussion regarding effective communication and the importance of planning, listening, and asking for clarification in daily interactions with others.   Education: Environmental consultant, Active listening, Support systems, Discharge planning  Education Outcome: Acknowledges understanding/In group clarification offered/Needs additional education.     Affect/Mood: Flat   Participation Level: Moderate   Participation Quality: Independent   Behavior: Cooperative   Speech/Thought Process: Relevant   Insight: Limited   Judgement: Limited   Modes of Intervention: Activity   Patient Response to Interventions:  Attentive and  Receptive   Education Outcome:  In group clarification offered    Clinical Observations/Individualized Feedback: Pt was flat but attempted to complete activity. Pt started off well in the activity but eventually gave up and observed for the rest of group.    Plan: Continue to engage patient in RT group sessions 2-3x/week.   Tillman Kazmierski-McCall, LRT,CTRS 04/06/2023 11:41 AM

## 2023-04-06 NOTE — Plan of Care (Signed)
   Problem: Education: Goal: Knowledge of [facility] Education information/materials will improve Outcome: Progressing Goal: Emotional status will improve Outcome: Progressing Goal: Mental status will improve Outcome: Progressing Goal: Verbalization of understanding the information provided will improve Outcome: Progressing

## 2023-04-06 NOTE — Progress Notes (Signed)
 Pt transferred to [address] hall without incident.  Pt Awake and alert. Bright affect and pressured speech.  Currently maintaining appropriate boundaries. Pt requested hygiene supplies for a shower.   Q15 min checks in place and safety maintained.

## 2023-04-06 NOTE — [facility] Group Notes (Signed)
 [facility] Group Notes:  (Nursing/MHT/Case Management/Adjunct)  Date:  04/06/2023  Time:  8:26 PM  Type of Therapy:  Psychoeducational Skills  Participation Level:  Minimal  Participation Quality:  Attentive  Affect:  Flat  Cognitive:  Appropriate  Insight:  Improving  Engagement in Group:  Improving  Modes of Intervention:  Education  Summary of Progress/Problems: Patient rated her day as a 10 out of 10. She states that the voices "went away" today.   Hazle Coca S 04/06/2023, 8:26 PM

## 2023-04-06 NOTE — Progress Notes (Cosign Needed Addendum)
 [facility] MD Progress Note  04/06/2023 3:10 PM Heather Kramer  MRN:  [id number]  Principal Problem: Paranoid schizophrenia (HCC)  Diagnosis: Principal Problem:   Paranoid schizophrenia (HCC) Active Problems:   Psychosis (HCC)   Depression   Anxiety   Delta-9-tetrahydrocannabinol (THC) dependence (HCC)   Tobacco use disorder   Insomnia  Reason for admission: 28 y.o. female with a prior mental health diagnoses of schizophrenia who presented to the [facility] ED on 04/03/23 with complaints of worsening auditory hallucinations commanding in nature, telling her to end her life. As per ED documentation, pt was non-compliant with meds, but restarted them last week.  Patient was medically cleared prior to being transferred to this behavioral health Hospital voluntarily for mood stabilization. As per chart review, patient was last hospitalized at this hospital on 07/28/2019.   Daily notes: Heather Kramer is seen in her room. Chart reviewed. The chart findings discussed with the treatment team. She presents alert, oriented & aware of situation. She is visible on the unit, attending group sessions. She is making a good eye contact & verbally responsive. She reports, "I came to the hospital because I was hearing voices telling me to hurt myself. I don't know why the voices started, but it is getting a better. I started back on my medicines. No side effects. I'm attending group sessions, I just came out of the day room to take my shower. My anxiety right now is #2 & depression #1. I'm currently seeing people that are not there. Patient remains on Risperdal 4 mg, this is her home medication. Remains on amantadine for involuntary movements to her extremities, one dose remaining, then continue Ingrezza 40 mg daily for TD. There are no changes made on her current plan of care. Continue as already in progress. Vital signs remain stable.  Total Time spent with patient:  35 minutes  Past Psychiatric History: See H &P  Past  Medical History:  Past Medical History:  Diagnosis Date   Acquired acanthosis nigricans    Allergies    Asthma    Goiter    Goiter    Overweight(278.02)    Pre-diabetes    Schizophrenia (HCC)    Thyroiditis, autoimmune     Past Surgical History:  Procedure Laterality Date   MOUTH SURGERY     Family History:  Family History  Problem Relation Age of Onset   Obesity Mother    Heart disease Father    Cancer Maternal Grandmother    Diabetes Maternal Grandfather    Family Psychiatric  History: See H&P Social History:  Social History   Substance and Sexual Activity  Alcohol Use Yes   Comment: occasionally     Social History   Substance and Sexual Activity  Drug Use Not Currently   Types: Marijuana    Social History   Socioeconomic History   Marital status: Single    Spouse name: Not on file   Number of children: Not on file   Years of education: Not on file   Highest education level: 12th grade  Occupational History   Not on file  Tobacco Use   Smoking status: Every Day    Current packs/day: 2.00    Types: Cigarettes   Smokeless tobacco: Never  Vaping Use   Vaping status: Never Used  Substance and Sexual Activity   Alcohol use: Yes    Comment: occasionally   Drug use: Not Currently    Types: Marijuana   Sexual activity: Not Currently  Other Topics  Concern   Not on file  Social History Narrative   Lives with mother and brother. 9th grade at [employer]. Plays basketball.    Social Drivers of Corporate investment banker Strain: Not on File (10/14/2021)   Received from [facility]    Financial Resource Strain: 0  Food Insecurity: Not on File (10/07/2022)   Received from [facility] Insecurity    Food: 0  Transportation Needs: Patient Declined (04/03/2023)   PRAPARE - Administrator, Civil Service (Medical): Patient declined    Lack of Transportation (Non-Medical): Patient declined  Physical  Activity: Not on File (10/14/2021)   Received from [facility]   Physical Activity    Physical Activity: 0  Stress: Not on File (10/14/2021)   Received from [facility]   Stress    Stress: 0  Social Connections: Not on File (09/20/2022)   Received from [facility]   Social Connections    Connectedness: 0   Sleep: Poor  Appetite:  Good  Current Medications: Current Facility-Administered Medications  Medication Dose Route Frequency Provider Last Rate Last Admin   acetaminophen (TYLENOL) tablet 650 mg  650 mg Oral Q6H PRN Weber, Kyra A, NP   650 mg at 04/04/23 2056   albuterol (VENTOLIN HFA) 108 (90 Base) MCG/ACT inhaler 1-2 puff  1-2 puff Inhalation Q6H PRN Weber, Kyra A, NP       alum & mag hydroxide-simeth (MAALOX/MYLANTA) 200-200-20 MG/5ML suspension 30 mL  30 mL Oral Q4H PRN Weber, Kyra A, NP       amantadine (SYMMETREL) capsule 100 mg  100 mg Oral Daily Massengill, Harrold Donath, MD   100 mg at 04/06/23 1100   haloperidol (HALDOL) tablet 5 mg  5 mg Oral TID PRN Weber, Kyra A, NP       And   diphenhydrAMINE (BENADRYL) capsule 50 mg  50 mg Oral TID PRN Weber, Kyra A, NP       haloperidol lactate (HALDOL) injection 5 mg  5 mg Intramuscular TID PRN Weber, Kyra A, NP       And   diphenhydrAMINE (BENADRYL) injection 50 mg  50 mg Intramuscular TID PRN Weber, Bella Kennedy A, NP       And   LORazepam (ATIVAN) injection 2 mg  2 mg Intramuscular TID PRN Weber, Kyra A, NP       haloperidol lactate (HALDOL) injection 10 mg  10 mg Intramuscular TID PRN Weber, Kyra A, NP       And   diphenhydrAMINE (BENADRYL) injection 50 mg  50 mg Intramuscular TID PRN Weber, Kyra A, NP       And   LORazepam (ATIVAN) injection 2 mg  2 mg Intramuscular TID PRN Weber, Kyra A, NP       magnesium hydroxide (MILK OF MAGNESIA) suspension 30 mL  30 mL Oral Daily PRN Weber, Kyra A, NP       nicotine (NICODERM CQ - dosed in mg/24 hours) patch 21 mg  21 mg Transdermal Daily Bobbitt, Shalon E, NP   21 mg at 04/06/23 1100   pneumococcal 20-valent  conjugate vaccine (PREVNAR 20) injection 0.5 mL  0.5 mL Intramuscular Tomorrow-1000 Bobbitt, Shalon E, NP       risperiDONE (RISPERDAL M-TABS) disintegrating tablet 4 mg  4 mg Oral QHS Weber, Kyra A, NP   4 mg at 04/05/23 2126   traZODone (DESYREL) tablet 50 mg  50 mg Oral QHS PRN Starleen Blue, NP  50 mg at 04/05/23 2126   valbenazine (INGREZZA) capsule 40 mg  40 mg Oral Daily Starleen Blue, NP   40 mg at 04/06/23 1100   Vitamin D (Ergocalciferol) (DRISDOL) 1.25 MG (50000 UNIT) capsule 50,000 Units  50,000 Units Oral Q7 days Starleen Blue, NP   50,000 Units at 04/05/23 1351   Lab Results:  Results for orders placed or performed during the hospital encounter of 04/03/23 (from the past 48 hours)  GC/Chlamydia probe amp ([place])not at [facility]     Status: None   Collection Time: 04/04/23  4:19 PM  Result Value Ref Range   Neisseria Gonorrhea Negative    Chlamydia Negative    Comment Normal Reference Ranger Chlamydia - Negative    Comment      Normal Reference Range Neisseria Gonorrhea - Negative  RPR     Status: None   Collection Time: 04/04/23  6:34 PM  Result Value Ref Range   RPR Ser Ql NON REACTIVE NON REACTIVE    Comment: Performed at [facility], [address] [place], [place] [address]  HIV Antibody (routine testing w rflx)     Status: None   Collection Time: 04/04/23  6:34 PM  Result Value Ref Range   HIV Screen 4th Generation wRfx Non Reactive Non Reactive    Comment: Performed at [facility], [address] [place], [place] [address]  Hemoglobin A1c     Status: None   Collection Time: 04/05/23  6:48 AM  Result Value Ref Range   Hgb A1c MFr Bld 5.1 4.8 - 5.6 %    Comment: (NOTE) Pre diabetes:          5.7%-6.4%  Diabetes:              >6.4%  Glycemic control for   <7.0% adults with diabetes    Mean Plasma Glucose 99.67 mg/dL    Comment: Performed at [facility], [address] [place], [place] [address]  Lipid panel     Status: Abnormal    Collection Time: 04/05/23  6:48 AM  Result Value Ref Range   Cholesterol 163 0 - 200 mg/dL   Triglycerides 87 <914 mg/dL   HDL 38 (L) >78 mg/dL   Total CHOL/HDL Ratio 4.3 RATIO   VLDL 17 0 - 40 mg/dL   LDL Cholesterol 295 (H) 0 - 99 mg/dL    Comment:        Total Cholesterol/HDL:CHD Risk Coronary Heart Disease Risk Table                     Men   Women  1/2 Average Risk   3.4   3.3  Average Risk       5.0   4.4  2 X Average Risk   9.6   7.1  3 X Average Risk  23.4   11.0        Use the calculated Patient Ratio above and the CHD Risk Table to determine the patient's CHD Risk.        ATP III CLASSIFICATION (LDL):  <100     mg/dL   Optimal  621-308  mg/dL   Near or Above                    Optimal  130-159  mg/dL   Borderline  657-846  mg/dL   High  >962     mg/dL   Very High Performed at  [facility], [address], [place], [place] [address]   TSH     Status: None   Collection Time: 04/05/23  6:48 AM  Result Value Ref Range   TSH 2.311 0.350 - 4.500 uIU/mL    Comment: Performed by a 3rd Generation assay with a functional sensitivity of <=0.01 uIU/mL. Performed at [facility], [address], [place], [place] [address]   VITAMIN D 25 Hydroxy (Vit-D Deficiency, Fractures)     Status: Abnormal   Collection Time: 04/05/23  6:48 AM  Result Value Ref Range   Vit D, 25-Hydroxy 10.42 (L) 30 - 100 ng/mL    Comment: (NOTE) Vitamin D deficiency has been defined by the Institute of Medicine  and an Endocrine Society practice guideline as a level of serum 25-OH  vitamin D less than 20 ng/mL (1,2). The Endocrine Society went on to  further define vitamin D insufficiency as a level between 21 and 29  ng/mL (2).  1. IOM (Institute of Medicine). 2010. Dietary reference intakes for  calcium and D. [place]: [employer]. 2. Holick MF, Binkley [place], Bischoff-Ferrari HA, et al. Evaluation,  treatment, and prevention of vitamin D  deficiency: an Endocrine  Society clinical practice guideline, JCEM. 2011 Jul; 96(7): 1911-30.  Performed at [facility], [address], [place], [place] [address]   Vitamin B12     Status: None   Collection Time: 04/05/23  6:48 AM  Result Value Ref Range   Vitamin B-12 251 180 - 914 pg/mL    Comment: (NOTE) This assay is not validated for testing neonatal or myeloproliferative syndrome specimens for Vitamin B12 levels. Performed at [facility], [address], [place], [place] [address]    Blood Alcohol level:  Lab Results  Component Value Date   [facility] <10 04/03/2023   ETH <10 06/13/2020   Metabolic Disorder Labs: Lab Results  Component Value Date   HGBA1C 5.1 04/05/2023   MPG 99.67 04/05/2023   MPG 103 06/13/2020   Lab Results  Component Value Date   PROLACTIN 26.5 (H) 06/13/2020   PROLACTIN 16.6 07/28/2019   Lab Results  Component Value Date   CHOL 163 04/05/2023   TRIG 87 04/05/2023   HDL 38 (L) 04/05/2023   CHOLHDL 4.3 04/05/2023   VLDL 17 04/05/2023   LDLCALC 108 (H) 04/05/2023   LDLCALC 77 06/13/2020   Physical Findings: AIMS: 7 CIWA: n/a COWS:  n/a  Musculoskeletal: Strength & Muscle Tone: within normal limits Gait & Station: normal Patient leans: N/A  Psychiatric Specialty Exam:  Presentation  General Appearance:  Casual  Eye Contact: Fair  Speech: Normal Rate  Speech Volume: Decreased  Handedness: Right   Mood and Affect  Mood: Depressed; Anxious  Affect: Congruent   Thought Process  Thought Processes: Coherent  Descriptions of Associations:Intact  Orientation:Full (Time, Place and Person)  Thought Content:Illogical  History of Schizophrenia/Schizoaffective disorder:No data recorded Duration of Psychotic Symptoms:No data recorded Hallucinations:Hallucinations: Auditory; Command Description of Command Hallucinations: "shut up" Description of Auditory Hallucinations: multiple voices  inside her head  Ideas of Reference:Paranoia; Delusions; Percusatory  Suicidal Thoughts:Suicidal Thoughts: Yes, Passive SI Active Intent and/or Plan: Without Intent; Without Plan  Homicidal Thoughts:Homicidal Thoughts: No   Sensorium  Memory: Immediate Poor  Judgment: Poor  Insight: Fair   Chartered certified accountant: Fair  Attention Span: Fair  Recall: [employer] of Knowledge: Fair  Language: Fair   Psychomotor Activity  Psychomotor Activity:  Psychomotor Activity: Other (comment) (TD)  Assets  Assets: Resilience  Sleep  Sleep: Sleep: Good Number of Hours of Sleep: 7.5  Physical Exam: Physical Exam Constitutional:      Appearance: She is obese.  HENT:     Head: Normocephalic.  Cardiovascular:     Rate and Rhythm: Normal rate.     Pulses: Normal pulses.  Pulmonary:     Effort: Pulmonary effort is normal.  Genitourinary:    Comments: Deferred Musculoskeletal:        General: Normal range of motion.     Cervical back: Normal range of motion.  Skin:    General: Skin is warm and dry.  Neurological:     General: No focal deficit present.     Mental Status: She is alert and oriented to person, place, and time.    Review of Systems  Constitutional:  Negative for chills, diaphoresis and fever.  HENT:  Negative for congestion and sore throat.   Respiratory:  Negative for cough, shortness of breath and wheezing.   Cardiovascular:  Negative for chest pain and palpitations.  Gastrointestinal:  Negative for abdominal pain, constipation, diarrhea, heartburn, nausea and vomiting.  Genitourinary:  Negative for dysuria.  Musculoskeletal:  Negative for joint pain and myalgias.  Neurological:  Negative for dizziness, tingling, tremors, sensory change, speech change, focal weakness, seizures, loss of consciousness, weakness and headaches.  Endo/Heme/Allergies:        Allergies: Peanuts.  Psychiatric/Behavioral:  Positive for depression,  hallucinations (Reports visual hallucinations (seeing people).) and substance abuse (UDS (+) for THC). Negative for memory loss and suicidal ideas (Denies having a  plan/intent). The patient has insomnia. The patient is not nervous/anxious.    Blood pressure 120/86, pulse 87, temperature (!) 97.5 F (36.4 C), temperature source Oral, resp. rate 16, height 6\' 1"  (1.854 m), weight 107.5 kg, last menstrual period 02/28/2023, SpO2 100%. Body mass index is 31.27 kg/m.  Treatment Plan Summary: Daily contact with patient to assess and evaluate symptoms and progress in treatment and Medication management   Safety and Monitoring: Voluntary admission to inpatient psychiatric unit for safety, stabilization and treatment Daily contact with patient to assess and evaluate symptoms and progress in treatment Patient's case to be discussed in multi-disciplinary team meeting Observation Level : q15 minute checks Vital signs: q12 hours Precautions: Safety   Long Term Goal(s): Improvement in symptoms so as ready for discharge   Short Term Goals: Ability to identify changes in lifestyle to reduce recurrence of condition will improve, Ability to verbalize feelings will improve, Ability to disclose and discuss suicidal ideas, Ability to demonstrate self-control will improve, Ability to identify and develop effective coping behaviors will improve, Ability to maintain clinical measurements within normal limits will improve, Compliance with prescribed medications will improve, and Ability to identify triggers associated with substance abuse/mental health issues will improve   Diagnoses Principal Problem:   Paranoid schizophrenia (HCC) Active Problems:   Psychosis (HCC)   Depression   Anxiety   Delta-9-tetrahydrocannabinol (THC) dependence (HCC)   Tobacco use disorder   Insomnia   Medications: -Continue Ingrezza 40 mg daily for TD -Continue Vitamin D 50.000 units weekly for low D levels  -Give Amantadine x 2  more doses (3/26 & 3/27), then stop -Continue Risperdal 4 mg nightly for psychosis/mood stabilization -Continue Nicotine patch 21 mg daily for nicotine addiction    PRNS -Continue Trazodone 50 mg nightly PRN for sleep -Continue Tylenol 650 mg every 6 hours PRN for mild pain -Continue Maalox 30  mg every 4 hrs PRN for indigestion -Continue Milk of Magnesia as needed every 6 hrs for constipation   Reviewed: TSH, hemoglobin A1c, lipid profile,  B12, RPR, HIV WNL, Vitamin D low at 10.42, supplementing with 50.000 units weekly.    Discharge Planning: Social work and case management to assist with discharge planning and identification of hospital follow-up needs prior to discharge Estimated LOS: 5-7 days Discharge Concerns: Need to establish a safety plan; Medication compliance and effectiveness Discharge Goals: Return home with outpatient referrals for mental health follow-up including medication management/psychotherapy   I certify that inpatient services furnished can reasonably be expected to improve the patient's condition.    Armandina Stammer, NP, pmhnp, fnp-bc. 04/06/2023, 3:10 PM Patient ID: Heather Kramer, female   DOB: Mar 29, 1995, 28 y.o.   MRN: [id number]

## 2023-04-07 DIAGNOSIS — F200 Paranoid schizophrenia: Secondary | ICD-10-CM | POA: Diagnosis not present

## 2023-04-07 NOTE — Plan of Care (Signed)
   Problem: Education: Goal: Emotional status will improve Outcome: Not Progressing Goal: Mental status will improve Outcome: Not Progressing   Problem: Activity: Goal: Interest or engagement in activities will improve Outcome: Not Progressing

## 2023-04-07 NOTE — Progress Notes (Signed)
 Collateral contact - Harriett Laural Benes (mom) [phone]   Mom said that she spoke with patient this morning, and patient reported still hearing voices.  The voices tell her to kill herself.  She was admitted to the hospital because the family found a butcher knife in her car.  Mom said that patient stopped taking medications in September or October.  Patient was on [place] injection, side effects:  tremors and weight gain.  Then she started taking Risperidone, but she was experiencing mood swings and it hasn't been working well.  Mom said that injections would help with medication adherence.  Patient is currently homeless, and has been staying with mom, aunt, or in her car.   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  She said that patient would like to go to a group home, but her ACT Team hasn't been able to find one for her.    Favour Aleshire, LCSWA 04/07/2023

## 2023-04-07 NOTE — Group Note (Signed)
 Recreation Therapy Group Note   Group Topic:Self-Esteem  Group Date: 04/07/2023 Start Time: 1010 End Time: 1040 Facilitators: Saul Fabiano-McCall, LRT,CTRS Location: [address] Dayroom   Group Topic: Self-esteem  Goal Area(s) Addresses:  Patient will identify and write at least one positive trait about themself. Patient will acknowledge the benefit of healthy self-esteem. Patient will endorse understanding of ways to increase self-esteem.   Intervention: Personalized Plate- printed license plate template, markers or colored pencils   Activity: LRT began group session with open dialogue asking the patients to define self-esteem and verbally identify positive qualities and traits people may possess. Patients were then instructed to design a personalized license plate, with words and drawings, representing at least 3 positive things about themselves. Pts were encouraged to include favorites, things they are proud of, what they enjoy doing, and dreams for their future. If a patient had a life motto or a meaningful phase that expressed their life values, pt's were asked to incorporate that into their design as well. Patients were given the opportunity to share their completed work with the group.  Education: Healthy self-esteem, Positive character traits, Accepting compliments, Leisure as competence and coping, Support Systems, Discharge planning LRT educated patients on the importance of healthy self-esteem and ways to build self-esteem. LRT addressed discharge planning reviewing positive coping skills and healthy support systems.  Education Outcome: Acknowledges education/In group clarification offered   Affect/Mood: N/A   Participation Level: Did not attend    Clinical Observations/Individualized Feedback:      Plan: Continue to engage patient in RT group sessions 2-3x/week.   Heather Kramer, LRT,CTRS 04/07/2023 12:39 PM

## 2023-04-07 NOTE — Group Note (Signed)
 Date:  04/07/2023 Time:  9:11 PM  Group Topic/Focus:  Wrap-Up Group:   The focus of this group is to help patients review their daily goal of treatment and discuss progress on daily workbooks.    Participation Level:  Did Not Attend   Scot Dock 04/07/2023, 9:11 PM

## 2023-04-07 NOTE — Progress Notes (Signed)
 [facility] MD Progress Note  04/07/2023 3:29 PM KAELA BEITZ  MRN:  [id number]  Principal Problem: Paranoid schizophrenia (HCC)  Diagnosis: Principal Problem:   Paranoid schizophrenia (HCC) Active Problems:   Psychosis (HCC)   Depression   Anxiety   Delta-9-tetrahydrocannabinol (THC) dependence (HCC)   Tobacco use disorder   Insomnia  Reason for admission: 28 y.o. female with a prior mental health diagnoses of schizophrenia who presented to the [facility] ED on 04/03/23 with complaints of worsening auditory hallucinations commanding in nature, telling her to end her life. As per ED documentation, pt was non-compliant with meds, but restarted them last week.  Patient was medically cleared prior to being transferred to this behavioral health Hospital voluntarily for mood stabilization. As per chart review, patient was last hospitalized at this hospital on 07/28/2019.   Daily notes: [name] is seen in her room on [address]. Chart reviewed. The chart findings discussed with the treatment team. She was lying down in bed, but presents alert & oriented. She is making a good eye contact & verbally responsive. She reports, "My mood is okay. I'm not depressed or anxious. I'm not hearing any voices or seeing things today. I'm taking my medicines. [name] was transferred to the 500-hall yesterday. She was originally on [address]. Reports indicated that she was agitated/disruptive on the unit yesterday & was also responding to internal stimuli. However, today during this evaluation, patient presents civil. There are no behavioral issues, no disruptions or outbursts noted. Patient remains on Risperdal 4 mg, (her home medications). She completed the doses of her amantadine for involuntary movements to her extremities today. She continues on the Ingrezza 40 mg daily for TD. There are no other changes made on her current plan of care. Continue as already in progress. Vital signs remain stable.  Total Time spent with  patient:  35 minutes  Past Psychiatric History: See H &P  Past Medical History:  Past Medical History:  Diagnosis Date   Acquired acanthosis nigricans    Allergies    Asthma    Goiter    Goiter    Overweight(278.02)    Pre-diabetes    Schizophrenia (HCC)    Thyroiditis, autoimmune     Past Surgical History:  Procedure Laterality Date   MOUTH SURGERY     Family History:  Family History  Problem Relation Age of Onset   Obesity Mother    Heart disease Father    Cancer Maternal Grandmother    Diabetes Maternal Grandfather    Family Psychiatric  History: See H&P Social History:  Social History   Substance and Sexual Activity  Alcohol Use Yes   Comment: occasionally     Social History   Substance and Sexual Activity  Drug Use Not Currently   Types: Marijuana    Social History   Socioeconomic History   Marital status: Single    Spouse name: Not on file   Number of children: Not on file   Years of education: Not on file   Highest education level: 12th grade  Occupational History   Not on file  Tobacco Use   Smoking status: Every Day    Current packs/day: 2.00    Types: Cigarettes   Smokeless tobacco: Never  Vaping Use   Vaping status: Never Used  Substance and Sexual Activity   Alcohol use: Yes    Comment: occasionally   Drug use: Not Currently    Types: Marijuana   Sexual activity: Not Currently  Other Topics Concern  Not on file  Social History Narrative   Lives with mother and brother. 9th grade at [employer]. Plays basketball.    Social Drivers of Corporate investment banker Strain: Not on File (10/14/2021)   Received from [facility]    Financial Resource Strain: 0  Food Insecurity: Not on File (10/07/2022)   Received from [facility] Insecurity    Food: 0  Transportation Needs: Patient Declined (04/03/2023)   PRAPARE - Administrator, Civil Service (Medical): Patient declined    Lack  of Transportation (Non-Medical): Patient declined  Physical Activity: Not on File (10/14/2021)   Received from [facility]   Physical Activity    Physical Activity: 0  Stress: Not on File (10/14/2021)   Received from [facility]   Stress    Stress: 0  Social Connections: Not on File (09/20/2022)   Received from [facility]   Social Connections    Connectedness: 0   Sleep: Poor  Appetite:  Good  Current Medications: Current Facility-Administered Medications  Medication Dose Route Frequency Provider Last Rate Last Admin   acetaminophen (TYLENOL) tablet 650 mg  650 mg Oral Q6H PRN Weber, Kyra A, NP   650 mg at 04/04/23 2056   albuterol (VENTOLIN HFA) 108 (90 Base) MCG/ACT inhaler 1-2 puff  1-2 puff Inhalation Q6H PRN Weber, Kyra A, NP       alum & mag hydroxide-simeth (MAALOX/MYLANTA) 200-200-20 MG/5ML suspension 30 mL  30 mL Oral Q4H PRN Weber, Kyra A, NP       haloperidol (HALDOL) tablet 5 mg  5 mg Oral TID PRN Weber, Kyra A, NP       And   diphenhydrAMINE (BENADRYL) capsule 50 mg  50 mg Oral TID PRN Weber, Kyra A, NP       haloperidol lactate (HALDOL) injection 5 mg  5 mg Intramuscular TID PRN Weber, Kyra A, NP       And   diphenhydrAMINE (BENADRYL) injection 50 mg  50 mg Intramuscular TID PRN Weber, Bella Kennedy A, NP       And   LORazepam (ATIVAN) injection 2 mg  2 mg Intramuscular TID PRN Weber, Kyra A, NP       haloperidol lactate (HALDOL) injection 10 mg  10 mg Intramuscular TID PRN Weber, Kyra A, NP       And   diphenhydrAMINE (BENADRYL) injection 50 mg  50 mg Intramuscular TID PRN Weber, Kyra A, NP       And   LORazepam (ATIVAN) injection 2 mg  2 mg Intramuscular TID PRN Weber, Kyra A, NP       magnesium hydroxide (MILK OF MAGNESIA) suspension 30 mL  30 mL Oral Daily PRN Weber, Kyra A, NP       nicotine (NICODERM CQ - dosed in mg/24 hours) patch 21 mg  21 mg Transdermal Daily Bobbitt, Shalon E, NP   21 mg at 04/07/23 0806   pneumococcal 20-valent conjugate vaccine (PREVNAR 20) injection 0.5 mL  0.5  mL Intramuscular Tomorrow-1000 Bobbitt, Shalon E, NP       risperiDONE (RISPERDAL M-TABS) disintegrating tablet 4 mg  4 mg Oral QHS Weber, Kyra A, NP   4 mg at 04/06/23 2021   traZODone (DESYREL) tablet 50 mg  50 mg Oral QHS PRN Starleen Blue, NP   50 mg at 04/06/23 2022   valbenazine (INGREZZA) capsule 40 mg  40 mg Oral Daily Starleen Blue, NP   40 mg  at 04/07/23 0805   Vitamin D (Ergocalciferol) (DRISDOL) 1.25 MG (50000 UNIT) capsule 50,000 Units  50,000 Units Oral Q7 days Starleen Blue, NP   50,000 Units at 04/05/23 1351   Lab Results:  No results found for this or any previous visit (from the past 48 hours).  Blood Alcohol level:  Lab Results  Component Value Date   ETH <10 04/03/2023   ETH <10 06/13/2020   Metabolic Disorder Labs: Lab Results  Component Value Date   HGBA1C 5.1 04/05/2023   MPG 99.67 04/05/2023   MPG 103 06/13/2020   Lab Results  Component Value Date   PROLACTIN 26.5 (H) 06/13/2020   PROLACTIN 16.6 07/28/2019   Lab Results  Component Value Date   CHOL 163 04/05/2023   TRIG 87 04/05/2023   HDL 38 (L) 04/05/2023   CHOLHDL 4.3 04/05/2023   VLDL 17 04/05/2023   LDLCALC 108 (H) 04/05/2023   LDLCALC 77 06/13/2020   Physical Findings: AIMS: 7 CIWA: n/a COWS:  n/a  Musculoskeletal: Strength & Muscle Tone: within normal limits Gait & Station: normal Patient leans: N/A  Psychiatric Specialty Exam:  Presentation  General Appearance:  Casual; Fairly Groomed  Eye Contact: Good  Speech: Clear and Coherent; Normal Rate  Speech Volume: Normal  Handedness: Right   Mood and Affect  Mood: -- (presents quiet)  Affect: Congruent   Thought Process  Thought Processes: Coherent  Descriptions of Associations:Intact  Orientation:Full (Time, Place and Person)  Thought Content:Logical  History of Schizophrenia/Schizoaffective disorder:No data recorded Duration of Psychotic Symptoms:No data recorded Hallucinations:Hallucinations:  None Description of Command Hallucinations: Denies Description of Auditory Hallucinations: Denies   Ideas of Reference:None  Suicidal Thoughts:Suicidal Thoughts: No   Homicidal Thoughts:Homicidal Thoughts: No    Sensorium  Memory: Immediate Fair; Recent Fair; Remote Fair  Judgment: -- (Probably limited)  Insight: Fair   Art therapist  Concentration: Good  Attention Span: Good  Recall: Fair  Fund of Knowledge: -- (Probably limited.)  Language: Good   Psychomotor Activity  Psychomotor Activity: Psychomotor Activity: Normal   Assets  Assets: Communication Skills; Financial Resources/Insurance; Resilience; Social Support  Sleep  Sleep: Sleep: Good Number of Hours of Sleep: 9   Physical Exam: Physical Exam Constitutional:      Appearance: She is obese.  HENT:     Head: Normocephalic.  Cardiovascular:     Rate and Rhythm: Normal rate.     Pulses: Normal pulses.  Pulmonary:     Effort: Pulmonary effort is normal.  Genitourinary:    Comments: Deferred Musculoskeletal:        General: Normal range of motion.     Cervical back: Normal range of motion.  Skin:    General: Skin is warm and dry.  Neurological:     General: No focal deficit present.     Mental Status: She is alert and oriented to person, place, and time.   Review of Systems  Constitutional:  Negative for chills, diaphoresis and fever.  HENT:  Negative for congestion and sore throat.   Respiratory:  Negative for cough, shortness of breath and wheezing.   Cardiovascular:  Negative for chest pain and palpitations.  Gastrointestinal:  Negative for abdominal pain, constipation, diarrhea, heartburn, nausea and vomiting.  Genitourinary:  Negative for dysuria.  Musculoskeletal:  Negative for joint pain and myalgias.  Neurological:  Negative for dizziness, tingling, tremors, sensory change, speech change, focal weakness, seizures, loss of consciousness, weakness and headaches.   Endo/Heme/Allergies:        Allergies:  Peanuts.  Psychiatric/Behavioral:  Positive for depression, hallucinations (Reports visual hallucinations (seeing people).) and substance abuse (UDS (+) for THC). Negative for memory loss and suicidal ideas (Denies having a  plan/intent). The patient has insomnia. The patient is not nervous/anxious.    Blood pressure 123/77, pulse 77, temperature (!) 97.5 F (36.4 C), temperature source Oral, resp. rate 16, height 6\' 1"  (1.854 m), weight 107.5 kg, last menstrual period 02/28/2023, SpO2 100%. Body mass index is 31.27 kg/m.  Treatment Plan Summary: Daily contact with patient to assess and evaluate symptoms and progress in treatment and Medication management   Safety and Monitoring: Voluntary admission to inpatient psychiatric unit for safety, stabilization and treatment Daily contact with patient to assess and evaluate symptoms and progress in treatment Patient's case to be discussed in multi-disciplinary team meeting Observation Level : q15 minute checks Vital signs: q12 hours Precautions: Safety   Long Term Goal(s): Improvement in symptoms so as ready for discharge   Short Term Goals: Ability to identify changes in lifestyle to reduce recurrence of condition will improve, Ability to verbalize feelings will improve, Ability to disclose and discuss suicidal ideas, Ability to demonstrate self-control will improve, Ability to identify and develop effective coping behaviors will improve, Ability to maintain clinical measurements within normal limits will improve, Compliance with prescribed medications will improve, and Ability to identify triggers associated with substance abuse/mental health issues will improve   Diagnoses Principal Problem:   Paranoid schizophrenia (HCC) Active Problems:   Psychosis (HCC)   Depression   Anxiety   Delta-9-tetrahydrocannabinol (THC) dependence (HCC)   Tobacco use disorder   Insomnia   Medications: -Continue  Ingrezza 40 mg daily for TD -Continue Vitamin D 50.000 units weekly for low D levels  -Give Amantadine x 2 more doses (3/26 & 3/27), then stop -Continue Risperdal 4 mg nightly for psychosis/mood stabilization -Continue Nicotine patch 21 mg daily for nicotine addiction    PRNS -Continue Trazodone 50 mg nightly PRN for sleep -Continue Tylenol 650 mg every 6 hours PRN for mild pain -Continue Maalox 30 mg every 4 hrs PRN for indigestion -Continue Milk of Magnesia as needed every 6 hrs for constipation   Reviewed: TSH, hemoglobin A1c, lipid profile,  B12, RPR, HIV WNL, Vitamin D low at 10.42, supplementing with 50.000 units weekly.    Discharge Planning: Social work and case management to assist with discharge planning and identification of hospital follow-up needs prior to discharge Estimated LOS: 5-7 days Discharge Concerns: Need to establish a safety plan; Medication compliance and effectiveness Discharge Goals: Return home with outpatient referrals for mental health follow-up including medication management/psychotherapy   I certify that inpatient services furnished can reasonably be expected to improve the patient's condition.    Armandina Stammer, NP, pmhnp, fnp-bc. 04/07/2023, 3:29 PM Patient ID: Michaelle Birks, female   DOB: March 16, 1995, 28 y.o.   MRN: [id number] Patient ID: Michaelle Birks, female   DOB: Mar 05, 1995, 28 y.o.   MRN: [id number]

## 2023-04-07 NOTE — [facility] Group Notes (Signed)
 Adult Psychoeducational Group Note  Date:  04/07/2023 Time:  9:57 AM  Group Topic/Focus:  Goals Group:   The focus of this group is to help patients establish daily goals to achieve during treatment and discuss how the patient can incorporate goal setting into their daily lives to aide in recovery. Orientation:   The focus of this group is to educate the patient on the purpose and policies of crisis stabilization and provide a format to answer questions about their admission.  The group details unit policies and expectations of patients while admitted.  Participation Level:  Did Not Attend  Participation Quality:    Affect:    Cognitive:    Insight:   Engagement in Group:    Modes of Intervention:    Additional Comments:    [name] 04/07/2023, 9:57 AM

## 2023-04-07 NOTE — Progress Notes (Signed)
   04/07/23 1000  Psych Admission Type (Psych Patients Only)  Admission Status Voluntary  Psychosocial Assessment  Patient Complaints Depression  Eye Contact Fair  Facial Expression Flat  Affect Flat  Speech Logical/coherent  Interaction Minimal  Motor Activity Slow  Appearance/Hygiene In scrubs  Behavior Characteristics Appropriate to situation  Mood Depressed  Thought Process  Coherency Circumstantial  Content Paranoia  Delusions Paranoid  Perception Hallucinations  Hallucination Auditory  Judgment Limited  Confusion None  Danger to Self  Current suicidal ideation? Denies  Danger to Others  Danger to Others None reported or observed   Dar Note: Patient presents with a flat affect and depressed mood.  Patient observed pacing the hallway talking to herself, appears to be arguing with herself at times.  PRN Haldol 5 mg and Benadryl 50 mg given at 1716 due to paranoia.  Safety checks maintained.

## 2023-04-08 ENCOUNTER — Encounter (HOSPITAL_COMMUNITY): Payer: Self-pay

## 2023-04-08 DIAGNOSIS — F200 Paranoid schizophrenia: Secondary | ICD-10-CM | POA: Diagnosis not present

## 2023-04-08 MED ORDER — RISPERIDONE 2 MG PO TBDP
2.0000 mg | ORAL_TABLET | Freq: Two times a day (BID) | ORAL | Status: DC
  Administered 2023-04-08 – 2023-04-10 (×4): 2 mg via ORAL
  Filled 2023-04-08 (×8): qty 1

## 2023-04-08 NOTE — Progress Notes (Signed)
   04/08/23 0853  Charting Type  Charting Type Shift assessment  Safety Check Verification  Has the RN verified the 15 minute safety check completion? Yes  Neurological  Neuro (WDL) WDL  HEENT  HEENT (WDL) WDL  Respiratory  Respiratory (WDL) WDL  Cardiac  Cardiac (WDL) WDL  Vascular  Vascular (WDL) WDL  Integumentary  Integumentary (WDL) X (no new findings)  Braden Scale (Ages 8 and up)  Sensory Perceptions 4  Moisture 4  Activity 4  Mobility 4  Nutrition 3  Friction and Shear 3  Braden Scale Score 22  Musculoskeletal  Musculoskeletal (WDL) WDL  Gastrointestinal  Gastrointestinal (WDL) WDL  GU Assessment  Genitourinary (WDL) WDL  Neurological  Level of Consciousness Alert

## 2023-04-08 NOTE — [facility] Group Notes (Signed)
 Spirituality Group Focus of discussion: Gratitude and Strength Awareness  Process: Following theoretical framework of group therapy of Chyrl Civatte and further informed by Rogerian and Relational Cultural Theory approaches, participants invited to name:  Sources of gratitude (internal>external) Articulate gratitude for self Name a personal strength/gift/skill Locate points of resonance among group members/engage the "here and now" Conclude with grounding/breathwork  Observations: Shaterrica was present for half of the group. She was somewhat reserved but did participate in the group discussion.  Christ Fullenwider L. Sophronia Simas, M.Div [phone]

## 2023-04-08 NOTE — Progress Notes (Signed)
   04/08/23 0853  Psych Admission Type (Psych Patients Only)  Admission Status Voluntary  Psychosocial Assessment  Patient Complaints Depression  Eye Contact Fair  Facial Expression Flat  Affect Flat  Speech Logical/coherent  Interaction Minimal  Motor Activity Slow  Appearance/Hygiene In scrubs  Behavior Characteristics Appropriate to situation  Mood Depressed  Thought Process  Coherency Circumstantial  Content Paranoia  Delusions Paranoid  Perception WDL  Hallucination None reported or observed  Judgment Limited  Confusion None  Danger to Self  Current suicidal ideation? Denies  Agreement Not to Harm Self Yes  Description of Agreement verbal  Danger to Others  Danger to Others None reported or observed

## 2023-04-08 NOTE — Group Note (Signed)
 Date:  04/08/2023 Time:  8:39 PM  Group Topic/Focus:  Wrap-Up Group:   The focus of this group is to help patients review their daily goal of treatment and discuss progress on daily workbooks.    Participation Level:  Active  Participation Quality:  Appropriate  Affect:  Appropriate  Cognitive:  Appropriate  Insight: Appropriate  Engagement in Group:  Engaged  Modes of Intervention:  Education and Exploration  Additional Comments:  Patient attended and participated in group tonight.  She reports that she thinks that she is cute.  Lita Mains [facility] 04/08/2023, 8:39 PM

## 2023-04-08 NOTE — [facility] Group Notes (Deleted)
 Adult Psychoeducational Group Note  Date:  04/08/2023 Time:  9:44 PM  Group Topic/Focus:  Wrap-Up Group:   The focus of this group is to help patients review their daily goal of treatment and discuss progress on daily workbooks.  Participation Level:  Did Not Attend  Christ Kick 04/08/2023, 9:44 PM

## 2023-04-08 NOTE — Progress Notes (Signed)
 [facility] MD Progress Note  04/08/2023 2:57 PM Heather Kramer  MRN:  [id number]  Principal Problem: Paranoid schizophrenia (HCC)  Diagnosis: Principal Problem:   Paranoid schizophrenia (HCC) Active Problems:   Psychosis (HCC)   Depression   Anxiety   Delta-9-tetrahydrocannabinol (THC) dependence (HCC)   Tobacco use disorder   Insomnia  Reason for admission: 28 y.o. female with a prior mental health diagnoses of schizophrenia who presented to the [facility] ED on 04/03/23 with complaints of worsening auditory hallucinations commanding in nature, telling her to end her life. As per ED documentation, pt was non-compliant with meds, but restarted them last week.  Patient was medically cleared prior to being transferred to this behavioral health Hospital voluntarily for mood stabilization. As per chart review, patient was last hospitalized at this hospital on 07/28/2019.   Daily notes: [name] is seen in her room. She was sitting up on her bed. Chart reviewed. The chart findings discussed with the treatment team. She was in the day room with the other patients & a [facility] staff. She presents with a restricted affect, good eye contact & verbally responsive. She speaks in a monotonic voice. She reports, "I'm doing good. I got no complaints today. I slept well last night". There are no behavioral issues, disruptions or outbursts noted during this evaluation. However, staff reports that patient talks to herself & responds to internal stimuli, usually towards the evening time. Patient's Risperdal 4 mg q hs was changed to Risperdal 2 mg po bid for mood control. She continues on the Ingrezza 40 mg daily for TD. There are no other changes made on her current plan of care. Continue as already in progress. Vital signs remain stable.  Total Time spent with patient:  35 minutes  Past Psychiatric History: See H &P  Past Medical History:  Past Medical History:  Diagnosis Date   Acquired acanthosis nigricans     Allergies    Asthma    Goiter    Goiter    Overweight(278.02)    Pre-diabetes    Schizophrenia (HCC)    Thyroiditis, autoimmune     Past Surgical History:  Procedure Laterality Date   MOUTH SURGERY     Family History:  Family History  Problem Relation Age of Onset   Obesity Mother    Heart disease Father    Cancer Maternal Grandmother    Diabetes Maternal Grandfather    Family Psychiatric  History: See H&P Social History:  Social History   Substance and Sexual Activity  Alcohol Use Yes   Comment: occasionally     Social History   Substance and Sexual Activity  Drug Use Not Currently   Types: Marijuana    Social History   Socioeconomic History   Marital status: Single    Spouse name: Not on file   Number of children: Not on file   Years of education: Not on file   Highest education level: 12th grade  Occupational History   Not on file  Tobacco Use   Smoking status: Every Day    Current packs/day: 2.00    Types: Cigarettes   Smokeless tobacco: Never  Vaping Use   Vaping status: Never Used  Substance and Sexual Activity   Alcohol use: Yes    Comment: occasionally   Drug use: Not Currently    Types: Marijuana   Sexual activity: Not Currently  Other Topics Concern   Not on file  Social History Narrative   Lives with mother and brother. 9th  grade at [employer]. Plays basketball.    Social Drivers of Corporate investment banker Strain: Not on File (10/14/2021)   Received from [employer]    Financial Resource Strain: 0  Food Insecurity: Not on File (10/07/2022)   Received from [facility] Insecurity    Food: 0  Transportation Needs: Patient Declined (04/03/2023)   PRAPARE - Administrator, Civil Service (Medical): Patient declined    Lack of Transportation (Non-Medical): Patient declined  Physical Activity: Not on File (10/14/2021)   Received from [facility]   Physical Activity    Physical Activity:  0  Stress: Not on File (10/14/2021)   Received from [facility]   Stress    Stress: 0  Social Connections: Not on File (09/20/2022)   Received from [facility]   Social Connections    Connectedness: 0   Sleep: Poor  Appetite:  Good  Current Medications: Current Facility-Administered Medications  Medication Dose Route Frequency Provider Last Rate Last Admin   acetaminophen (TYLENOL) tablet 650 mg  650 mg Oral Q6H PRN Weber, Kyra A, NP   650 mg at 04/04/23 2056   albuterol (VENTOLIN HFA) 108 (90 Base) MCG/ACT inhaler 1-2 puff  1-2 puff Inhalation Q6H PRN Weber, Kyra A, NP       alum & mag hydroxide-simeth (MAALOX/MYLANTA) 200-200-20 MG/5ML suspension 30 mL  30 mL Oral Q4H PRN Weber, Kyra A, NP       haloperidol (HALDOL) tablet 5 mg  5 mg Oral TID PRN Phebe Colla A, NP   5 mg at 04/07/23 1716   And   diphenhydrAMINE (BENADRYL) capsule 50 mg  50 mg Oral TID PRN Phebe Colla A, NP   50 mg at 04/07/23 1716   haloperidol lactate (HALDOL) injection 5 mg  5 mg Intramuscular TID PRN Weber, Bella Kennedy A, NP       And   diphenhydrAMINE (BENADRYL) injection 50 mg  50 mg Intramuscular TID PRN Weber, Bella Kennedy A, NP       And   LORazepam (ATIVAN) injection 2 mg  2 mg Intramuscular TID PRN Weber, Kyra A, NP       haloperidol lactate (HALDOL) injection 10 mg  10 mg Intramuscular TID PRN Weber, Kyra A, NP       And   diphenhydrAMINE (BENADRYL) injection 50 mg  50 mg Intramuscular TID PRN Weber, Kyra A, NP       And   LORazepam (ATIVAN) injection 2 mg  2 mg Intramuscular TID PRN Weber, Kyra A, NP       magnesium hydroxide (MILK OF MAGNESIA) suspension 30 mL  30 mL Oral Daily PRN Weber, Kyra A, NP       nicotine (NICODERM CQ - dosed in mg/24 hours) patch 21 mg  21 mg Transdermal Daily Bobbitt, Shalon E, NP   21 mg at 04/08/23 1610   pneumococcal 20-valent conjugate vaccine (PREVNAR 20) injection 0.5 mL  0.5 mL Intramuscular Tomorrow-1000 Bobbitt, Shalon E, NP       risperiDONE (RISPERDAL M-TABS) disintegrating tablet 2 mg  2  mg Oral Q12H Massengill, Nathan, MD       traZODone (DESYREL) tablet 50 mg  50 mg Oral QHS PRN Starleen Blue, NP   50 mg at 04/07/23 2055   valbenazine (INGREZZA) capsule 40 mg  40 mg Oral Daily Starleen Blue, NP   40 mg at 04/08/23 0853   Vitamin D (Ergocalciferol) (DRISDOL) 1.25 MG (50000 UNIT)  capsule 50,000 Units  50,000 Units Oral Q7 days Starleen Blue, NP   50,000 Units at 04/05/23 1351   Lab Results:  No results found for this or any previous visit (from the past 48 hours).  Blood Alcohol level:  Lab Results  Component Value Date   ETH <10 04/03/2023   ETH <10 06/13/2020   Metabolic Disorder Labs: Lab Results  Component Value Date   HGBA1C 5.1 04/05/2023   MPG 99.67 04/05/2023   MPG 103 06/13/2020   Lab Results  Component Value Date   PROLACTIN 26.5 (H) 06/13/2020   PROLACTIN 16.6 07/28/2019   Lab Results  Component Value Date   CHOL 163 04/05/2023   TRIG 87 04/05/2023   HDL 38 (L) 04/05/2023   CHOLHDL 4.3 04/05/2023   VLDL 17 04/05/2023   LDLCALC 108 (H) 04/05/2023   LDLCALC 77 06/13/2020   Physical Findings: AIMS: 7 CIWA: n/a COWS:  n/a  Musculoskeletal: Strength & Muscle Tone: within normal limits Gait & Station: normal Patient leans: N/A  Psychiatric Specialty Exam:  Presentation  General Appearance:  Casual; Fairly Groomed  Eye Contact: Good  Speech: Clear and Coherent; Normal Rate  Speech Volume: Normal  Handedness: Right   Mood and Affect  Mood: -- (presents quiet)  Affect: Congruent   Thought Process  Thought Processes: Coherent  Descriptions of Associations:Intact  Orientation:Full (Time, Place and Person)  Thought Content:Logical  History of Schizophrenia/Schizoaffective disorder:No data recorded Duration of Psychotic Symptoms:No data recorded Hallucinations:Hallucinations: None Description of Command Hallucinations: Denies Description of Auditory Hallucinations: Denies   Ideas of Reference:None  Suicidal  Thoughts:Suicidal Thoughts: No   Homicidal Thoughts:Homicidal Thoughts: No    Sensorium  Memory: Immediate Fair; Recent Fair; Remote Fair  Judgment: -- (Probably limited)  Insight: Fair   Art therapist  Concentration: Good  Attention Span: Good  Recall: Fair  Fund of Knowledge: -- (Probably limited.)  Language: Good   Psychomotor Activity  Psychomotor Activity: Psychomotor Activity: Normal  Assets  Assets: Communication Skills; Financial Resources/Insurance; Resilience; Social Support  Sleep  Sleep: Sleep: Good Number of Hours of Sleep: 9  Physical Exam: Physical Exam Constitutional:      Appearance: She is obese.  HENT:     Head: Normocephalic.  Cardiovascular:     Rate and Rhythm: Normal rate.     Pulses: Normal pulses.  Pulmonary:     Effort: Pulmonary effort is normal.  Genitourinary:    Comments: Deferred Musculoskeletal:        General: Normal range of motion.     Cervical back: Normal range of motion.  Skin:    General: Skin is warm and dry.  Neurological:     General: No focal deficit present.     Mental Status: She is alert and oriented to person, place, and time.    Review of Systems  Constitutional:  Negative for chills, diaphoresis and fever.  HENT:  Negative for congestion and sore throat.   Respiratory:  Negative for cough, shortness of breath and wheezing.   Cardiovascular:  Negative for chest pain and palpitations.  Gastrointestinal:  Negative for abdominal pain, constipation, diarrhea, heartburn, nausea and vomiting.  Genitourinary:  Negative for dysuria.  Musculoskeletal:  Negative for joint pain and myalgias.  Neurological:  Negative for dizziness, tingling, tremors, sensory change, speech change, focal weakness, seizures, loss of consciousness, weakness and headaches.  Endo/Heme/Allergies:        Allergies: Peanuts.  Psychiatric/Behavioral:  Positive for depression, hallucinations (Reports visual  hallucinations (seeing people).) and  substance abuse (UDS (+) for THC). Negative for memory loss and suicidal ideas (Denies having a  plan/intent). The patient has insomnia. The patient is not nervous/anxious.    Blood pressure 119/75, pulse 83, temperature 98.1 F (36.7 C), temperature source Oral, resp. rate 16, height 6\' 1"  (1.854 m), weight 107.5 kg, last menstrual period 02/28/2023, SpO2 100%. Body mass index is 31.27 kg/m.  Treatment Plan Summary: Daily contact with patient to assess and evaluate symptoms and progress in treatment and Medication management   Safety and Monitoring: Voluntary admission to inpatient psychiatric unit for safety, stabilization and treatment Daily contact with patient to assess and evaluate symptoms and progress in treatment Patient's case to be discussed in multi-disciplinary team meeting Observation Level : q15 minute checks Vital signs: q12 hours Precautions: Safety   Long Term Goal(s): Improvement in symptoms so as ready for discharge   Short Term Goals: Ability to identify changes in lifestyle to reduce recurrence of condition will improve, Ability to verbalize feelings will improve, Ability to disclose and discuss suicidal ideas, Ability to demonstrate self-control will improve, Ability to identify and develop effective coping behaviors will improve, Ability to maintain clinical measurements within normal limits will improve, Compliance with prescribed medications will improve, and Ability to identify triggers associated with substance abuse/mental health issues will improve   Diagnoses Principal Problem:   Paranoid schizophrenia (HCC) Active Problems:   Psychosis (HCC)   Depression   Anxiety   Delta-9-tetrahydrocannabinol (THC) dependence (HCC)   Tobacco use disorder   Insomnia   Medications: -Continue Ingrezza 40 mg daily for TD -Continue Vitamin D 50.000 units weekly for low D levels  -Completed Amantadine x 2 more doses (3/26 & 3/27),  then stop -Changed Risperdal to 2 mg bid for psychosis/mood stabilization -Continue Nicotine patch 21 mg daily for nicotine addiction    PRNS -Continue Trazodone 50 mg nightly PRN for sleep -Continue Tylenol 650 mg every 6 hours PRN for mild pain -Continue Maalox 30 mg every 4 hrs PRN for indigestion -Continue Milk of Magnesia as needed every 6 hrs for constipation   Reviewed: TSH, hemoglobin A1c, lipid profile,  B12, RPR, HIV WNL, Vitamin D low at 10.42, supplementing with 50.000 units weekly.    Discharge Planning: Social work and case management to assist with discharge planning and identification of hospital follow-up needs prior to discharge Estimated LOS: 5-7 days Discharge Concerns: Need to establish a safety plan; Medication compliance and effectiveness Discharge Goals: Return home with outpatient referrals for mental health follow-up including medication management/psychotherapy   I certify that inpatient services furnished can reasonably be expected to improve the patient's condition.    Armandina Stammer, NP, pmhnp, fnp-bc. 04/08/2023, 2:57 PM Patient ID: Heather Kramer, female   DOB: 11/30/1995, 28 y.o.   MRN: [id number] Patient ID: Heather Kramer, female   DOB: 1995-02-13, 28 y.o.   MRN: [id number] Patient ID: Heather Kramer, female   DOB: 1995-09-04, 28 y.o.   MRN: [id number]

## 2023-04-08 NOTE — BH IP Treatment Plan (Signed)
 Interdisciplinary Treatment and Diagnostic Plan Update  04/08/2023 Time of Session: 11:55 AM - UPDATE Heather Kramer MRN: [id number]  Principal Diagnosis: Paranoid schizophrenia (HCC)  Secondary Diagnoses: Principal Problem:   Paranoid schizophrenia (HCC) Active Problems:   Psychosis (HCC)   Depression   Anxiety   Delta-9-tetrahydrocannabinol (THC) dependence (HCC)   Tobacco use disorder   Insomnia   Current Medications:  Current Facility-Administered Medications  Medication Dose Route Frequency Provider Last Rate Last Admin   acetaminophen (TYLENOL) tablet 650 mg  650 mg Oral Q6H PRN Weber, Kyra A, NP   650 mg at 04/04/23 2056   albuterol (VENTOLIN HFA) 108 (90 Base) MCG/ACT inhaler 1-2 puff  1-2 puff Inhalation Q6H PRN Weber, Kyra A, NP       alum & mag hydroxide-simeth (MAALOX/MYLANTA) 200-200-20 MG/5ML suspension 30 mL  30 mL Oral Q4H PRN Weber, Kyra A, NP       haloperidol (HALDOL) tablet 5 mg  5 mg Oral TID PRN Phebe Colla A, NP   5 mg at 04/07/23 1716   And   diphenhydrAMINE (BENADRYL) capsule 50 mg  50 mg Oral TID PRN Phebe Colla A, NP   50 mg at 04/07/23 1716   haloperidol lactate (HALDOL) injection 5 mg  5 mg Intramuscular TID PRN Weber, Kyra A, NP       And   diphenhydrAMINE (BENADRYL) injection 50 mg  50 mg Intramuscular TID PRN Weber, Bella Kennedy A, NP       And   LORazepam (ATIVAN) injection 2 mg  2 mg Intramuscular TID PRN Weber, Kyra A, NP       haloperidol lactate (HALDOL) injection 10 mg  10 mg Intramuscular TID PRN Weber, Kyra A, NP       And   diphenhydrAMINE (BENADRYL) injection 50 mg  50 mg Intramuscular TID PRN Weber, Kyra A, NP       And   LORazepam (ATIVAN) injection 2 mg  2 mg Intramuscular TID PRN Weber, Kyra A, NP       magnesium hydroxide (MILK OF MAGNESIA) suspension 30 mL  30 mL Oral Daily PRN Weber, Kyra A, NP       nicotine (NICODERM CQ - dosed in mg/24 hours) patch 21 mg  21 mg Transdermal Daily Bobbitt, Shalon E, NP   21 mg at 04/08/23 4098    pneumococcal 20-valent conjugate vaccine (PREVNAR 20) injection 0.5 mL  0.5 mL Intramuscular Tomorrow-1000 Bobbitt, Shalon E, NP       risperiDONE (RISPERDAL M-TABS) disintegrating tablet 2 mg  2 mg Oral Q12H Massengill, Nathan, MD       traZODone (DESYREL) tablet 50 mg  50 mg Oral QHS PRN Starleen Blue, NP   50 mg at 04/07/23 2055   valbenazine (INGREZZA) capsule 40 mg  40 mg Oral Daily Starleen Blue, NP   40 mg at 04/08/23 1191   Vitamin D (Ergocalciferol) (DRISDOL) 1.25 MG (50000 UNIT) capsule 50,000 Units  50,000 Units Oral Q7 days Starleen Blue, NP   50,000 Units at 04/05/23 1351   PTA Medications: Medications Prior to Admission  Medication Sig Dispense Refill Last Dose/Taking   albuterol (VENTOLIN HFA) 108 (90 Base) MCG/ACT inhaler Inhale 1-2 puffs into the lungs every 6 (six) hours as needed for wheezing or shortness of breath. 1 each 0    amantadine (SYMMETREL) 100 MG capsule Take 100 mg by mouth 2 (two) times daily.      hydrOXYzine (ATARAX/VISTARIL) 25 MG tablet Take 25 mg by mouth 2 (two)  times daily as needed for anxiety. (Patient not taking: Reported on 04/03/2023)      risperidone (RISPERDAL) 4 MG tablet Take 4 mg by mouth at bedtime.      RISPERIDONE ER [place] Inject into the skin every 30 (thirty) days. (Patient not taking: Reported on 04/03/2023)       Patient Stressors: Financial difficulties   Marital or family conflict   Medication change or noncompliance    Patient Strengths: Average or above average intelligence  Communication skills  Physical Health   Treatment Modalities: Medication Management, Group therapy, Case management,  1 to 1 session with clinician, Psychoeducation, Recreational therapy.   Physician Treatment Plan for Primary Diagnosis: Paranoid schizophrenia (HCC) Long Term Goal(s): Improvement in symptoms so as ready for discharge   Short Term Goals: Ability to identify changes in lifestyle to reduce recurrence of condition will improve Ability to  verbalize feelings will improve Ability to disclose and discuss suicidal ideas Ability to demonstrate self-control will improve Ability to identify and develop effective coping behaviors will improve Ability to maintain clinical measurements within normal limits will improve Compliance with prescribed medications will improve Ability to identify triggers associated with substance abuse/mental health issues will improve  Medication Management: Evaluate patient's response, side effects, and tolerance of medication regimen.  Therapeutic Interventions: 1 to 1 sessions, Unit Group sessions and Medication administration.  Evaluation of Outcomes: Progressing  Physician Treatment Plan for Secondary Diagnosis: Principal Problem:   Paranoid schizophrenia (HCC) Active Problems:   Psychosis (HCC)   Depression   Anxiety   Delta-9-tetrahydrocannabinol (THC) dependence (HCC)   Tobacco use disorder   Insomnia  Long Term Goal(s): Improvement in symptoms so as ready for discharge   Short Term Goals: Ability to identify changes in lifestyle to reduce recurrence of condition will improve Ability to verbalize feelings will improve Ability to disclose and discuss suicidal ideas Ability to demonstrate self-control will improve Ability to identify and develop effective coping behaviors will improve Ability to maintain clinical measurements within normal limits will improve Compliance with prescribed medications will improve Ability to identify triggers associated with substance abuse/mental health issues will improve     Medication Management: Evaluate patient's response, side effects, and tolerance of medication regimen.  Therapeutic Interventions: 1 to 1 sessions, Unit Group sessions and Medication administration.  Evaluation of Outcomes: Progressing   RN Treatment Plan for Primary Diagnosis: Paranoid schizophrenia (HCC) Long Term Goal(s): Knowledge of disease and therapeutic regimen to maintain  health will improve  Short Term Goals: Ability to remain free from injury will improve, Ability to verbalize frustration and anger appropriately will improve, Ability to participate in decision making will improve, Ability to verbalize feelings will improve, Ability to identify and develop effective coping behaviors will improve, and Compliance with prescribed medications will improve  Medication Management: RN will administer medications as ordered by provider, will assess and evaluate patient's response and provide education to patient for prescribed medication. RN will report any adverse and/or side effects to prescribing provider.  Therapeutic Interventions: 1 on 1 counseling sessions, Psychoeducation, Medication administration, Evaluate responses to treatment, Monitor vital signs and CBGs as ordered, Perform/monitor CIWA, COWS, AIMS and Fall Risk screenings as ordered, Perform wound care treatments as ordered.  Evaluation of Outcomes: Progressing   LCSW Treatment Plan for Primary Diagnosis: Paranoid schizophrenia (HCC) Long Term Goal(s): Safe transition to appropriate next level of care at discharge, Engage patient in therapeutic group addressing interpersonal concerns.  Short Term Goals: Engage patient in aftercare planning with referrals and  resources, Increase ability to appropriately verbalize feelings, Facilitate acceptance of mental health diagnosis and concerns, and Identify triggers associated with mental health/substance abuse issues  Therapeutic Interventions: Assess for all discharge needs, 1 to 1 time with Social worker, Explore available resources and support systems, Assess for adequacy in community support network, Educate family and significant other(s) on suicide prevention, Complete Psychosocial Assessment, Interpersonal group therapy.  Evaluation of Outcomes: Progressing   Progress in Treatment: Attending groups: Yes. Participating in groups: Yes. Taking medication as  prescribed: Yes. Toleration medication: Yes. Family/Significant other contact made: Yes, contacted Harriett Laural Benes (mom) [phone]. Patient understands diagnosis: Yes. Discussing patient identified problems/goals with staff: Yes. Medical problems stabilized or resolved: Yes. Denies suicidal/homicidal ideation: Yes. Issues/concerns per patient self-inventory: No.   New problem(s) identified: No, Describe:  none   New Short Term/Long Term Goal(s): medication stabilization, elimination of SI thoughts, development of comprehensive mental wellness plan.      Patient Goals:  "Work on my mental stability"   Discharge Plan or Barriers: Patient recently admitted. CSW will continue to follow and assess for appropriate referrals and possible discharge planning.      Reason for Continuation of Hospitalization: Anxiety Hallucinations Medication stabilization   Estimated Length of Stay: 4 - 6 days  Last 3 [place] Suicide Severity Risk Score: Flowsheet Row [facility]Current[facility] Most recent reading at 04/03/2023 11:27 PM ED from 04/03/2023 in [facility] Emergency Department [facility] Most recent reading at 04/03/2023  7:48 AM ED from 10/19/2020 in [facility] at [facility] Most recent reading at 10/19/2020  1:00 PM  C-SSRS RISK CATEGORY Low Risk Low Risk No Risk       Last PHQ 2/9 Scores:     No data to display          Scribe for Treatment Team: Nyra Jabs 04/08/2023 6:52 PM

## 2023-04-08 NOTE — Progress Notes (Signed)
   04/08/23 2100  Psych Admission Type (Psych Patients Only)  Admission Status Voluntary  Psychosocial Assessment  Patient Complaints None  Eye Contact Fair  Facial Expression Flat  Affect Appropriate to circumstance  Speech Logical/coherent  Interaction Minimal  Motor Activity Slow  Appearance/Hygiene Improved  Behavior Characteristics Appropriate to situation  Mood Depressed  Thought Process  Coherency Circumstantial  Content Paranoia  Delusions Paranoid  Perception WDL  Hallucination None reported or observed  Judgment Limited  Confusion None  Danger to Self  Current suicidal ideation? Denies  Agreement Not to Harm Self Yes  Danger to Others  Danger to Others None reported or observed

## 2023-04-09 DIAGNOSIS — F200 Paranoid schizophrenia: Secondary | ICD-10-CM | POA: Diagnosis not present

## 2023-04-09 NOTE — Plan of Care (Signed)
  Problem: Activity: Goal: Interest or engagement in activities will improve Outcome: Progressing   Problem: Safety: Goal: Periods of time without injury will increase Outcome: Progressing   Problem: Medication: Goal: Compliance with prescribed medication regimen will improve Outcome: Progressing

## 2023-04-09 NOTE — [facility] Group Notes (Signed)
 Adult Psychoeducational Group Note  Date:  04/09/2023 Time:  6:52 PM  Group Topic/Focus:  Goals Group:   The focus of this group is to help patients establish daily goals to achieve during treatment and discuss how the patient can incorporate goal setting into their daily lives to aide in recovery. Orientation:   The focus of this group is to educate the patient on the purpose and policies of crisis stabilization and provide a format to answer questions about their admission.  The group details unit policies and expectations of patients while admitted.  Participation Level:  Did Not Attend  Participation Quality:    Affect:    Cognitive:    Insight:   Engagement in Group:    Modes of Intervention:    Additional Comments:    [name] 04/09/2023, 6:52 PM

## 2023-04-09 NOTE — Progress Notes (Signed)
 [facility] MD Progress Note  04/09/2023 12:40 PM Heather Kramer  MRN:  [id number]  Principal Problem: Paranoid schizophrenia (HCC)  Diagnosis: Principal Problem:   Paranoid schizophrenia (HCC) Active Problems:   Psychosis (HCC)   Depression   Anxiety   Delta-9-tetrahydrocannabinol (THC) dependence (HCC)   Tobacco use disorder   Insomnia  Reason for admission: 28 y.o. female with a prior mental health diagnoses of schizophrenia who presented to the [facility] ED on 04/03/23 with complaints of worsening auditory hallucinations commanding in nature, telling her to end her life. As per ED documentation, pt was non-compliant with meds, but restarted them last week.  Patient was medically cleared prior to being transferred to this behavioral health Hospital voluntarily for mood stabilization. As per chart review, patient was last hospitalized at this hospital on 07/28/2019.   On my exam today, patient is laying in bed.  She reports no problems with her medications.  Denies AH and VH.  Reports not having auditory hallucinations for a few days, although she was responding to internal stimuli yesterday per nursing.  Patient is of poor or unreliable historian.  Reports sleep is okay.  Reports mood is tired.  Denies feeling depressed or sad.  She is accepting her medications on the unit.  She declined an LAI.  We adjusted Risperdal from 4 mg nightly to 2 mg twice daily starting last night, due to the worsening symptoms and the hours preceding her evening Risperdal dose. Otherwise she denies feeling paranoid.  Denies SI and HI.  Dyskinesia has significantly decreased since starting Ingrezza.  Total Time spent with patient:  15 minutes  Past Psychiatric History: See H &P  Past Medical History:  Past Medical History:  Diagnosis Date   Acquired acanthosis nigricans    Allergies    Asthma    Goiter    Goiter    Overweight(278.02)    Pre-diabetes    Schizophrenia (HCC)    Thyroiditis, autoimmune      Past Surgical History:  Procedure Laterality Date   MOUTH SURGERY     Family History:  Family History  Problem Relation Age of Onset   Obesity Mother    Heart disease Father    Cancer Maternal Grandmother    Diabetes Maternal Grandfather    Family Psychiatric  History: See H&P Social History:  Social History   Substance and Sexual Activity  Alcohol Use Yes   Comment: occasionally     Social History   Substance and Sexual Activity  Drug Use Not Currently   Types: Marijuana    Social History   Socioeconomic History   Marital status: Single    Spouse name: Not on file   Number of children: Not on file   Years of education: Not on file   Highest education level: 12th grade  Occupational History   Not on file  Tobacco Use   Smoking status: Every Day    Current packs/day: 2.00    Types: Cigarettes   Smokeless tobacco: Never  Vaping Use   Vaping status: Never Used  Substance and Sexual Activity   Alcohol use: Yes    Comment: occasionally   Drug use: Not Currently    Types: Marijuana   Sexual activity: Not Currently  Other Topics Concern   Not on file  Social History Narrative   Lives with mother and brother. 9th grade at [employer]. Plays basketball.    Social Drivers of Corporate investment banker Strain: Not on  File (10/14/2021)   Received from [facility]    Financial Resource Strain: 0  Food Insecurity: Not on File (10/07/2022)   Received from [facility] Insecurity    Food: 0  Transportation Needs: Patient Declined (04/03/2023)   PRAPARE - Administrator, Civil Service (Medical): Patient declined    Lack of Transportation (Non-Medical): Patient declined  Physical Activity: Not on File (10/14/2021)   Received from [facility]   Physical Activity    Physical Activity: 0  Stress: Not on File (10/14/2021)   Received from [facility]   Stress    Stress: 0  Social Connections: Not on File (09/20/2022)   Received  from [facility]   Social Connections    Connectedness: 0   Sleep: Poor  Appetite:  Good  Current Medications: Current Facility-Administered Medications  Medication Dose Route Frequency Provider Last Rate Last Admin   acetaminophen (TYLENOL) tablet 650 mg  650 mg Oral Q6H PRN Weber, Kyra A, NP   650 mg at 04/04/23 2056   albuterol (VENTOLIN HFA) 108 (90 Base) MCG/ACT inhaler 1-2 puff  1-2 puff Inhalation Q6H PRN Weber, Kyra A, NP       alum & mag hydroxide-simeth (MAALOX/MYLANTA) 200-200-20 MG/5ML suspension 30 mL  30 mL Oral Q4H PRN Weber, Kyra A, NP       haloperidol (HALDOL) tablet 5 mg  5 mg Oral TID PRN Phebe Colla A, NP   5 mg at 04/07/23 1716   And   diphenhydrAMINE (BENADRYL) capsule 50 mg  50 mg Oral TID PRN Phebe Colla A, NP   50 mg at 04/07/23 1716   haloperidol lactate (HALDOL) injection 5 mg  5 mg Intramuscular TID PRN Weber, Bella Kennedy A, NP       And   diphenhydrAMINE (BENADRYL) injection 50 mg  50 mg Intramuscular TID PRN Weber, Bella Kennedy A, NP       And   LORazepam (ATIVAN) injection 2 mg  2 mg Intramuscular TID PRN Weber, Kyra A, NP       haloperidol lactate (HALDOL) injection 10 mg  10 mg Intramuscular TID PRN Weber, Kyra A, NP       And   diphenhydrAMINE (BENADRYL) injection 50 mg  50 mg Intramuscular TID PRN Weber, Kyra A, NP       And   LORazepam (ATIVAN) injection 2 mg  2 mg Intramuscular TID PRN Weber, Kyra A, NP       magnesium hydroxide (MILK OF MAGNESIA) suspension 30 mL  30 mL Oral Daily PRN Weber, Kyra A, NP       nicotine (NICODERM CQ - dosed in mg/24 hours) patch 21 mg  21 mg Transdermal Daily Bobbitt, Shalon E, NP   21 mg at 04/09/23 0911   pneumococcal 20-valent conjugate vaccine (PREVNAR 20) injection 0.5 mL  0.5 mL Intramuscular Tomorrow-1000 Bobbitt, Shalon E, NP       risperiDONE (RISPERDAL M-TABS) disintegrating tablet 2 mg  2 mg Oral Q12H Dimitris Shanahan, MD   2 mg at 04/09/23 0910   traZODone (DESYREL) tablet 50 mg  50 mg Oral QHS PRN Starleen Blue, NP   50  mg at 04/08/23 2058   valbenazine (INGREZZA) capsule 40 mg  40 mg Oral Daily Starleen Blue, NP   40 mg at 04/09/23 0910   Vitamin D (Ergocalciferol) (DRISDOL) 1.25 MG (50000 UNIT) capsule 50,000 Units  50,000 Units Oral Q7 days Starleen Blue, NP   50,000 Units at 04/05/23 1351  Lab Results:  No results found for this or any previous visit (from the past 48 hours).  Blood Alcohol level:  Lab Results  Component Value Date   ETH <10 04/03/2023   ETH <10 06/13/2020   Metabolic Disorder Labs: Lab Results  Component Value Date   HGBA1C 5.1 04/05/2023   MPG 99.67 04/05/2023   MPG 103 06/13/2020   Lab Results  Component Value Date   PROLACTIN 26.5 (H) 06/13/2020   PROLACTIN 16.6 07/28/2019   Lab Results  Component Value Date   CHOL 163 04/05/2023   TRIG 87 04/05/2023   HDL 38 (L) 04/05/2023   CHOLHDL 4.3 04/05/2023   VLDL 17 04/05/2023   LDLCALC 108 (H) 04/05/2023   LDLCALC 77 06/13/2020   Physical Findings: AIMS: 7 CIWA: n/a COWS:  n/a  Musculoskeletal: Strength & Muscle Tone: within normal limits Gait & Station: normal Patient leans: N/A  Psychiatric Specialty Exam:  Presentation  General Appearance:  Casual; Disheveled  Eye Contact: Poor  Speech: Slow  Speech Volume: Decreased  Handedness: Right   Mood and Affect  Mood: Anxious; Dysphoric  Affect: Constricted   Thought Process  Thought Processes: Linear  Descriptions of Associations:Intact  Orientation:Full (Time, Place and Person)  Thought Content:Logical  History of Schizophrenia/Schizoaffective disorder:No data recorded Duration of Psychotic Symptoms:No data recorded Hallucinations:Hallucinations: None    Ideas of Reference:None  Suicidal Thoughts:Suicidal Thoughts: No    Homicidal Thoughts:Homicidal Thoughts: No     Sensorium  Memory: Immediate Fair; Recent Fair; Remote Good  Judgment: Fair  Insight: Fair   Art therapist   Concentration: Poor  Attention Span: Poor  Recall: [employer] of Knowledge: -- (Probably limited.)  Language: Good   Psychomotor Activity  Psychomotor Activity: Psychomotor Activity: Psychomotor Retardation   Assets  Assets: Communication Skills; Financial Resources/Insurance; Resilience; Social Support  Sleep  Sleep: Sleep: Fair   Physical Exam: Physical Exam Constitutional:      Appearance: She is obese.  HENT:     Head: Normocephalic.  Cardiovascular:     Rate and Rhythm: Normal rate.     Pulses: Normal pulses.  Pulmonary:     Effort: Pulmonary effort is normal.  Genitourinary:    Comments: Deferred Musculoskeletal:        General: Normal range of motion.     Cervical back: Normal range of motion.  Skin:    General: Skin is warm and dry.  Neurological:     General: No focal deficit present.     Mental Status: She is alert and oriented to person, place, and time.  Psychiatric:        Behavior: Behavior normal.    Review of Systems  Constitutional:  Negative for chills, diaphoresis and fever.  HENT:  Negative for congestion and sore throat.   Respiratory:  Negative for cough, shortness of breath and wheezing.   Cardiovascular:  Negative for chest pain and palpitations.  Gastrointestinal:  Negative for abdominal pain, constipation, diarrhea, heartburn, nausea and vomiting.  Genitourinary:  Negative for dysuria.  Musculoskeletal:  Negative for joint pain and myalgias.  Neurological:  Negative for dizziness, tingling, tremors, sensory change, speech change, focal weakness, seizures, loss of consciousness, weakness and headaches.  Endo/Heme/Allergies:        Allergies: Peanuts.  Psychiatric/Behavioral:  Positive for substance abuse (UDS (+) for THC). Negative for depression, hallucinations (Reports visual hallucinations (seeing people).), memory loss and suicidal ideas (Denies having a  plan/intent). The patient is not nervous/anxious and does not have  insomnia.  Blood pressure 118/79, pulse 82, temperature 98.2 F (36.8 C), temperature source Oral, resp. rate 16, height 6\' 1"  (1.854 m), weight 107.5 kg, last menstrual period 02/28/2023, SpO2 100%. Body mass index is 31.27 kg/m.  Treatment Plan Summary: Daily contact with patient to assess and evaluate symptoms and progress in treatment and Medication management    Diagnoses Principal Problem:   Paranoid schizophrenia (HCC) Active Problems:   Psychosis (HCC)   Depression   Anxiety   Delta-9-tetrahydrocannabinol (THC) dependence (HCC)   Tobacco use disorder   Insomnia  Plan: Safety and Monitoring: Voluntary admission to inpatient psychiatric unit for safety, stabilization and treatment Daily contact with patient to assess and evaluate symptoms and progress in treatment Patient's case to be discussed in multi-disciplinary team meeting Observation Level : q15 minute checks Vital signs: q12 hours Precautions: Safety gers associated with substance abuse/mental health issues will improve     Medication plan: -Continue Ingrezza 40 mg daily for TD -Continue Vitamin D 50.000 units weekly for low D levels  -Completed Amantadine taper  -Continue Risperdal 2 mg bid (was changed from 4 mg nightly due to worsening of symptoms and the hours preceding her evening dose of Risperdal ) - for psychosis/mood stabilization  -Continue Nicotine patch 21 mg daily for nicotine addiction    PRNS -Continue Trazodone 50 mg nightly PRN for sleep -Continue Tylenol 650 mg every 6 hours PRN for mild pain -Continue Maalox 30 mg every 4 hrs PRN for indigestion -Continue Milk of Magnesia as needed every 6 hrs for constipation   Reviewed: TSH, hemoglobin A1c, lipid profile,  B12, RPR, HIV WNL, Vitamin D low at 10.42, supplementing with 50.000 units weekly.    Discharge Planning: Social work and case management to assist with discharge planning and identification of hospital follow-up needs prior to  discharge Estimated LOS: 5-7 days Discharge Concerns: Need to establish a safety plan; Medication compliance and effectiveness Discharge Goals: Return home with outpatient referrals for mental health follow-up including medication management/psychotherapy   I certify that inpatient services furnished can reasonably be expected to improve the patient's condition.    Phineas Inches, MD 04/09/2023, 12:40 PM  Total Time Spent in Direct Patient Care:  I personally spent 25 minutes on the unit in direct patient care. The direct patient care time included face-to-face time with the patient, reviewing the patient's chart, communicating with other professionals, and coordinating care. Greater than 50% of this time was spent in counseling or coordinating care with the patient regarding goals of hospitalization, psycho-education, and discharge planning needs.   Phineas Inches, MD Psychiatrist

## 2023-04-09 NOTE — Group Note (Signed)
 Date:  04/09/2023 Time:  8:47 PM  Group Topic/Focus:  Wrap-Up Group:   The focus of this group is to help patients review their daily goal of treatment and discuss progress on daily workbooks.    Participation Level:  Did Not Attend   Scot Dock 04/09/2023, 8:47 PM

## 2023-04-09 NOTE — Progress Notes (Signed)
   04/09/23 0912  Psych Admission Type (Psych Patients Only)  Admission Status Voluntary  Psychosocial Assessment  Patient Complaints None  Eye Contact Fair  Facial Expression Flat  Affect Flat  Speech Logical/coherent  Interaction Minimal  Motor Activity Slow  Appearance/Hygiene Improved  Behavior Characteristics Appropriate to situation  Mood Pleasant  Thought Process  Coherency WDL  Content WDL  Delusions None reported or observed  Perception WDL  Hallucination None reported or observed  Judgment Limited  Confusion None  Danger to Self  Current suicidal ideation? Denies  Agreement Not to Harm Self Yes  Description of Agreement verbal  Danger to Others  Danger to Others None reported or observed

## 2023-04-10 DIAGNOSIS — F200 Paranoid schizophrenia: Secondary | ICD-10-CM | POA: Diagnosis not present

## 2023-04-10 MED ORDER — RISPERIDONE 3 MG PO TBDP
3.0000 mg | ORAL_TABLET | Freq: Every day | ORAL | Status: DC
  Administered 2023-04-11: 3 mg via ORAL
  Filled 2023-04-10 (×3): qty 1

## 2023-04-10 MED ORDER — RISPERIDONE 2 MG PO TBDP
2.0000 mg | ORAL_TABLET | Freq: Every day | ORAL | Status: AC
  Administered 2023-04-10: 2 mg via ORAL
  Filled 2023-04-10: qty 1

## 2023-04-10 MED ORDER — RISPERIDONE 3 MG PO TBDP: 3.0000 mg | ORAL_TABLET | Freq: Every day | ORAL | Status: DC

## 2023-04-10 MED ORDER — RISPERIDONE 1 MG PO TBDP
1.0000 mg | ORAL_TABLET | Freq: Every day | ORAL | Status: DC
  Administered 2023-04-11 – 2023-04-12 (×2): 1 mg via ORAL
  Filled 2023-04-10 (×4): qty 1

## 2023-04-10 NOTE — Plan of Care (Signed)
   Problem: Education: Goal: Emotional status will improve Outcome: Progressing Goal: Mental status will improve Outcome: Progressing   Problem: Activity: Goal: Interest or engagement in activities will improve Outcome: Progressing Goal: Sleeping patterns will improve Outcome: Progressing

## 2023-04-10 NOTE — [facility] Suicide Risk Assessment (Signed)
 [facility] INPATIENT:  Family/Significant Other Suicide Prevention Education  Suicide Prevention Education:  Education Completed; mom, Heather Kramer [phone].  I spoke with mom,  (name of family member/significant other) has been identified by the patient as the family member/significant other with whom the patient will be residing, and identified as the person(s) who will aid the patient in the event of a mental health crisis (suicidal ideations/suicide attempt).  With written consent from the patient, the family member/significant other has been provided the following suicide prevention education, prior to the and/or following the discharge of the patient.  The suicide prevention education provided includes the following: Suicide risk factors Suicide prevention and interventions National Suicide Hotline telephone number [facility] assessment telephone number [facility] Emergency Assistance 911 [facility] and/or Residential Mobile Crisis Unit telephone number  Request made of family/significant other to: Remove weapons (e.g., guns, rifles, knives), all items previously/currently identified as safety concern.   Remove drugs/medications (over-the-counter, prescriptions, illicit drugs), all items previously/currently identified as a safety concern.  The family member/significant other verbalizes understanding of the suicide prevention education information provided.  The family member/significant other agrees to remove the items of safety concern listed above.  Steffanie Dunn LCSWA 04/10/2023, 3:45 PM

## 2023-04-10 NOTE — Plan of Care (Signed)
   Problem: Education: Goal: Emotional status will improve Outcome: Progressing Goal: Mental status will improve Outcome: Progressing   Problem: Activity: Goal: Sleeping patterns will improve Outcome: Progressing   Problem: Coping: Goal: Ability to verbalize frustrations and anger appropriately will improve Outcome: Progressing   Problem: Safety: Goal: Periods of time without injury will increase Outcome: Progressing

## 2023-04-10 NOTE — Progress Notes (Signed)
   04/10/23 0601  15 Minute Checks  Location Bedroom  Visual Appearance Calm  Behavior Sleeping  Sleep (Behavioral Health Patients Only)  Calculate sleep? (Click Yes once per 24 hr at 0600 safety check) Yes  Documented sleep last 24 hours 9.25

## 2023-04-10 NOTE — Progress Notes (Signed)
   04/10/23 2015  Psych Admission Type (Psych Patients Only)  Admission Status Voluntary  Psychosocial Assessment  Patient Complaints Depression  Eye Contact Fair  Facial Expression Flat  Affect Sad  Speech Logical/coherent  Interaction Assertive  Motor Activity Slow  Appearance/Hygiene Unremarkable  Behavior Characteristics Cooperative  Mood Depressed;Suspicious  Aggressive Behavior  Effect No apparent injury  Thought Process  Coherency WDL  Content WDL  Delusions WDL  Perception WDL  Hallucination None reported or observed  Judgment Impaired  Confusion WDL  Danger to Self  Current suicidal ideation? Denies  Danger to Others  Danger to Others None reported or observed

## 2023-04-10 NOTE — Progress Notes (Signed)
[facility] MD Progress Note  04/10/2023 2:37 PM Heather Kramer  MRN:  [id number]  Principal Problem: Paranoid schizophrenia (HCC)  Diagnosis: Principal Problem:   Paranoid schizophrenia (HCC) Active Problems:   Psychosis (HCC)   Depression   Anxiety   Delta-9-tetrahydrocannabinol (THC) dependence (HCC)   Tobacco use disorder   Insomnia  Reason for admission: 28 y.o. female with a prior mental health diagnoses of schizophrenia who presented to the [facility] ED on 04/03/23 with complaints of worsening auditory hallucinations commanding in nature, telling her to end her life. As per ED documentation, pt was non-compliant with meds, but restarted them last week.  Patient was medically cleared prior to being transferred to this behavioral health Hospital voluntarily for mood stabilization. As per chart review, patient was last hospitalized at this hospital on 07/28/2019.   Today's patient assessment note: Prior to encounter, chart was reviewed, case was discussed with treatment team.  Vital signs are within normal limits, patient has been medication compliant, she required trazodone 50 mg last night for sleep, has not required agitation protocol medications in the past 24 hours, slept 9.25 hours last night per nursing documentation.  Has been staying isolative to her room, and not attending unit group sessions as per nursing documentation.  During today's encounter, patient was seen while lying in bed in her room.  She denied SI/HI, reported that the last time she had auditory hallucinations was "on the 23rd", she presented with paranoia reported feeling like "everybody in the world" is telling her to "shut up", but then, denying that she was having AH, seemed not to be forthcoming with information or not to be accurately reporting. Did not seem to be responding to any internal/external stimuli.  Reported some thought broadcasting, stated that "everybody knows what I am thinking", also stated that  "everybody can put bad thoughts in my head". Reported feeling "woozy" when asked why she has been isolative to her room.  She is currently on Risperdal 2 mg twice daily, we will adjust this to 1 mg in the mornings and 3 at night to mitigate the side effect of daytime dizziness. Continuing other medications as listed below with no changes.   Patient denies being in any physical pain today, reports last bowel movement as being today.  Denies any feelings of stiffness. pt denies any feelings of stiffness. AIMS:4 (Lower as compared to prior to initiation of Ingrezza). Pt's tentative discharge date is Wednesday, April 2, as CSW will have to contact patient's Aunt to ascertain if she can return to [facility] with her, and will have to complete discharge planning.  It will have to be determined if patient has appropriate services in place for continuity of care in the community.  Total Time spent with patient:  45 minutes  Past Psychiatric History: See H &P  Past Medical History:  Past Medical History:  Diagnosis Date   Acquired acanthosis nigricans    Allergies    Asthma    Goiter    Goiter    Overweight(278.02)    Pre-diabetes    Schizophrenia (HCC)    Thyroiditis, autoimmune     Past Surgical History:  Procedure Laterality Date   MOUTH SURGERY     Family History:  Family History  Problem Relation Age of Onset   Obesity Mother    Heart disease Father    Cancer Maternal Grandmother    Diabetes Maternal Grandfather    Family Psychiatric  History: See H&P Social History:  Social History  Substance and Sexual Activity  Alcohol Use Yes   Comment: occasionally     Social History   Substance and Sexual Activity  Drug Use Not Currently   Types: Marijuana    Social History   Socioeconomic History   Marital status: Single    Spouse name: Not on file   Number of children: Not on file   Years of education: Not on file   Highest education level: 12th grade  Occupational History    Not on file  Tobacco Use   Smoking status: Every Day    Current packs/day: 2.00    Types: Cigarettes   Smokeless tobacco: Never  Vaping Use   Vaping status: Never Used  Substance and Sexual Activity   Alcohol use: Yes    Comment: occasionally   Drug use: Not Currently    Types: Marijuana   Sexual activity: Not Currently  Other Topics Concern   Not on file  Social History Narrative   Lives with mother and brother. 9th grade at [employer]. Plays basketball.    Social Drivers of Corporate investment banker Strain: Not on File (10/14/2021)   Received from [facility]    Financial Resource Strain: 0  Food Insecurity: Not on File (10/07/2022)   Received from [facility] Insecurity    Food: 0  Transportation Needs: Patient Declined (04/03/2023)   PRAPARE - Administrator, Civil Service (Medical): Patient declined    Lack of Transportation (Non-Medical): Patient declined  Physical Activity: Not on File (10/14/2021)   Received from [facility]   Physical Activity    Physical Activity: 0  Stress: Not on File (10/14/2021)   Received from [facility]   Stress    Stress: 0  Social Connections: Not on File (09/20/2022)   Received from [facility]   Social Connections    Connectedness: 0   Sleep: Poor  Appetite:  Good  Current Medications: Current Facility-Administered Medications  Medication Dose Route Frequency Provider Last Rate Last Admin   acetaminophen (TYLENOL) tablet 650 mg  650 mg Oral Q6H PRN Weber, Kyra A, NP   650 mg at 04/04/23 2056   albuterol (VENTOLIN HFA) 108 (90 Base) MCG/ACT inhaler 1-2 puff  1-2 puff Inhalation Q6H PRN Weber, Kyra A, NP       alum & mag hydroxide-simeth (MAALOX/MYLANTA) 200-200-20 MG/5ML suspension 30 mL  30 mL Oral Q4H PRN Weber, Kyra A, NP       haloperidol (HALDOL) tablet 5 mg  5 mg Oral TID PRN Phebe Colla A, NP   5 mg at 04/07/23 1716   And   diphenhydrAMINE (BENADRYL) capsule 50 mg  50 mg Oral TID PRN  Phebe Colla A, NP   50 mg at 04/07/23 1716   haloperidol lactate (HALDOL) injection 5 mg  5 mg Intramuscular TID PRN Weber, Kyra A, NP       And   diphenhydrAMINE (BENADRYL) injection 50 mg  50 mg Intramuscular TID PRN Weber, Bella Kennedy A, NP       And   LORazepam (ATIVAN) injection 2 mg  2 mg Intramuscular TID PRN Weber, Kyra A, NP       haloperidol lactate (HALDOL) injection 10 mg  10 mg Intramuscular TID PRN Weber, Kyra A, NP       And   diphenhydrAMINE (BENADRYL) injection 50 mg  50 mg Intramuscular TID PRN Weber, Leavy Cella, NP       And  LORazepam (ATIVAN) injection 2 mg  2 mg Intramuscular TID PRN Weber, Bella Kennedy A, NP       magnesium hydroxide (MILK OF MAGNESIA) suspension 30 mL  30 mL Oral Daily PRN Weber, Kyra A, NP       nicotine (NICODERM CQ - dosed in mg/24 hours) patch 21 mg  21 mg Transdermal Daily Bobbitt, Shalon E, NP   21 mg at 04/10/23 0856   pneumococcal 20-valent conjugate vaccine (PREVNAR 20) injection 0.5 mL  0.5 mL Intramuscular Tomorrow-1000 Bobbitt, Shalon E, NP       [START ON 04/11/2023] risperiDONE (RISPERDAL M-TABS) disintegrating tablet 1 mg  1 mg Oral Daily Cristi Gwynn, NP       risperiDONE (RISPERDAL M-TABS) disintegrating tablet 2 mg  2 mg Oral QHS Art Levan, NP       [START ON 04/11/2023] risperiDONE (RISPERDAL M-TABS) disintegrating tablet 3 mg  3 mg Oral QHS Slayter Moorhouse, NP       traZODone (DESYREL) tablet 50 mg  50 mg Oral QHS PRN Starleen Blue, NP   50 mg at 04/09/23 2024   valbenazine (INGREZZA) capsule 40 mg  40 mg Oral Daily Starleen Blue, NP   40 mg at 04/10/23 4540   Vitamin D (Ergocalciferol) (DRISDOL) 1.25 MG (50000 UNIT) capsule 50,000 Units  50,000 Units Oral Q7 days Starleen Blue, NP   50,000 Units at 04/05/23 1351   Lab Results:  No results found for this or any previous visit (from the past 48 hours).  Blood Alcohol level:  Lab Results  Component Value Date   ETH <10 04/03/2023   ETH <10 06/13/2020   Metabolic Disorder Labs: Lab Results   Component Value Date   HGBA1C 5.1 04/05/2023   MPG 99.67 04/05/2023   MPG 103 06/13/2020   Lab Results  Component Value Date   PROLACTIN 26.5 (H) 06/13/2020   PROLACTIN 16.6 07/28/2019   Lab Results  Component Value Date   CHOL 163 04/05/2023   TRIG 87 04/05/2023   HDL 38 (L) 04/05/2023   CHOLHDL 4.3 04/05/2023   VLDL 17 04/05/2023   LDLCALC 108 (H) 04/05/2023   LDLCALC 77 06/13/2020   Physical Findings: AIMS: 7 CIWA: n/a COWS:  n/a  Musculoskeletal: Strength & Muscle Tone: within normal limits Gait & Station: normal Patient leans: N/A  Psychiatric Specialty Exam:  Presentation  General Appearance:  Casual  Eye Contact: Fair  Speech: Clear and Coherent  Speech Volume: Normal  Handedness: Right   Mood and Affect  Mood: Depressed; Anxious  Affect: Congruent   Thought Process  Thought Processes: Coherent  Descriptions of Associations:Intact  Orientation:Full (Time, Place and Person)  Thought Content:Illogical  History of Schizophrenia/Schizoaffective disorder:No data recorded Duration of Psychotic Symptoms:No data recorded Hallucinations:Hallucinations: None    Ideas of Reference:Paranoia  Suicidal Thoughts:Suicidal Thoughts: No    Homicidal Thoughts:Homicidal Thoughts: No     Sensorium  Memory: Immediate Fair  Judgment: Fair  Insight: Fair   Art therapist  Concentration: Fair  Attention Span: Fair  Recall: Poor  Fund of Knowledge: Poor  Language: Fair   Psychomotor Activity  Psychomotor Activity: Psychomotor Activity: Normal   Assets  Assets: Resilience  Sleep  Sleep: Sleep: Good   Physical Exam: Physical Exam Vitals and nursing note reviewed.  Constitutional:      Appearance: She is obese.  HENT:     Head: Normocephalic.  Cardiovascular:     Rate and Rhythm: Normal rate.     Pulses: Normal pulses.  Pulmonary:  Effort: Pulmonary effort is normal.  Genitourinary:     Comments: Deferred Musculoskeletal:        General: Normal range of motion.     Cervical back: Normal range of motion.  Skin:    General: Skin is warm and dry.  Neurological:     General: No focal deficit present.     Mental Status: She is alert and oriented to person, place, and time.  Psychiatric:        Behavior: Behavior normal.    Review of Systems  Endo/Heme/Allergies:        Allergies: Peanuts.  Psychiatric/Behavioral:  Positive for depression, hallucinations (Reports visual hallucinations (seeing people).) and substance abuse (UDS (+) for THC). Negative for memory loss and suicidal ideas (Denies having a  plan/intent). The patient is nervous/anxious and has insomnia.   All other systems reviewed and are negative.  Blood pressure 110/71, pulse 100, temperature 97.9 F (36.6 C), temperature source Oral, resp. rate 16, height 6\' 1"  (1.854 m), weight 107.5 kg, last menstrual period 02/28/2023, SpO2 99%. Body mass index is 31.27 kg/m.  Treatment Plan Summary: Daily contact with patient to assess and evaluate symptoms and progress in treatment and Medication management    Diagnoses Principal Problem:   Paranoid schizophrenia (HCC) Active Problems:   Psychosis (HCC)   Depression   Anxiety   Delta-9-tetrahydrocannabinol (THC) dependence (HCC)   Tobacco use disorder   Insomnia  Plan: Safety and Monitoring: Voluntary admission to inpatient psychiatric unit for safety, stabilization and treatment Daily contact with patient to assess and evaluate symptoms and progress in treatment Patient's case to be discussed in multi-disciplinary team meeting Observation Level : q15 minute checks Vital signs: q12 hours Precautions: Safety gers associated with substance abuse/mental health issues will improve     Medication plan: -Continue Ingrezza 40 mg daily for TD -Continue Vitamin D 50.000 units weekly for low D levels  -Completed Amantadine taper  -Adjust Risperdal to 1 mg in  the AM and 3 mg nightly to help with daytime dizziness (previously 2 mg BID)-Give 2 mg x 1 dose on 2200, 3/30, & start above order on 3/31. -Continue Nicotine patch 21 mg daily for nicotine addiction    PRNS -Continue Trazodone 50 mg nightly PRN for sleep -Continue Tylenol 650 mg every 6 hours PRN for mild pain -Continue Maalox 30 mg every 4 hrs PRN for indigestion -Continue Milk of Magnesia as needed every 6 hrs for constipation   Reviewed Labs: No new labs ordered   Discharge Planning: Social work and case management to assist with discharge planning and identification of hospital follow-up needs prior to discharge Estimated LOS: 5-7 days Discharge Concerns: Need to establish a safety plan; Medication compliance and effectiveness Discharge Goals: Return home with outpatient referrals for mental health follow-up including medication management/psychotherapy   I certify that inpatient services furnished can reasonably be expected to improve the patient's condition.    Starleen Blue, NP 04/10/2023, 2:37 PM  Total Time Spent in Direct Patient Care:  I personally spent 45 minutes on the unit in direct patient care. The direct patient care time included face-to-face time with the patient, reviewing the patient's chart, communicating with other professionals, and coordinating care. Greater than 50% of this time was spent in counseling or coordinating care with the patient regarding goals of hospitalization, psycho-education, and discharge planning needs.   Phineas Inches, MD Psychiatrist    Patient ID: Heather Kramer, female   DOB: [birth year], 28 y.o.   MRN:  [id number]  

## 2023-04-10 NOTE — Progress Notes (Signed)
   04/10/23 0900  Psych Admission Type (Psych Patients Only)  Admission Status Voluntary  Psychosocial Assessment  Patient Complaints Depression  Eye Contact Fair  Facial Expression Flat  Affect Flat  Speech Logical/coherent  Interaction Minimal  Motor Activity Slow  Appearance/Hygiene Unremarkable  Behavior Characteristics Appropriate to situation  Mood Depressed  Thought Process  Coherency WDL  Content WDL  Delusions None reported or observed  Perception WDL  Hallucination None reported or observed  Judgment Poor  Confusion None  Danger to Self  Current suicidal ideation? Denies  Danger to Others  Danger to Others None reported or observed

## 2023-04-10 NOTE — Group Note (Signed)
 Date:  04/10/2023 Time:  8:47 PM  Group Topic/Focus:  Wrap-Up Group:   The focus of this group is to help patients review their daily goal of treatment and discuss progress on daily workbooks.    Participation Level:  Active  Participation Quality:  Appropriate  Affect:  Appropriate  Cognitive:  Appropriate  Insight: Appropriate  Engagement in Group:  Engaged  Modes of Intervention:  Discussion  Additional Comments:   Pt states that she had a "good" day and was able to participate in programming and activities. Pt enjoyed to visit with her aunt and spoke with her mother on the phone. Pt states she's leaving tomorrow ans feels prepared and safe to do so. Pt denied everything.  Heather Kramer 04/10/2023, 8:47 PM

## 2023-04-11 DIAGNOSIS — F200 Paranoid schizophrenia: Secondary | ICD-10-CM | POA: Diagnosis not present

## 2023-04-11 MED ORDER — NICOTINE POLACRILEX 2 MG MT GUM: 2.0000 mg | CHEWING_GUM | OROMUCOSAL | Status: DC | PRN

## 2023-04-11 NOTE — Progress Notes (Addendum)
 Collateral contact - Javona Bergevin (aunt) - [phone]   Aunt said that patient can't come to her home at this time.  She said that she tries to help patient find housing but it takes time, and it won't be available before discharge.  She said that patient's car is at her apartment, and patient's mom has the key (it wasn't intentional).    Collateral contact - [facility] [phone]  CSW informed [facility] that patient is in the hospital.   Conversation with patient: CSW gave patient a list of shelter resources.   Read Drivers, LCSWA 04/11/2023

## 2023-04-11 NOTE — Progress Notes (Signed)
 [facility] MD Progress Note  04/11/2023 4:42 PM Heather Kramer  MRN:  [id number]  Principal Problem: Paranoid schizophrenia (HCC)  Diagnosis: Principal Problem:   Paranoid schizophrenia (HCC) Active Problems:   Psychosis (HCC)   Depression   Anxiety   Delta-9-tetrahydrocannabinol (THC) dependence (HCC)   Tobacco use disorder   Insomnia  Reason for admission: 28 y.o. female with a prior mental health diagnoses of schizophrenia who presented to the [facility] ED on 04/03/23 with complaints of worsening auditory hallucinations commanding in nature, telling her to end her life. As per ED documentation, pt was non-compliant with meds, but restarted them last week.  Patient was medically cleared prior to being transferred to this behavioral health Hospital voluntarily for mood stabilization. As per chart review, patient was last hospitalized at this hospital on 07/28/2019.   Today's patient assessment note: On assessment today, the pt reports that their mood is euthymic, improved since admission, and stable. Denies feeling down, depressed, or sad.  Reports that anxiety symptoms are at manageable level.  Sleep is stable. Appetite is stable.  Concentration is without complaint.  Energy level is adequate. Denies having any suicidal thoughts. Denies having any suicidal intent and plan.  Denies having any HI.  Denies having psychotic symptoms.   Denies having side effects to current psychiatric medications.   Discussed discharge planning for tomorrow, 04/01, and CSW is coordinating dc planning with pt's mother and her ACTT. We are continuing all medications as listed below with no changes day. Contacting [facility] pharmacist to inquire how much the LAI will cost patient so that we can consider giving it to her prior to discharge tomorrow.    Total Time spent with patient:  45 minutes  Past Psychiatric History: See H &P  Past Medical History:  Past Medical History:  Diagnosis Date   Acquired  acanthosis nigricans    Allergies    Asthma    Goiter    Goiter    Overweight(278.02)    Pre-diabetes    Schizophrenia (HCC)    Thyroiditis, autoimmune     Past Surgical History:  Procedure Laterality Date   MOUTH SURGERY     Family History:  Family History  Problem Relation Age of Onset   Obesity Mother    Heart disease Father    Cancer Maternal Grandmother    Diabetes Maternal Grandfather    Family Psychiatric  History: See H&P Social History:  Social History   Substance and Sexual Activity  Alcohol Use Yes   Comment: occasionally     Social History   Substance and Sexual Activity  Drug Use Not Currently   Types: Marijuana    Social History   Socioeconomic History   Marital status: Single    Spouse name: Not on file   Number of children: Not on file   Years of education: Not on file   Highest education level: 12th grade  Occupational History   Not on file  Tobacco Use   Smoking status: Every Day    Current packs/day: 2.00    Types: Cigarettes   Smokeless tobacco: Never  Vaping Use   Vaping status: Never Used  Substance and Sexual Activity   Alcohol use: Yes    Comment: occasionally   Drug use: Not Currently    Types: Marijuana   Sexual activity: Not Currently  Other Topics Concern   Not on file  Social History Narrative   Lives with mother and brother. 9th grade at [employer]  Plays basketball.    Social Drivers of Corporate investment banker Strain: Not on File (10/14/2021)   Received from [employer]    Financial Resource Strain: 0  Food Insecurity: Not on File (10/07/2022)   Received from [facility] Insecurity    Food: 0  Transportation Needs: Patient Declined (04/03/2023)   PRAPARE - Administrator, Civil Service (Medical): Patient declined    Lack of Transportation (Non-Medical): Patient declined  Physical Activity: Not on File (10/14/2021)   Received from [facility]   Physical  Activity    Physical Activity: 0  Stress: Not on File (10/14/2021)   Received from [facility]   Stress    Stress: 0  Social Connections: Not on File (09/20/2022)   Received from [employer]   Social Connections    Connectedness: 0   Sleep: Poor  Appetite:  Good  Current Medications: Current Facility-Administered Medications  Medication Dose Route Frequency Provider Last Rate Last Admin   acetaminophen (TYLENOL) tablet 650 mg  650 mg Oral Q6H PRN Weber, Kyra A, NP   650 mg at 04/11/23 1305   albuterol (VENTOLIN HFA) 108 (90 Base) MCG/ACT inhaler 1-2 puff  1-2 puff Inhalation Q6H PRN Weber, Kyra A, NP       alum & mag hydroxide-simeth (MAALOX/MYLANTA) 200-200-20 MG/5ML suspension 30 mL  30 mL Oral Q4H PRN Weber, Kyra A, NP       haloperidol (HALDOL) tablet 5 mg  5 mg Oral TID PRN Phebe Colla A, NP   5 mg at 04/07/23 1716   And   diphenhydrAMINE (BENADRYL) capsule 50 mg  50 mg Oral TID PRN Phebe Colla A, NP   50 mg at 04/07/23 1716   haloperidol lactate (HALDOL) injection 5 mg  5 mg Intramuscular TID PRN Weber, Bella Kennedy A, NP       And   diphenhydrAMINE (BENADRYL) injection 50 mg  50 mg Intramuscular TID PRN Weber, Bella Kennedy A, NP       And   LORazepam (ATIVAN) injection 2 mg  2 mg Intramuscular TID PRN Weber, Kyra A, NP       haloperidol lactate (HALDOL) injection 10 mg  10 mg Intramuscular TID PRN Weber, Kyra A, NP       And   diphenhydrAMINE (BENADRYL) injection 50 mg  50 mg Intramuscular TID PRN Weber, Kyra A, NP       And   LORazepam (ATIVAN) injection 2 mg  2 mg Intramuscular TID PRN Weber, Kyra A, NP       magnesium hydroxide (MILK OF MAGNESIA) suspension 30 mL  30 mL Oral Daily PRN Weber, Kyra A, NP       nicotine (NICODERM CQ - dosed in mg/24 hours) patch 21 mg  21 mg Transdermal Daily Bobbitt, Shalon E, NP   21 mg at 04/11/23 8657   nicotine polacrilex (NICORETTE) gum 2 mg  2 mg Oral PRN Massengill, Harrold Donath, MD       pneumococcal 20-valent conjugate vaccine (PREVNAR 20) injection 0.5 mL  0.5 mL  Intramuscular Tomorrow-1000 Bobbitt, Shalon E, NP       risperiDONE (RISPERDAL M-TABS) disintegrating tablet 1 mg  1 mg Oral Daily Brahim Dolman, NP   1 mg at 04/11/23 0820   risperiDONE (RISPERDAL M-TABS) disintegrating tablet 3 mg  3 mg Oral QHS Lawrence Roldan, NP       traZODone (DESYREL) tablet 50 mg  50 mg Oral QHS PRN Starleen Blue, NP  50 mg at 04/10/23 2037   valbenazine (INGREZZA) capsule 40 mg  40 mg Oral Daily Starleen Blue, NP   40 mg at 04/11/23 0820   Vitamin D (Ergocalciferol) (DRISDOL) 1.25 MG (50000 UNIT) capsule 50,000 Units  50,000 Units Oral Q7 days Starleen Blue, NP   50,000 Units at 04/05/23 1351   Lab Results:  No results found for this or any previous visit (from the past 48 hours).  Blood Alcohol level:  Lab Results  Component Value Date   ETH <10 04/03/2023   ETH <10 06/13/2020   Metabolic Disorder Labs: Lab Results  Component Value Date   HGBA1C 5.1 04/05/2023   MPG 99.67 04/05/2023   MPG 103 06/13/2020   Lab Results  Component Value Date   PROLACTIN 26.5 (H) 06/13/2020   PROLACTIN 16.6 07/28/2019   Lab Results  Component Value Date   CHOL 163 04/05/2023   TRIG 87 04/05/2023   HDL 38 (L) 04/05/2023   CHOLHDL 4.3 04/05/2023   VLDL 17 04/05/2023   LDLCALC 108 (H) 04/05/2023   LDLCALC 77 06/13/2020   Physical Findings: AIMS: 7 CIWA: n/a COWS:  n/a  Musculoskeletal: Strength & Muscle Tone: within normal limits Gait & Station: normal Patient leans: N/A  Psychiatric Specialty Exam:  Presentation  General Appearance:  Appropriate for Environment  Eye Contact: Fair  Speech: Clear and Coherent  Speech Volume: Normal  Handedness: Right   Mood and Affect  Mood: Euthymic  Affect: Congruent   Thought Process  Thought Processes: Coherent  Descriptions of Associations:Intact  Orientation:Full (Time, Place and Person)  Thought Content:Logical  History of Schizophrenia/Schizoaffective disorder:No data  recorded Duration of Psychotic Symptoms:No data recorded Hallucinations:Hallucinations: None    Ideas of Reference:None  Suicidal Thoughts:Suicidal Thoughts: No    Homicidal Thoughts:Homicidal Thoughts: No     Sensorium  Memory: Immediate Fair  Judgment: Fair  Insight: Fair   Art therapist  Concentration: Fair  Attention Span: Fair  Recall: Fair  Fund of Knowledge: Fair  Language: Fair   Psychomotor Activity  Psychomotor Activity: Psychomotor Activity: Normal   Assets  Assets: Desire for Improvement  Sleep  Sleep: Sleep: Good   Physical Exam: Physical Exam Vitals and nursing note reviewed.  Constitutional:      Appearance: She is obese.  HENT:     Head: Normocephalic.  Cardiovascular:     Rate and Rhythm: Normal rate.     Pulses: Normal pulses.  Pulmonary:     Effort: Pulmonary effort is normal.  Genitourinary:    Comments: Deferred Musculoskeletal:        General: Normal range of motion.     Cervical back: Normal range of motion.  Skin:    General: Skin is warm and dry.  Neurological:     General: No focal deficit present.     Mental Status: She is alert and oriented to person, place, and time.  Psychiatric:        Behavior: Behavior normal.    Review of Systems  Endo/Heme/Allergies:        Allergies: Peanuts.  Psychiatric/Behavioral:  Positive for depression, hallucinations (Reports visual hallucinations (seeing people).) and substance abuse (UDS (+) for THC). Negative for memory loss and suicidal ideas (Denies having a  plan/intent). The patient is nervous/anxious and has insomnia.   All other systems reviewed and are negative.  Blood pressure 129/73, pulse 100, temperature 98 F (36.7 C), temperature source Oral, resp. rate 20, height 6\' 1"  (1.854 m), weight 107.5 kg, last menstrual period  02/28/2023, SpO2 99%. Body mass index is 31.27 kg/m.  Treatment Plan Summary: Daily contact with patient to assess and  evaluate symptoms and progress in treatment and Medication management    Diagnoses Principal Problem:   Paranoid schizophrenia (HCC) Active Problems:   Psychosis (HCC)   Depression   Anxiety   Delta-9-tetrahydrocannabinol (THC) dependence (HCC)   Tobacco use disorder   Insomnia  Plan: Safety and Monitoring: Voluntary admission to inpatient psychiatric unit for safety, stabilization and treatment Daily contact with patient to assess and evaluate symptoms and progress in treatment Patient's case to be discussed in multi-disciplinary team meeting Observation Level : q15 minute checks Vital signs: q12 hours Precautions: Safety gers associated with substance abuse/mental health issues will improve     Medication plan: -Continue Ingrezza 40 mg daily for TD -Continue Vitamin D 50.000 units weekly for low D levels  -Completed Amantadine taper  -Continue Risperdal 1 mg in the AM and 3 mg nightly for psychosis -Continue Nicotine patch 21 mg daily for nicotine addiction    PRNS -Continue Trazodone 50 mg nightly PRN for sleep -Continue Tylenol 650 mg every 6 hours PRN for mild pain -Continue Maalox 30 mg every 4 hrs PRN for indigestion -Continue Milk of Magnesia as needed every 6 hrs for constipation   Reviewed Labs: No new labs ordered   Discharge Planning: Social work and case management to assist with discharge planning and identification of hospital follow-up needs prior to discharge Estimated LOS: 5-7 days Discharge Concerns: Need to establish a safety plan; Medication compliance and effectiveness Discharge Goals: Return home with outpatient referrals for mental health follow-up including medication management/psychotherapy   I certify that inpatient services furnished can reasonably be expected to improve the patient's condition.    Starleen Blue, NP 04/11/2023, 4:42 PM  Total Time Spent in Direct Patient Care:  I personally spent 45 minutes on the unit in direct patient  care. The direct patient care time included face-to-face time with the patient, reviewing the patient's chart, communicating with other professionals, and coordinating care. Greater than 50% of this time was spent in counseling or coordinating care with the patient regarding goals of hospitalization, psycho-education, and discharge planning needs.   Patient ID: Heather Kramer, female   DOB: 31-May-1995, 28 y.o.   MRN: [id number] Patient ID: Heather Kramer, female   DOB: [birth year], 28 y.o.   MRN: [id number]

## 2023-04-11 NOTE — Group Note (Signed)
 Recreation Therapy Group Note   Group Topic:Healthy Decision Making  Group Date: 04/11/2023 Start Time: 1021 End Time: 1041 Facilitators: Stancil Deisher-McCall, LRT,CTRS Location: 500 Hall Dayroom   Group Topic: Decision Making, Problem Solving, Communication  Goal Area(s) Addresses:  Patient will effectively work with peer towards shared goal.  Patient will identify factors that guided their decision making.  Patient will pro-socially communicate ideas during group session.   Intervention: Survival Scenario - pencil, paper  Activity: Patients were given a scenario that they were going to be stranded on a deserted [place] for several months before being rescued. Writer tasked them with making a list of 15 things they would choose to bring with them for "survival". The list of items was prioritized most important to least. Each patient would come up with their own list, then work together to create a new list of 15 items while in a group of 3-5 peers. LRT discussed each person's list and how it differed from others. The debrief included discussion of priorities, good decisions versus bad decisions, and how it is important to think before acting so we can make the best decision possible. LRT tied the concept of effective communication among group members to patient's support systems outside of the hospital and its benefit post discharge.  Education: Pharmacist, community, Priorities, Support System, Discharge Planning   Education Outcome: Acknowledges education/In group clarification/Needs additional education   Affect/Mood: Flat   Participation Level: Active   Participation Quality: Independent   Behavior: Appropriate   Speech/Thought Process: Relevant   Insight: Fair   Judgement: Fair    Modes of Intervention: Group work   Patient Response to Interventions:  Receptive   Education Outcome:  In group clarification offered    Clinical Observations/Individualized Feedback: Pt was  late to group. Pt didn't work with peers but came up with phone, weed, various family members, food, head phones, car, pillow, blanket, flare gun and water on her own.     Plan: Continue to engage patient in RT group sessions 2-3x/week.   Berea Majkowski-McCall, LRT,CTRS 04/11/2023 3:01 PM

## 2023-04-11 NOTE — Plan of Care (Signed)
   Problem: Education: Goal: Emotional status will improve Outcome: Progressing Goal: Mental status will improve Outcome: Progressing   Problem: Activity: Goal: Interest or engagement in activities will improve Outcome: Progressing Goal: Sleeping patterns will improve Outcome: Progressing

## 2023-04-11 NOTE — Progress Notes (Signed)
   04/11/23 1945  Psych Admission Type (Psych Patients Only)  Admission Status Voluntary  Psychosocial Assessment  Patient Complaints None  Eye Contact Fair  Facial Expression Flat  Affect Sad  Speech Logical/coherent  Interaction Assertive  Motor Activity Slow  Appearance/Hygiene Unremarkable  Behavior Characteristics Cooperative  Mood Sad  Aggressive Behavior  Effect No apparent injury  Thought Process  Coherency WDL  Content WDL  Delusions WDL  Perception WDL  Hallucination None reported or observed  Judgment Impaired  Confusion WDL  Danger to Self  Current suicidal ideation? Denies  Danger to Others  Danger to Others None reported or observed

## 2023-04-11 NOTE — Progress Notes (Signed)
   04/11/23 0819  Psych Admission Type (Psych Patients Only)  Admission Status Voluntary  Psychosocial Assessment  Patient Complaints None  Eye Contact Fair  Facial Expression Flat  Affect Flat  Speech Logical/coherent  Interaction Assertive  Motor Activity Slow  Appearance/Hygiene Unremarkable  Behavior Characteristics Cooperative  Mood Depressed  Thought Process  Coherency WDL  Content WDL  Delusions None reported or observed  Perception WDL  Hallucination None reported or observed  Judgment Impaired  Confusion None  Danger to Self  Current suicidal ideation? Denies  Agreement Not to Harm Self Yes  Description of Agreement verbal  Danger to Others  Danger to Others None reported or observed

## 2023-04-11 NOTE — Group Note (Signed)
 Date:  04/11/2023 Time:  9:21 PM  Group Topic/Focus:  Wrap-Up Group:   The focus of this group is to help patients review their daily goal of treatment and discuss progress on daily workbooks.    Participation Level:  Did not attend. Lita Mains [facility]  04/11/2023, 9:21 PM

## 2023-04-11 NOTE — Group Note (Signed)
 LCSW Group Therapy Note   Group Date: 04/11/2023 Start Time: 1300 End Time: 1400   Participation:  patient was present and participated in the discussion.   Type of Therapy:  Group Therapy    Title:  Understanding Your Path to Change   Objective:  The goal is to help individuals understand the stages of change, identify where they currently are in the process, and provide actionable next steps to continue moving forward in their journey of change.   Goals: Learn about the six stages of change:  Precontemplation, Contemplation, Preparation, Action, Maintenance, and Relapse Reflect on Current Change Efforts:  Recognize which stage participants are in regarding a personal change. Plan Next Steps for Moving Forward:  Create an action plan based on their current stage of change.   Class Summary:  In this session, we explored the Stages of Change as a framework to understand the process of change.  We discussed how each stage helps individuals recognize where they are in their personal journey and used the Stages of Change Worksheet for self-reflection. Participants answered questions to better understand their current stage, challenges, and progress. We also emphasized the importance of moving forward, even if setbacks (Relapse) occur, and created actionable steps to help participants continue progressing. By the end of the session, participants gained a clearer understanding of their path to change and left with a clear plan for next steps.   Therapeutic Modalities:  Elements of CBT (cognitive restructuring, problem solving)  Element of DBT (mindfulness, distress tolerance)    Lachandra Dettmann O Mykala Mccready, LCSWA 04/11/2023  5:56 PM

## 2023-04-11 NOTE — [facility] Suicide Risk Assessment (Signed)
 [facility] INPATIENT:  Family/Significant Other Suicide Prevention Education  Suicide Prevention Education:  Education Completed; Herbie [place] mom) [phone],  (name of family member/significant other) has been identified by the patient as the family member/significant other with whom the patient will be residing, and identified as the person(s) who will aid the patient in the event of a mental health crisis (suicidal ideations/suicide attempt).  With written consent from the patient, the family member/significant other has been provided the following suicide prevention education, prior to the and/or following the discharge of the patient.  Patient's mom said that she will pick up patient tomorrow at 5:15 PM, if patient is discharged.  She said that patient doesn't have any guns but does have a knife.  Mom will help secure it, along with any other sharp objects (such as scissors, if there are any).  She will also assist patient in securing medications.  Mom doesn't have any concerns about patient's discharge. Mom wasn't sure where patient will stay tomorrow night.   The suicide prevention education provided includes the following: Suicide risk factors Suicide prevention and interventions National Suicide Hotline telephone number [facility] assessment telephone number [facility] Emergency Assistance 911 [facility] and/or Residential Mobile Crisis Unit telephone number  Request made of family/significant other to: Remove weapons (e.g., guns, rifles, knives), all items previously/currently identified as safety concern.   Remove drugs/medications (over-the-counter, prescriptions, illicit drugs), all items previously/currently identified as a safety concern.  The family member/significant other verbalizes understanding of the suicide prevention education information provided.  The family member/significant other agrees to remove the items of safety concern listed above.  Alfredia Ferguson Tiffiny Worthy, LCSWA 04/11/2023, 6:54 PM

## 2023-04-12 ENCOUNTER — Other Ambulatory Visit (HOSPITAL_COMMUNITY): Payer: Self-pay

## 2023-04-12 ENCOUNTER — Telehealth (HOSPITAL_COMMUNITY): Payer: Self-pay | Admitting: Pharmacy Technician

## 2023-04-12 DIAGNOSIS — F200 Paranoid schizophrenia: Secondary | ICD-10-CM | POA: Diagnosis not present

## 2023-04-12 MED ORDER — VALBENAZINE TOSYLATE 40 MG PO CAPS: 40.0000 mg | ORAL_CAPSULE | Freq: Every day | ORAL | 0 refills | Status: DC

## 2023-04-12 MED ORDER — RISPERIDONE 3 MG PO TBDP: 3.0000 mg | ORAL_TABLET | Freq: Every day | ORAL | 0 refills | Status: DC

## 2023-04-12 MED ORDER — RISPERIDONE MICROSPHERES ER 25 MG IM SRER
25.0000 mg | INTRAMUSCULAR | Status: DC
  Filled 2023-04-12: qty 2

## 2023-04-12 MED ORDER — TRAZODONE HCL 50 MG PO TABS: 50.0000 mg | ORAL_TABLET | Freq: Every evening | ORAL | 0 refills | Status: DC | PRN

## 2023-04-12 MED ORDER — ALBUTEROL SULFATE HFA 108 (90 BASE) MCG/ACT IN AERS: 1.0000 | INHALATION_SPRAY | Freq: Four times a day (QID) | RESPIRATORY_TRACT | 0 refills | Status: DC | PRN

## 2023-04-12 MED ORDER — RISPERIDONE 1 MG PO TBDP: 1.0000 mg | ORAL_TABLET | Freq: Every day | ORAL | 0 refills | Status: DC

## 2023-04-12 MED ORDER — NICOTINE 21 MG/24HR TD PT24: 21.0000 mg | MEDICATED_PATCH | Freq: Every day | TRANSDERMAL | 0 refills | Status: DC

## 2023-04-12 MED ORDER — RISPERIDONE MICROSPHERES ER 25 MG IM SRER: 25.0000 mg | INTRAMUSCULAR | 0 refills | Status: DC

## 2023-04-12 MED ORDER — VITAMIN D (ERGOCALCIFEROL) 1.25 MG (50000 UNIT) PO CAPS: 50000.0000 [IU] | ORAL_CAPSULE | ORAL | 0 refills | Status: DC

## 2023-04-12 NOTE — Progress Notes (Signed)
 D: Pt A & O X 3. Denies SI, HI, AVH and pain at this time. D/C home as ordered. Picked up in lobby by her mother. A: D/C instructions reviewed with pt and mother including prescriptions and follow up appointment with her assigned ACTT team ([facility]). Compliance encouraged. All belongings from locker 44 given to pt at time of departure. Scheduled  medications given with verbal education and effects monitored. Safety checks maintained without incident till time of d/c.  R: Pt receptive to care. Compliant with medications when offered. Denies adverse drug reactions when assessed. Verbalized understanding related to d/c instructions. Signed belonging sheet in agreement with items received from locker. Ambulatory with a steady gait. Appears to be in no physical distress at time of departure.

## 2023-04-12 NOTE — Care Management Important Message (Signed)
 Medicare IM printed and given to social work to give to the patient today.

## 2023-04-12 NOTE — Telephone Encounter (Signed)
 Patient Product/process development scientist completed.    The patient is insured through [facility]. Patient has Medicare and is not eligible for a copay card, but may be able to apply for patient assistance or Medicare RX Payment Plan (Patient Must reach out to their plan, if eligible for payment plan), if available.    Ran test claim for Invega 156 mg/ml  and the current 30 day co-pay is $0.00.  Ran test claim for Uzedy 75 mg /0.21 ml  and Product Not Covered  This test claim was processed through [employer]- copay amounts may vary at other pharmacies due to [employer], or as the patient moves through the different stages of their insurance plan.     Roland Earl, CPHT Pharmacy Technician III Certified Patient Advocate [facility] Patient Advocate Team Direct Number: [phone]  Fax: [phone]

## 2023-04-12 NOTE — Progress Notes (Addendum)
  [facility] Adult Case Management Discharge Plan :  Will you be returning to the same living situation after discharge:  Yes,  patient is unhoused .  Patient will discharge to her mom. At discharge, do you have transportation home?: Yes,  patient's mom Harriett Laural Benes (mom) [phone] will pick her up at 5:15 PM  Do you have the ability to pay for your medications: Yes,  patient has insurance  Release of information consent forms completed and in the chart;  Patient's signature needed at discharge.  Patient to Follow up at:  Follow-up Information     [facility] Call.   Why: Please follow up with this provider for ACTT services. Contact information: [address] [place] [address] [phone]                 Next level of care provider has access to [facility] Link:no  Safety Planning and Suicide Prevention discussed: Yes,  Harriett Laural Benes (mom) [phone]  and patient  Has patient been referred to the Quitline?: Patient said that she quit smoking and doesn't need a print-out.  Patient has been referred for addiction treatment:  Patient said that she uses marijuana and wants to quit.  She said that she will work on it with her substance use counselor Systems developer - ACTT).  Fedora Knisely O Kameryn Davern, LCSWA 04/12/2023, 12:04 PM

## 2023-04-12 NOTE — Progress Notes (Signed)
 Collateral contact - [facility]T [phone]   CSW informed ACT Team that patient will be discharged today.  Medical Records will fax the discharge summary to [phone].  Sharmayne Jablon, LCSWA 04/12/2023

## 2023-04-12 NOTE — Group Note (Signed)
 Date:  04/12/2023 Time:  9:53 AM  Group Topic/Focus:  Coping With Mental Health Crisis:   The purpose of this group is to help patients identify strategies for coping with mental health crisis.  Group discusses possible causes of crisis and ways to manage them effectively. Emotional Education:   The focus of this group is to discuss what feelings/emotions are, and how they are experienced. Goals Group:   The focus of this group is to help patients establish daily goals to achieve during treatment and discuss how the patient can incorporate goal setting into their daily lives to aide in recovery.    Participation Level:  Did not attend  Participation Quality:    Affect:    Cognitive:    Insight:   Engagement in Group:    Modes of Intervention:    Additional Comments:    Gardiner Barefoot 04/12/2023, 9:53 AM

## 2023-04-12 NOTE — Progress Notes (Signed)
 Collateral contact - Harri[place] - Laural Benes (mom) [phone]  Mom confirmed that she will pick up patient at 5:15 PM.   Read Drivers, LCSWA 04/12/2023

## 2023-04-12 NOTE — [facility] Suicide Risk Assessment (Addendum)
 Suicide Risk Assessment  Discharge Assessment    [facility] Discharge Suicide Risk Assessment   Principal Problem: Paranoid schizophrenia [facility]) Discharge Diagnoses: Principal Problem:   Paranoid schizophrenia (HCC) Active Problems:   Psychosis (HCC)   Depression   Anxiety   Delta-9-tetrahydrocannabinol (THC) dependence (HCC)   Tobacco use disorder   Insomnia  Reason for admission: 28 y.o. female with a prior mental health diagnoses of schizophrenia who presented to the [facility] ED on 04/03/23 with complaints of worsening auditory hallucinations commanding in nature, telling her to end her life. As per ED documentation, pt was non-compliant with meds, but restarted them last week.  Patient was medically cleared prior to being transferred to this behavioral health Hospital voluntarily for mood stabilization. As per chart review, patient was last hospitalized at this hospital on 07/28/2019.   Hospital Course: During the patient's hospitalization, patient had extensive initial psychiatric evaluation, and follow-up psychiatric evaluations every day. Psychiatric diagnoses provided upon initial assessment: Schizoaffective d/o, bipolar type.  Patient's psychiatric medications were adjusted on admission:As follows: -Start Ingrezza 40 mg daily for TD -Discontinue Amantadine  -Continue Risperdal 4 mg nightly for psychosis/mood stabilization -Continue Nicotine patch 21 mg daily for nicotine addiction  During the hospitalization, other adjustments were made to the patient's psychiatric medication regimen.-Completed Amantadine taper.Medications at discharge are as follows: -Continue Ingrezza 40 mg daily for TD -Continue Vitamin D 50.000 units weekly for low D levels  -Continue Risperdal 1 mg in the AM and 3 mg nightly for psychosis-Recommending risperidone perseris injection monthly as outpatient. We considered starting this medication inpatient, but did not have it inpatient. Lipid panel, Ha1c, Tsh WNL.   -Continue Nicotine patch 21 mg daily for nicotine addiction -Continue Trazodone 50 mg nightly as needed for sleep  Patient's care was discussed during the interdisciplinary team meeting every day during the hospitalization. The patient denies having side effects to prescribed psychiatric medication. TD symptoms have significantly improved since initiating Ingrezza.  Gradually, patient started adjusting to milieu. The patient was evaluated each day by a clinical provider to ascertain response to treatment. Improvement was noted by the patient's report of decreasing symptoms, improved sleep and appetite, affect, medication tolerance, behavior, and participation in unit programming.  Patient was asked each day to complete a self inventory noting mood, mental status, pain, new symptoms, anxiety and concerns. Symptoms were reported as significantly decreased or resolved completely by discharge.   On day of discharge, the patient reports that their mood is stable. The patient denied having suicidal thoughts for more than 48 hours prior to discharge.  Patient denies having homicidal thoughts.  Patient denies having auditory hallucinations.  Patient denies any visual hallucinations or other symptoms of psychosis. The patient was motivated to continue taking medication with a goal of continued improvement in mental health. The patient reports their target psychiatric symptoms of psychosis, depression, anxiety, responded well to the psychiatric medications, and the patient reports overall benefit other psychiatric hospitalization. Supportive psychotherapy was provided to the patient. The patient also participated in regular group therapy while hospitalized. Coping skills, problem solving as well as relaxation therapies were also part of the unit programming.  Labs were reviewed with the patient, and abnormal results were discussed with the patient.  The patient is able to verbalize their individual safety plan to  this provider.  # It is recommended to the patient to continue psychiatric medications as prescribed, after discharge from the hospital.    # It is recommended to the patient to follow up  with your outpatient psychiatric provider and PCP.  # It was discussed with the patient, the impact of alcohol, drugs, tobacco have been there overall psychiatric and medical wellbeing, and total abstinence from substance use was recommended the patient.ed.  # Prescriptions provided or sent directly to preferred pharmacy at discharge. Patient agreeable to plan. Given opportunity to ask questions. Appears to feel comfortable with discharge.    # In the event of worsening symptoms, the patient is instructed to call the crisis hotline, 988, 911, go to the [facility] health urgent care center, and or go to the nearest ED for appropriate evaluation and treatment of symptoms. To follow-up with primary care provider for other medical issues, concerns and or health care needs  # Patient was discharged with a plan to follow up as noted below.   Total Time spent with patient: 45 minutes  Musculoskeletal: Strength & Muscle Tone: within normal limits Gait & Station: normal Patient leans: N/A  Psychiatric Specialty Exam  Presentation  General Appearance:  Appropriate for Environment  Eye Contact: Fair  Speech: Clear and Coherent  Speech Volume: Normal  Handedness: Right   Mood and Affect  Mood: Euthymic  Duration of Depression Symptoms: No data recorded Affect: Congruent   Thought Process  Thought Processes: Coherent  Descriptions of Associations:Intact  Orientation:Full (Time, Place and Person)  Thought Content:Logical  History of Schizophrenia/Schizoaffective disorder:No data recorded Duration of Psychotic Symptoms:No data recorded Hallucinations:Hallucinations: None  Ideas of Reference:None  Suicidal Thoughts:Suicidal Thoughts: No  Homicidal Thoughts:Homicidal  Thoughts: No   Sensorium  Memory: Immediate Fair  Judgment: Fair  Insight: Fair   Art therapist  Concentration: Fair  Attention Span: Fair  Recall: Fair  Fund of Knowledge: Fair  Language: Fair   Psychomotor Activity  Psychomotor Activity: Psychomotor Activity: Normal  Assets  Assets: Desire for Improvement  Sleep  Sleep: Sleep: Good  Physical Exam: Physical Exam Constitutional:      Appearance: Normal appearance.  Musculoskeletal:     Cervical back: Normal range of motion.  Neurological:     General: No focal deficit present.     Mental Status: She is alert and oriented to person, place, and time.  Psychiatric:        Mood and Affect: Mood normal.        Behavior: Behavior normal.        Thought Content: Thought content normal.        Judgment: Judgment normal.    Review of Systems  Psychiatric/Behavioral:  Positive for depression (Denies SI/HI/AVH, denies plan or intent to harm self or others) and substance abuse (Educated on cessation and on the negative side effects on her mental health). Negative for hallucinations, memory loss and suicidal ideas. The patient is nervous/anxious (stable for management outpatient) and has insomnia (stable).   All other systems reviewed and are negative.  Blood pressure 117/75, pulse 89, temperature 98.4 F (36.9 C), resp. rate 20, height 6\' 1"  (1.854 m), weight 107.5 kg, last menstrual period 02/28/2023, SpO2 99%. Body mass index is 31.27 kg/m.  Mental Status Per Nursing Assessment::   On Admission:  Suicidal ideation indicated by others Denies Suicidal ideation, denies intent or plan to harm self or others at time of discharge. Demographic Factors:  Female  Loss Factors: Financial problems/change in socioeconomic status  Historical Factors: Family history of mental illness or substance abuse  Risk Reduction Factors:   Positive social support  Continued Clinical Symptoms:  Previous Psychiatric  Diagnoses and Treatments  Cognitive  Features That Contribute To Risk:  None    Suicide Risk:  Minimal: No identifiable suicidal ideation.  Patients presenting with no risk factors but with morbid ruminations; may be classified as minimal risk based on the severity of the depressive symptoms    Follow-up Information     [facility] Call.   Why: Please follow up with this provider for ACTT services. Contact information: [address] [place] [address] [phone]                Starleen Blue, NP 04/12/2023, 1:56 PM

## 2023-04-12 NOTE — [facility] Suicide Risk Assessment (Signed)
 [facility] INPATIENT:  Family/Significant Other Suicide Prevention Education  Suicide Prevention Education:  Education Completed; with patient,  (name of family member/significant other) has been identified by the patient as the family member/significant other with whom the patient will be residing, and identified as the person(s) who will aid the patient in the event of a mental health crisis (suicidal ideations/suicide attempt).  With written consent from the patient, the family member/significant other has been provided the following suicide prevention education, prior to the and/or following the discharge of the patient.  Patient was given "Suicide Prevention Education" brochure.  Patient said that she doesn't have any guns.  She admitted to having a knife; she said it's secured in her car.    The suicide prevention education provided includes the following: Suicide risk factors Suicide prevention and interventions National Suicide Hotline telephone number [facility] assessment telephone number [facility] Emergency Assistance 911 [facility] and/or Residential Mobile Crisis Unit telephone number  Request made of family/significant other to: Remove weapons (e.g., guns, rifles, knives), all items previously/currently identified as safety concern.   Remove drugs/medications (over-the-counter, prescriptions, illicit drugs), all items previously/currently identified as a safety concern.  The family member/significant other verbalizes understanding of the suicide prevention education information provided.  The family member/significant other agrees to remove the items of safety concern listed above.  Juletta Berhe O [name], LCSWA 04/12/2023, 10:56 AM

## 2023-04-12 NOTE — Care Management Important Message (Signed)
 Patient informed of right to appeal discharge, provided phone number to [facility]. Patient expressed no interest in appealing discharge at this time. CSW will continue to monitor situation.   Lener Ventresca, LCSWA 04/23/2023

## 2023-04-12 NOTE — TOC Benefit Eligibility Note (Signed)
 Pharmacy Patient Advocate Encounter  Insurance verification completed.    The patient is insured through  [employer] Part D . Patient has Medicare and is not eligible for a copay card, but may be able to apply for patient assistance or Medicare RX Payment Plan (Patient Must reach out to their plan, if eligible for payment plan), if available.    Ran test claim for Risperidone 25mg  (Risperdal Consta) and the current 30 day co-pay is $0.  Ran test claim for Perseris 120mg  and the current 30 day co-pay is $0.  This test claim was processed through [employer]- copay amounts may vary at other pharmacies due to [employer], or as the patient moves through the different stages of their insurance plan.

## 2023-04-18 NOTE — Discharge Summary (Signed)
 Physician Discharge Summary Note  Patient:  Heather Kramer is an 28 y.o., female MRN:  [id number] DOB:  [birth year] Patient phone:  [phone] (home)  Patient address:   [address] [place] [address],  Total Time spent with patient: 45 minutes  Date of Admission:  04/03/2023 Date of Discharge: 04/18/2023  Reason for Admission: 28 y.o. female with a prior mental health diagnoses of schizophrenia who presented to the [facility] ED on 04/03/23 with complaints of worsening auditory hallucinations commanding in nature, telling her to end her life. As per ED documentation, pt was non-compliant with meds, but restarted them last week.  Patient was medically cleared prior to being transferred to this behavioral health Hospital voluntarily for mood stabilization. As per chart review, patient was last hospitalized at this hospital on 07/28/2019.   Hospital Course: During the patient's hospitalization, patient had extensive initial psychiatric evaluation, and follow-up psychiatric evaluations every day. Psychiatric diagnoses provided upon initial assessment: Schizoaffective d/o, bipolar type.   Patient's psychiatric medications were adjusted on admission:As follows: -Start Ingrezza 40 mg daily for TD -Discontinue Amantadine  -Continue Risperdal 4 mg nightly for psychosis/mood stabilization -Continue Nicotine patch 21 mg daily for nicotine addiction   During the hospitalization, other adjustments were made to the patient's psychiatric medication regimen.-Completed Amantadine taper.Medications at discharge are as follows: -Continue Ingrezza 40 mg daily for TD -Continue Vitamin D 50.000 units weekly for low D levels  -Continue Risperdal 1 mg in the AM and 3 mg nightly for psychosis-Recommending risperidone perseris injection monthly as outpatient. We considered starting this medication inpatient, but did not have it inpatient. Lipid panel, Ha1c, Tsh WNL.  -Continue Nicotine patch 21 mg  daily for nicotine addiction -Continue Trazodone 50 mg nightly as needed for sleep   Patient's care was discussed during the interdisciplinary team meeting every day during the hospitalization. The patient denies having side effects to prescribed psychiatric medication. TD symptoms have significantly improved since initiating Ingrezza.   Gradually, patient started adjusting to milieu. The patient was evaluated each day by a clinical provider to ascertain response to treatment. Improvement was noted by the patient's report of decreasing symptoms, improved sleep and appetite, affect, medication tolerance, behavior, and participation in unit programming.  Patient was asked each day to complete a self inventory noting mood, mental status, pain, new symptoms, anxiety and concerns. Symptoms were reported as significantly decreased or resolved completely by discharge.    On day of discharge, the patient reports that their mood is stable. The patient denied having suicidal thoughts for more than 48 hours prior to discharge.  Patient denies having homicidal thoughts.  Patient denies having auditory hallucinations.  Patient denies any visual hallucinations or other symptoms of psychosis. The patient was motivated to continue taking medication with a goal of continued improvement in mental health. The patient reports their target psychiatric symptoms of psychosis, depression, anxiety, responded well to the psychiatric medications, and the patient reports overall benefit other psychiatric hospitalization. Supportive psychotherapy was provided to the patient. The patient also participated in regular group therapy while hospitalized. Coping skills, problem solving as well as relaxation therapies were also part of the unit programming.   Labs were reviewed with the patient, and abnormal results were discussed with the patient.   The patient is able to verbalize their individual safety plan to this provider.   # It is  recommended to the patient to continue psychiatric medications as prescribed, after discharge from the hospital.     #  It is recommended to the patient to follow up with your outpatient psychiatric provider and PCP.   # It was discussed with the patient, the impact of alcohol, drugs, tobacco have been there overall psychiatric and medical wellbeing, and total abstinence from substance use was recommended the patient.ed.   # Prescriptions provided or sent directly to preferred pharmacy at discharge. Patient agreeable to plan. Given opportunity to ask questions. Appears to feel comfortable with discharge.    # In the event of worsening symptoms, the patient is instructed to call the crisis hotline, 988, 911, go to the [facility] health urgent care center, and or go to the nearest ED for appropriate evaluation and treatment of symptoms. To follow-up with primary care provider for other medical issues, concerns and or health care needs   # Patient was discharged with a plan to follow up as noted below.    Total Time spent with patient: 45 minutes   Musculoskeletal: Strength & Muscle Tone: within normal limits Gait & Station: normal Patient leans: N/A   Psychiatric Specialty Exam   Presentation  General Appearance:  Appropriate for Environment   Eye Contact: Fair   Speech: Clear and Coherent   Speech Volume: Normal   Handedness: Right     Mood and Affect  Mood: Euthymic   Duration of Depression Symptoms: No data recorded Affect: Congruent     Thought Process  Thought Processes: Coherent   Descriptions of Associations:Intact   Orientation:Full (Time, Place and Person)   Thought Content:Logical   History of Schizophrenia/Schizoaffective disorder:No data recorded Duration of Psychotic Symptoms:No data recorded Hallucinations:Hallucinations: None   Ideas of Reference:None   Suicidal Thoughts:Suicidal Thoughts: No   Homicidal Thoughts:Homicidal  Thoughts: No     Sensorium  Memory: Immediate Fair   Judgment: Fair   Insight: Fair     Art therapist  Concentration: Fair   Attention Span: Fair   Recall: Fair   Fund of Knowledge: Fair   Language: Fair     Psychomotor Activity  Psychomotor Activity: Psychomotor Activity: Normal   Assets  Assets: Desire for Improvement   Sleep  Sleep: Sleep: Good   Physical Exam: Physical Exam Constitutional:      Appearance: Normal appearance.  Musculoskeletal:     Cervical back: Normal range of motion.  Neurological:     General: No focal deficit present.     Mental Status: She is alert and oriented to person, place, and time.  Psychiatric:        Mood and Affect: Mood normal.        Behavior: Behavior normal.        Thought Content: Thought content normal.        Judgment: Judgment normal.      Review of Systems  Psychiatric/Behavioral:  Positive for depression (Denies SI/HI/AVH, denies plan or intent to harm self or others) and substance abuse (Educated on cessation and on the negative side effects on her mental health). Negative for hallucinations, memory loss and suicidal ideas. The patient is nervous/anxious (stable for management outpatient) and has insomnia (stable).   All other systems reviewed and are negative.   Blood pressure 117/75, pulse 89, temperature 98.4 F (36.9 C), resp. rate 20, height 6\' 1"  (1.854 m), weight 107.5 kg, last menstrual period 02/28/2023, SpO2 99%. Body mass index is 31.27 kg/m.   Mental Status Per Nursing Assessment::   On Admission:  Suicidal ideation indicated by others Denies Suicidal ideation, denies intent or plan to harm self  or others at time of discharge. Demographic Factors:  Female   Loss Factors: Financial problems/change in socioeconomic status   Historical Factors: Family history of mental illness or substance abuse   Risk Reduction Factors:   Positive social support   Continued Clinical Symptoms:   Previous Psychiatric Diagnoses and Treatments   Cognitive Features That Contribute To Risk:  None     Principal Problem: Paranoid schizophrenia (HCC) Discharge Diagnoses: Principal Problem:   Paranoid schizophrenia (HCC) Active Problems:   Psychosis (HCC)   Depression   Anxiety   Delta-9-tetrahydrocannabinol (THC) dependence (HCC)   Tobacco use disorder   Insomnia   Past Psychiatric History: See H & P  Past Medical History:  Past Medical History:  Diagnosis Date   Acquired acanthosis nigricans    Allergies    Asthma    Goiter    Goiter    Overweight(278.02)    Pre-diabetes    Schizophrenia (HCC)    Thyroiditis, autoimmune     Past Surgical History:  Procedure Laterality Date   MOUTH SURGERY     Family History:  Family History  Problem Relation Age of Onset   Obesity Mother    Heart disease Father    Cancer Maternal Grandmother    Diabetes Maternal Grandfather    Family Psychiatric  History: See H & P Social History:  Social History   Substance and Sexual Activity  Alcohol Use Yes   Comment: occasionally     Social History   Substance and Sexual Activity  Drug Use Not Currently   Types: Marijuana    Social History   Socioeconomic History   Marital status: Single    Spouse name: Not on file   Number of children: Not on file   Years of education: Not on file   Highest education level: 12th grade  Occupational History   Not on file  Tobacco Use   Smoking status: Every Day    Current packs/day: 2.00    Types: Cigarettes   Smokeless tobacco: Never  Vaping Use   Vaping status: Never Used  Substance and Sexual Activity   Alcohol use: Yes    Comment: occasionally   Drug use: Not Currently    Types: Marijuana   Sexual activity: Not Currently  Other Topics Concern   Not on file  Social History Narrative   Lives with mother and brother. 9th grade at [employer]. Plays basketball.    Social Drivers of Research scientist (physical sciences) Strain: Not on File (10/14/2021)   Received from [facility]    Financial Resource Strain: 0  Food Insecurity: Not on File (10/07/2022)   Received from [facility] Insecurity    Food: 0  Transportation Needs: Patient Declined (04/03/2023)   PRAPARE - Administrator, Civil Service (Medical): Patient declined    Lack of Transportation (Non-Medical): Patient declined  Physical Activity: Not on File (10/14/2021)   Received from [facility]   Physical Activity    Physical Activity: 0  Stress: Not on File (10/14/2021)   Received from [facility]   Stress    Stress: 0  Social Connections: Not on File (09/20/2022)   Received from [employer]    Connectedness: 0   Physical Findings: AIMS: Facial and Oral Movements Muscles of Facial Expression: Minimal, may be extreme normal Lips and Perioral Area: Minimal, may be extreme normal Jaw: None Tongue: Minimal, may be extreme normal,Extremity Movements  Upper (arms, wrists, hands, fingers): Minimal, may be extreme normal Lower (legs, knees, ankles, toes): None, Trunk Movements Neck, shoulders, hips: None, Global Judgements Severity of abnormal movements overall : Minimal, may be extreme normal Incapacitation due to abnormal movements: None Patient's awareness of abnormal movements: Aware, no distress, Dental Status Current problems with teeth and/or dentures?: No Does patient usually wear dentures?: No [place]?: No  CIWA:    COWS:     Musculoskeletal: Strength & Muscle Tone: within normal limits Gait & Station: normal Patient leans: N/A   Psychiatric Specialty Exam:  Presentation  General Appearance:  Appropriate for Environment  Eye Contact: Fair  Speech: Clear and Coherent  Speech Volume: Normal  Handedness: Right   Mood and Affect  Mood: Euthymic  Affect: Congruent   Thought Process  Thought Processes: Coherent  Descriptions of  Associations:Intact  Orientation:Full (Time, Place and Person)  Thought Content:Logical  History of Schizophrenia/Schizoaffective disorder:No data recorded Duration of Psychotic Symptoms:No data recorded Hallucinations:No data recorded Ideas of Reference:None  Suicidal Thoughts:No data recorded Homicidal Thoughts:No data recorded  Sensorium  Memory: Immediate Fair  Judgment: Fair  Insight: Fair   Art therapist  Concentration: Fair  Attention Span: Fair  Recall: [employer] of Knowledge: Fair  Language: Fair   Psychomotor Activity  Psychomotor Activity:No data recorded  Assets  Assets: Desire for Improvement   Sleep  Sleep:No data recorded   Physical Exam: Physical Exam ROS Blood pressure 122/74, pulse 82, temperature 98.4 F (36.9 C), resp. rate 20, height 6\' 1"  (1.854 m), weight 107.5 kg, last menstrual period 02/28/2023, SpO2 99%. Body mass index is 31.27 kg/m.   Social History   Tobacco Use  Smoking Status Every Day   Current packs/day: 2.00   Types: Cigarettes  Smokeless Tobacco Never   Tobacco Cessation:  A prescription for an FDA-approved tobacco cessation medication provided at discharge   Blood Alcohol level:  Lab Results  Component Value Date   [facility] <10 04/03/2023   ETH <10 06/13/2020    Metabolic Disorder Labs:  Lab Results  Component Value Date   HGBA1C 5.1 04/05/2023   MPG 99.67 04/05/2023   MPG 103 06/13/2020   Lab Results  Component Value Date   PROLACTIN 26.5 (H) 06/13/2020   PROLACTIN 16.6 07/28/2019   Lab Results  Component Value Date   CHOL 163 04/05/2023   TRIG 87 04/05/2023   HDL 38 (L) 04/05/2023   CHOLHDL 4.3 04/05/2023   VLDL 17 04/05/2023   LDLCALC 108 (H) 04/05/2023   LDLCALC 77 06/13/2020    See Psychiatric Specialty Exam and Suicide Risk Assessment completed by Attending Physician prior to discharge.  Discharge destination:  Home  Is patient on multiple antipsychotic therapies at  discharge:  No   Has Patient had three or more failed trials of antipsychotic monotherapy by history:  No  Recommended Plan for Multiple Antipsychotic Therapies: NA   Allergies as of 04/12/2023       Reactions   Peanut-containing Drug Products Anaphylaxis        Medication List     STOP taking these medications    amantadine 100 MG capsule Commonly known as: SYMMETREL   hydrOXYzine 25 MG tablet Commonly known as: ATARAX   risperidone 4 MG tablet Commonly known as: RISPERDAL Replaced by: risperiDONE 3 MG disintegrating tablet   RISPERIDONE ER [place] Replaced by: risperiDONE 1 MG disintegrating tablet       TAKE these medications      Indication  albuterol 108 (90  Base) MCG/ACT inhaler Commonly known as: VENTOLIN HFA Inhale 1-2 puffs into the lungs every 6 (six) hours as needed for wheezing or shortness of breath.  Indication: Asthma   nicotine 21 mg/24hr patch Commonly known as: NICODERM CQ - dosed in mg/24 hours Place 1 patch (21 mg total) onto the skin daily.  Indication: Nicotine Addiction   risperiDONE 3 MG disintegrating tablet Commonly known as: RISPERDAL M-TABS Take 1 tablet (3 mg total) by mouth at bedtime. Replaces: risperidone 4 MG tablet  Indication: Schizophrenia   risperiDONE 1 MG disintegrating tablet Commonly known as: RISPERDAL M-TABS Take 1 tablet (1 mg total) by mouth daily. Replaces: RISPERIDONE ER [place]  Indication: Schizophrenia   traZODone 50 MG tablet Commonly known as: DESYREL Take 1 tablet (50 mg total) by mouth at bedtime as needed for sleep.  Indication: Trouble Sleeping   valbenazine 40 MG capsule Commonly known as: INGREZZA Take 1 capsule (40 mg total) by mouth daily.  Indication: Tardive Dyskinesia   Vitamin D (Ergocalciferol) 1.25 MG (50000 UNIT) Caps capsule Commonly known as: DRISDOL Take 1 capsule (50,000 Units total) by mouth every 7 (seven) days.  Indication: Vitamin D Deficiency        Follow-up Information      [facility] Call.   Why: Please follow up with this provider for ACTT services. Contact information: [address] [place] [address] [phone]                Signed: Starleen Blue, NP 04/18/2023, 8:14 PM

## 2023-07-21 NOTE — Progress Notes (Signed)
 No chief complaint on file.   Heather Kramer 28 year old African-American female presents for acute care concerns related to chest tightness.  She reports a history of tobacco use and marijuana use.  States she had noticed lately that her chest is tight, shortness of breath and is wanting to be screened for lung cancer.  States she has been experiencing the symptoms since the age of 53 when she initially started smoking.  She denies any other concerns related to headache dizziness chest pain.   I just need blood work to make sure cancer.  Denies that she ever been followed by pulmonology.  She denies family history related to emphysema, COPD or other respiratory disorders.  Does have a history of asthma.  Denies that she has had any recent exacerbations.  States she misplaced her inhaler and would like a new one.  -A1c 5.1 and Thyroid  2.23 -7/10-EKG reviewed- qtc 326/451, sinus tachycardia    Subjective Patient ID: Heather Kramer is a 28 year old female who presents for No chief complaint on file.Heather Kramer  Health Maintenance Due  Topic Date Due  . Imm-Varicella (1 of 2 - 13+ 2-dose series) Never done  . Imm-HPV (1 - 3-dose series) Never done  . Imm-Hepatitis B (1 of 3 - 19+ 3-dose series) Never done  . Imm-Pneumococcal (1 of 2 - PCV) Never done  . Imm-COVID-19 (1 - 2024-25 season) Never done   Past Medical History:  Diagnosis Date  . Cannabis abuse    Past Surgical History:  Procedure Laterality Date  . THIRD MOLAR EXTRACTION     Current Outpatient Medications  Medication Sig Dispense Refill  . albuterol  HFA 90 mcg/actuation inhaler Inhale 2 Puffs into the lungs every 6 (six) hours as needed for shortness of breath or wheezing. 1 Each 0  . cholecalciferol, vitamin D3, (VITAMIN D3) 1,250 mcg (50,000 unit) tab tablet Take 1 Tablet by mouth once a week for 90 days 12 Tablet 0  . hydrOXYzine  HCL (ATARAX ) 25 mg tablet Take 25 mg by mouth 3 (three) times daily as needed for anxiety    . paliperidone  palmitate (INVEGA SUSTENNA IM) Inject into the muscle     No current facility-administered medications for this visit.   Allergies  Allergen Reactions  . Peanuts Anaphylaxis  . Grass Pollen   . Mold     Family History  Problem Relation Name Age of Onset  . Hypertension Mother    . Obesity Mother    . Heart attack Father    . Diabetes Mellitus II Maternal Aunt    . Diabetes Mellitus II Maternal Grandmother      Social History   Tobacco Use  . Smoking status: Every Day    Types: Cigarettes, Cigars    Start date: 2016  . Smokeless tobacco: Never  . Tobacco comments:    Smokes 3 Black and mild cigars per day since age 46 years old  Vaping Use  . Vaping status: Never Used  Substance and Sexual Activity  . Alcohol use: Never  . Drug use: Not Currently    Types: Marijuana    Comment: Quit 1 year ago  . Sexual activity: Not Currently   Social Drivers of Health   Financial Resource Strain: Not on File (10/14/2021)   Financial Resource Strain   . Financial Resource Strain: 0  Food Insecurity: Not on File (10/07/2022)   Food Insecurity   . Food: 0  Transportation Needs: Patient Declined (04/03/2023)   Received [facility]  PRAPARE - Transportation   . Lack of Transportation (Medical): Patient declined   . Lack of Transportation (Non-Medical): Patient declined  Physical Activity: Not on File (10/14/2021)   Physical Activity   . Physical Activity: 0  Stress: Not on File (10/14/2021)   Stress   . Stress: 0  Social Connections: Not on File (09/20/2022)   Social Connections   . Connectedness: 0  Housing Stability: Patient Declined (04/04/2023)   Received from [facility] Stability Vital Sign   . Unable to Pay for Housing in the Last Year: Patient declined   . Number of Times Moved in the Last Year: 0   . Homeless in the Last Year: Patient declined     Review of Systems  Respiratory:  Positive for chest tightness and shortness of breath.     Objective LMP  04/18/2022 (Approximate)  OB Status Having periods  Smoking Status Every Day  No results found for this visit on 07/21/23.  Physical Exam Vitals and nursing note reviewed. Exam conducted with a chaperone present.   Constitutional:      Appearance: She has obesity  Cardiovascular:     Rate and Rhythm: Normal rate and regular rhythm.  Pulmonary:     Effort: Pulmonary effort is normal.     Breath sounds: Normal breath sounds.  Skin:    General: Skin is warm.  Neurological:     Mental Status: She is alert.  Psychiatric:        Mood and Affect: Mood normal.        Behavior: Behavior normal.     Comments: Malodorous     Assessment & Plan   Diagnoses and all orders for this visit: Chest tightness -     ECG ROUTINE ECG W/LEAST 12 LDS W/I&R -     COMPREHENSIVE METABOLIC PANEL Routine -     BLOOD COUNT COMPLETE AUTO&AUTO DIFRNTL WBC Routine Musculoskeletal chest pain -     XR - CHEST - 2 VIEWS (PA AND LATERAL) Other orders -     albuterol  HFA 90 mcg/actuation inhaler; Inhale 2 Puffs into the lungs every 6 (six) hours as needed for shortness of breath or wheezing.

## 2023-07-22 ENCOUNTER — Other Ambulatory Visit: Payer: Self-pay | Admitting: Family

## 2023-07-22 ENCOUNTER — Ambulatory Visit
Admission: RE | Admit: 2023-07-22 | Discharge: 2023-07-22 | Disposition: A | Discharge status: Home / Self Care | Source: Ambulatory Visit | Attending: Family | Admitting: Family

## 2023-07-22 DIAGNOSIS — I259 Chronic ischemic heart disease, unspecified: Secondary | ICD-10-CM

## 2023-07-23 ENCOUNTER — Ambulatory Visit (HOSPITAL_COMMUNITY)
Admission: EM | Admit: 2023-07-23 | Discharge: 2023-07-24 | Disposition: A | Discharge status: Other Acute Inpatient Hospital | Attending: Nurse Practitioner | Admitting: Nurse Practitioner

## 2023-07-23 DIAGNOSIS — F250 Schizoaffective disorder, bipolar type: Secondary | ICD-10-CM | POA: Diagnosis not present

## 2023-07-23 MED ORDER — NICOTINE 21 MG/24HR TD PT24
21.0000 mg | MEDICATED_PATCH | Freq: Every day | TRANSDERMAL | Status: DC
  Administered 2023-07-24: 21 mg via TRANSDERMAL
  Filled 2023-07-23: qty 1

## 2023-07-23 MED ORDER — ACETAMINOPHEN 325 MG PO TABS: 650.0000 mg | ORAL_TABLET | Freq: Four times a day (QID) | ORAL | Status: DC | PRN

## 2023-07-23 MED ORDER — RISPERIDONE 2 MG PO TBDP: 4.0000 mg | ORAL_TABLET | Freq: Every day | ORAL | Status: DC

## 2023-07-23 MED ORDER — TRAZODONE HCL 100 MG PO TABS
100.0000 mg | ORAL_TABLET | Freq: Every evening | ORAL | Status: DC | PRN
  Administered 2023-07-23: 100 mg via ORAL
  Filled 2023-07-23: qty 1

## 2023-07-23 MED ORDER — MAGNESIUM HYDROXIDE 400 MG/5ML PO SUSP: 30.0000 mL | Freq: Every day | ORAL | Status: DC | PRN

## 2023-07-23 MED ORDER — ALBUTEROL SULFATE HFA 108 (90 BASE) MCG/ACT IN AERS: 1.0000 | INHALATION_SPRAY | Freq: Four times a day (QID) | RESPIRATORY_TRACT | Status: DC | PRN

## 2023-07-23 MED ORDER — HALOPERIDOL LACTATE 5 MG/ML IJ SOLN: 10.0000 mg | Freq: Three times a day (TID) | INTRAMUSCULAR | Status: DC | PRN

## 2023-07-23 MED ORDER — DIPHENHYDRAMINE HCL 50 MG/ML IJ SOLN: 50.0000 mg | Freq: Three times a day (TID) | INTRAMUSCULAR | Status: DC | PRN

## 2023-07-23 MED ORDER — ALUM & MAG HYDROXIDE-SIMETH 200-200-20 MG/5ML PO SUSP: 30.0000 mL | ORAL | Status: DC | PRN

## 2023-07-23 MED ORDER — DIPHENHYDRAMINE HCL 50 MG PO CAPS
50.0000 mg | ORAL_CAPSULE | Freq: Three times a day (TID) | ORAL | Status: DC | PRN
  Administered 2023-07-24: 50 mg via ORAL
  Filled 2023-07-23: qty 1

## 2023-07-23 MED ORDER — HALOPERIDOL 5 MG PO TABS
5.0000 mg | ORAL_TABLET | Freq: Three times a day (TID) | ORAL | Status: DC | PRN
  Administered 2023-07-24: 5 mg via ORAL
  Filled 2023-07-23: qty 1

## 2023-07-23 MED ORDER — HALOPERIDOL LACTATE 5 MG/ML IJ SOLN: 5.0000 mg | Freq: Three times a day (TID) | INTRAMUSCULAR | Status: DC | PRN

## 2023-07-23 MED ORDER — HYDROXYZINE HCL 25 MG PO TABS: 25.0000 mg | ORAL_TABLET | Freq: Three times a day (TID) | ORAL | Status: DC | PRN

## 2023-07-23 MED ORDER — LORAZEPAM 2 MG/ML IJ SOLN: 2.0000 mg | Freq: Three times a day (TID) | INTRAMUSCULAR | Status: DC | PRN

## 2023-07-23 MED ORDER — VALBENAZINE TOSYLATE 40 MG PO CAPS
40.0000 mg | ORAL_CAPSULE | Freq: Every day | ORAL | Status: DC
  Administered 2023-07-24: 40 mg via ORAL
  Filled 2023-07-23: qty 1

## 2023-07-23 MED ORDER — RISPERIDONE 1 MG PO TBDP
1.0000 mg | ORAL_TABLET | Freq: Every day | ORAL | Status: DC
  Administered 2023-07-23 – 2023-07-24 (×2): 1 mg via ORAL
  Filled 2023-07-23 (×2): qty 1

## 2023-07-23 NOTE — ED Notes (Signed)
 Pt was cooperative and calm during this assessment. Pt said she came in because she has been experiencing auditory hallucinations. Pt said the voices have been telling her that she will die. Pt also admitted to visual hallucinations, denied SI/HI. She was observed responding to internal stimuli from time to time during this assessment process. Pt said she uses marijuana. She said she is not able to give a urine specimen at this time, asked staff if she could do it later. Pt's skin assessment was done and a small open area was noted on her upper left arm, the size of a grain of rice. She said it was a bump and I scratched it. Calluses were noted under both of her feet. She denied pain. Pt was searched, oriented to the unit and her belongings secured per unit protocol. She was given a salad and juice per her request. Staff will monitor pt for safety.

## 2023-07-23 NOTE — Progress Notes (Signed)
   07/23/23 2130  [facility] Triage Screening (Walk-ins at [facility] only)  How Did You Hear About Us ? Self  What Is the Reason for Your Visit/Call Today? Heather Kramer arrived to the [facility] with concerns of auditory hallucinations and her schizophrenia she says the voices are saying she is going to die and seeing things in reverse such as cars and images of people. She states that she has been having auditory hallucinations since she was 23 and visual on and off. She is currently medicated and compliant with her medication. She currently takes risperidone , trazadone, albuterol , and ingrezza . She denies SI and Substance use but endorses AVH and HI statiignthat she feels like she genuinely wants to fight anybody, (this was said in a calm demeanor)  How Long Has This Been Causing You Problems? 1 wk - 1 month  Have You Recently Had Any Thoughts About Hurting Yourself? No  Are You Planning to Commit Suicide/Harm Yourself At This time? No  Have you Recently Had Thoughts About Hurting Someone Sherral? Yes  How long ago did you have thoughts of harming others? Plans on hurting anybody, just feeling like she wants to genuiely fight  Are You Planning To Harm Someone At This Time? No  Verbal Abuse Yes, past (Comment);Yes, present (Comment)  Sexual Abuse Denies  Exploitation of patient/patient's resources Denies  Self-Neglect Denies  Are you currently experiencing any auditory, visual or other hallucinations? Yes  Please explain the hallucinations you are currently experiencing: Hearing voices saying that she is going to die and seeing images of people and cars going in reverse.  Have You Used Any Alcohol or Drugs in the Past 24 Hours? No  Do you have any current medical co-morbidities that require immediate attention? No  Clinician description of patient physical appearance/behavior: Short of breath, fatigued, anxious, jittery  What Do You Feel Would Help You the Most Today? Treatment for Depression or other mood  problem;Medication(s);Social Support;Stress Management  If access to [facility] Urgent Care was not available, would you have sought care in the Emergency Department? Yes  Determination of Need Urgent (48 hours)  Options For Referral [facility] Urgent Care;Other: Comment;Medication Management;Therapeutic Triage Services  Determination of Need filed? Yes
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
## 2023-07-24 ENCOUNTER — Encounter (HOSPITAL_COMMUNITY): Payer: Self-pay | Admitting: Psychiatry

## 2023-07-24 ENCOUNTER — Inpatient Hospital Stay (HOSPITAL_COMMUNITY)
Admission: AD | Admit: 2023-07-24 | Discharge: 2023-07-30 | DRG: 885 | Disposition: A | Discharge status: Home / Self Care | Source: Intra-hospital | Attending: Psychiatry | Admitting: Psychiatry

## 2023-07-24 ENCOUNTER — Other Ambulatory Visit: Payer: Self-pay

## 2023-07-24 DIAGNOSIS — G4700 Insomnia, unspecified: Secondary | ICD-10-CM | POA: Diagnosis present

## 2023-07-24 DIAGNOSIS — Z8249 Family history of ischemic heart disease and other diseases of the circulatory system: Secondary | ICD-10-CM | POA: Diagnosis not present

## 2023-07-24 DIAGNOSIS — E669 Obesity, unspecified: Secondary | ICD-10-CM | POA: Diagnosis present

## 2023-07-24 DIAGNOSIS — F251 Schizoaffective disorder, depressive type: Secondary | ICD-10-CM | POA: Diagnosis not present

## 2023-07-24 DIAGNOSIS — E063 Autoimmune thyroiditis: Secondary | ICD-10-CM | POA: Diagnosis present

## 2023-07-24 DIAGNOSIS — J45909 Unspecified asthma, uncomplicated: Secondary | ICD-10-CM | POA: Diagnosis present

## 2023-07-24 DIAGNOSIS — F259 Schizoaffective disorder, unspecified: Secondary | ICD-10-CM | POA: Diagnosis present

## 2023-07-24 DIAGNOSIS — Z6830 Body mass index (BMI) 30.0-30.9, adult: Secondary | ICD-10-CM

## 2023-07-24 DIAGNOSIS — F1210 Cannabis abuse, uncomplicated: Secondary | ICD-10-CM | POA: Diagnosis present

## 2023-07-24 DIAGNOSIS — Z79899 Other long term (current) drug therapy: Secondary | ICD-10-CM

## 2023-07-24 DIAGNOSIS — F17210 Nicotine dependence, cigarettes, uncomplicated: Secondary | ICD-10-CM | POA: Diagnosis present

## 2023-07-24 DIAGNOSIS — Z833 Family history of diabetes mellitus: Secondary | ICD-10-CM | POA: Diagnosis not present

## 2023-07-24 DIAGNOSIS — F250 Schizoaffective disorder, bipolar type: Secondary | ICD-10-CM | POA: Diagnosis not present

## 2023-07-24 DIAGNOSIS — F200 Paranoid schizophrenia: Secondary | ICD-10-CM | POA: Diagnosis present

## 2023-07-24 DIAGNOSIS — Z560 Unemployment, unspecified: Secondary | ICD-10-CM

## 2023-07-24 DIAGNOSIS — Z91010 Allergy to peanuts: Secondary | ICD-10-CM | POA: Diagnosis not present

## 2023-07-24 LAB — CBC WITH DIFFERENTIAL/PLATELET
Abs Immature Granulocytes: 0.02 K/uL (ref 0.00–0.07)
Basophils Absolute: 0 K/uL (ref 0.0–0.1)
Basophils Relative: 0 %
Eosinophils Absolute: 0.2 K/uL (ref 0.0–0.5)
Eosinophils Relative: 2 %
HCT: 46.8 % — ABNORMAL HIGH (ref 36.0–46.0)
Hemoglobin: 16 g/dL — ABNORMAL HIGH (ref 12.0–15.0)
Immature Granulocytes: 0 %
Lymphocytes Relative: 38 %
Lymphs Abs: 2.9 K/uL (ref 0.7–4.0)
MCH: 29.5 pg (ref 26.0–34.0)
MCHC: 34.2 g/dL (ref 30.0–36.0)
MCV: 86.3 fL (ref 80.0–100.0)
Monocytes Absolute: 0.7 K/uL (ref 0.1–1.0)
Monocytes Relative: 10 %
Neutro Abs: 3.7 K/uL (ref 1.7–7.7)
Neutrophils Relative %: 50 %
Platelets: 367 K/uL (ref 150–400)
RBC: 5.42 MIL/uL — ABNORMAL HIGH (ref 3.87–5.11)
RDW: 12.7 % (ref 11.5–15.5)
WBC: 7.5 K/uL (ref 4.0–10.5)
nRBC: 0 % (ref 0.0–0.2)

## 2023-07-24 LAB — POCT URINE DRUG SCREEN - MANUAL ENTRY (I-SCREEN)
POC Amphetamine UR: NOT DETECTED
POC Buprenorphine (BUP): NOT DETECTED
POC Cocaine UR: NOT DETECTED
POC Marijuana UR: POSITIVE — AB
POC Methadone UR: NOT DETECTED
POC Methamphetamine UR: NOT DETECTED
POC Morphine: NOT DETECTED
POC Oxazepam (BZO): NOT DETECTED
POC Oxycodone UR: NOT DETECTED
POC Secobarbital (BAR): NOT DETECTED

## 2023-07-24 LAB — COMPREHENSIVE METABOLIC PANEL WITH GFR
ALT: 14 U/L (ref 0–44)
AST: 19 U/L (ref 15–41)
Albumin: 4.2 g/dL (ref 3.5–5.0)
Alkaline Phosphatase: 69 U/L (ref 38–126)
Anion gap: 10 (ref 5–15)
BUN: 7 mg/dL (ref 6–20)
CO2: 23 mmol/L (ref 22–32)
Calcium: 10 mg/dL (ref 8.9–10.3)
Chloride: 103 mmol/L (ref 98–111)
Creatinine, Ser: 1.09 mg/dL — ABNORMAL HIGH (ref 0.44–1.00)
GFR, Estimated: >60 mL/min (ref >60)
Glucose, Bld: 96 mg/dL (ref 70–99)
Potassium: 3.9 mmol/L (ref 3.5–5.1)
Sodium: 136 mmol/L (ref 135–145)
Total Bilirubin: 0.5 mg/dL (ref 0.0–1.2)
Total Protein: 7.7 g/dL (ref 6.5–8.1)

## 2023-07-24 LAB — ETHANOL: Alcohol, Ethyl (B): <15 mg/dL (ref <15)

## 2023-07-24 LAB — LIPID PANEL
Cholesterol: 156 mg/dL (ref 0–200)
HDL: 31 mg/dL — ABNORMAL LOW (ref >40)
LDL Cholesterol: 109 mg/dL — ABNORMAL HIGH (ref 0–99)
Total CHOL/HDL Ratio: 5 ratio
Triglycerides: 78 mg/dL (ref <150)
VLDL: 16 mg/dL (ref 0–40)

## 2023-07-24 LAB — MAGNESIUM: Magnesium: 2.1 mg/dL (ref 1.7–2.4)

## 2023-07-24 LAB — POC URINE PREG, ED: Preg Test, Ur: NEGATIVE

## 2023-07-24 MED ORDER — HYDROXYZINE HCL 25 MG PO TABS
25.0000 mg | ORAL_TABLET | Freq: Three times a day (TID) | ORAL | Status: DC | PRN
  Administered 2023-07-25 – 2023-07-28 (×2): 25 mg via ORAL
  Filled 2023-07-24 (×3): qty 1

## 2023-07-24 MED ORDER — RISPERIDONE 1 MG PO TBDP
1.0000 mg | ORAL_TABLET | Freq: Every day | ORAL | Status: DC
  Administered 2023-07-25: 1 mg via ORAL
  Filled 2023-07-24: qty 1

## 2023-07-24 MED ORDER — ALBUTEROL SULFATE HFA 108 (90 BASE) MCG/ACT IN AERS: 1.0000 | INHALATION_SPRAY | Freq: Four times a day (QID) | RESPIRATORY_TRACT | Status: DC | PRN

## 2023-07-24 MED ORDER — HALOPERIDOL LACTATE 5 MG/ML IJ SOLN: 5.0000 mg | Freq: Three times a day (TID) | INTRAMUSCULAR | Status: DC | PRN

## 2023-07-24 MED ORDER — ACETAMINOPHEN 325 MG PO TABS: 650.0000 mg | ORAL_TABLET | Freq: Four times a day (QID) | ORAL | Status: DC | PRN

## 2023-07-24 MED ORDER — HALOPERIDOL 5 MG PO TABS
5.0000 mg | ORAL_TABLET | Freq: Three times a day (TID) | ORAL | Status: DC | PRN
  Administered 2023-07-26 (×2): 5 mg via ORAL
  Filled 2023-07-24 (×2): qty 1

## 2023-07-24 MED ORDER — ALUM & MAG HYDROXIDE-SIMETH 200-200-20 MG/5ML PO SUSP: 30.0000 mL | ORAL | Status: DC | PRN

## 2023-07-24 MED ORDER — DIPHENHYDRAMINE HCL 50 MG/ML IJ SOLN: 50.0000 mg | Freq: Three times a day (TID) | INTRAMUSCULAR | Status: DC | PRN

## 2023-07-24 MED ORDER — RISPERIDONE 2 MG PO TBDP
4.0000 mg | ORAL_TABLET | Freq: Every day | ORAL | Status: DC
  Administered 2023-07-24: 4 mg via ORAL
  Filled 2023-07-24: qty 2

## 2023-07-24 MED ORDER — VALBENAZINE TOSYLATE 40 MG PO CAPS
40.0000 mg | ORAL_CAPSULE | Freq: Every day | ORAL | Status: DC
  Administered 2023-07-25 – 2023-07-30 (×6): 40 mg via ORAL
  Filled 2023-07-24 (×6): qty 1

## 2023-07-24 MED ORDER — NICOTINE 14 MG/24HR TD PT24
14.0000 mg | MEDICATED_PATCH | Freq: Every day | TRANSDERMAL | Status: DC
  Administered 2023-07-24: 14 mg via TRANSDERMAL
  Filled 2023-07-24: qty 1

## 2023-07-24 MED ORDER — DIPHENHYDRAMINE HCL 25 MG PO CAPS
50.0000 mg | ORAL_CAPSULE | Freq: Three times a day (TID) | ORAL | Status: DC | PRN
  Administered 2023-07-26 (×2): 50 mg via ORAL
  Filled 2023-07-24 (×2): qty 2

## 2023-07-24 MED ORDER — LORAZEPAM 2 MG/ML IJ SOLN: 2.0000 mg | Freq: Three times a day (TID) | INTRAMUSCULAR | Status: DC | PRN

## 2023-07-24 MED ORDER — MAGNESIUM HYDROXIDE 400 MG/5ML PO SUSP: 30.0000 mL | Freq: Every day | ORAL | Status: DC | PRN

## 2023-07-24 MED ORDER — NICOTINE 21 MG/24HR TD PT24
21.0000 mg | MEDICATED_PATCH | Freq: Every day | TRANSDERMAL | Status: DC
  Administered 2023-07-25 – 2023-07-30 (×6): 21 mg via TRANSDERMAL
  Filled 2023-07-24 (×6): qty 1

## 2023-07-24 MED ORDER — HALOPERIDOL LACTATE 5 MG/ML IJ SOLN: 10.0000 mg | Freq: Three times a day (TID) | INTRAMUSCULAR | Status: DC | PRN

## 2023-07-24 MED ORDER — TRAZODONE HCL 50 MG PO TABS
50.0000 mg | ORAL_TABLET | Freq: Every evening | ORAL | Status: DC | PRN
  Administered 2023-07-24 – 2023-07-29 (×6): 50 mg via ORAL
  Filled 2023-07-24 (×6): qty 1

## 2023-07-24 NOTE — ED Notes (Signed)
 Patient sitting in lounger. Patient had a verbal outburst after speaking on the phone, staff spoke with patient and patient is now calm and composed. Patient in no apparent acute distress. Environment secured. Safety checks in place per facility protocol.

## 2023-07-24 NOTE — [facility] Group Notes (Signed)
 Psychoeducational Group Note  Date:  07/24/2023 Time:  2000  Group Topic/Focus:  Wrap up group  Participation Level: Did Not Attend  Participation Quality:  Not Applicable  Affect:  Not Applicable  Cognitive:  Not Applicable  Insight:  Not Applicable  Engagement in Group: Not Applicable  Additional Comments:  Did not attend.   Lenora Manuelita RAMAN 07/24/2023, 8:42 PM

## 2023-07-24 NOTE — ED Provider Notes (Signed)
 FBC/OBS ASAP Discharge Summary  Date and Time: 07/24/2023 10:55 AM  Name: Heather Kramer  MRN:  [id number]   Discharge Diagnoses:  Final diagnoses:  Schizoaffective disorder, bipolar type [facility])    Subjective: Heather Kramer 28 y.o., female patient presented to [facility] as a voluntary walk in with complaints of worsening auditory and visual hallucinations.Heather Kramer, is seen face to face by this provider, consulted with Dr. Leigh; and chart reviewed on 07/24/23.  Per chart review patient has past psychiatric history of schizoaffective disorder.  She was last inpatient at [facility] in March 2025 with similar presentation. Pt currently follows-up with [facility].   On evaluation [name] reports that she is still heating voices telling her that she is going to die or should die. She states that the voices are making her very upset and annoying her and just wants them to stop. She is observed responding to internal stimuli in an angry/ frustrated manner. She was given PRN PO Haldol /Benadryl  to help with psychotic symptoms and mild agitation and is appreciative of. She is also still reporting VH of ghosts and dead people but denies seeing them throughout our assessment. She does inquire about when she will be transferred to [facility] and states thank God it will be today. She feels eager to get there bc it was helpful last time she was admitted at [facility]. She reports sleeping better last night and has a good appetite. She denies current SI and HI.  Patient denies feeling as though she is depressed but does endorse having depressive symptoms including anhedonia, poor sleep, feelings of hopelessness and worthlessness and decreased energy/motivation. She has been compliant with medications and is agreeable to receiving inpatient hospitalization. She denies having any acute pain or physical complaints.   During evaluation Letesha A Ortman is laying down in bed, in no acute distress.  She is  alert & oriented x 4, calm, cooperative but somewhat preoccupied for this assessment.  Her mood is dysphoric and irritable with congruent/ flat affect.  She has normal speech for the most, part does display some thought blocking when AH are present.  Pt does appear to be responding to internal or external stimuli as observed by her talking to self in a frustrated manner.  Patient is able to converse coherently, goal directed thoughts and some preoccupation. She denies current suicidal/self-harm/homicidal ideation. Continues to endorse AH and VH. Patient answered question appropriately.    Stay Summary: 07/23/23  Roxianne Olp, NP        Total Time spent with patient: 15 minutes  Past Psychiatric History: Schizoaffective disorder, [facility] March 2025. Past Medical History: History of asthma, goiter, autoimmune thyroiditis and prediabetes.  Family History:  Family History  Problem Relation Age of Onset   Obesity Mother    Heart disease Father    Cancer Maternal Grandmother    Diabetes Maternal Grandfather     Family Psychiatric History: None reported Social History: Single female that is unemployed and lives in alone in a boarding house.  Patient endorses marijuana use.  Patient's mother is still involved in her care and helps manage her medications -Heather Kramer [phone].  Tobacco Cessation:  A prescription for an FDA-approved tobacco cessation medication provided at discharge  Current Medications:  Current Facility-Administered Medications  Medication Dose Route Frequency Provider Last Rate Last Admin   acetaminophen  (TYLENOL ) tablet 650 mg  650 mg Oral Q6H PRN Bobbitt, Shalon E, NP  albuterol  (VENTOLIN  HFA) 108 (90 Base) MCG/ACT inhaler 1-2 puff  1-2 puff Inhalation Q6H PRN Bobbitt, Shalon E, NP       alum & mag hydroxide-simeth (MAALOX/MYLANTA) 200-200-20 MG/5ML suspension 30 mL  30 mL Oral Q4H PRN Bobbitt, Shalon E, NP       haloperidol  (HALDOL ) tablet 5 mg  5 mg Oral TID  PRN Bobbitt, Shalon E, NP   5 mg at 07/24/23 1034   And   diphenhydrAMINE  (BENADRYL ) capsule 50 mg  50 mg Oral TID PRN Bobbitt, Shalon E, NP   50 mg at 07/24/23 1034   haloperidol  lactate (HALDOL ) injection 5 mg  5 mg Intramuscular TID PRN Bobbitt, Shalon E, NP       And   diphenhydrAMINE  (BENADRYL ) injection 50 mg  50 mg Intramuscular TID PRN Bobbitt, Shalon E, NP       And   LORazepam  (ATIVAN ) injection 2 mg  2 mg Intramuscular TID PRN Bobbitt, Shalon E, NP       haloperidol  lactate (HALDOL ) injection 10 mg  10 mg Intramuscular TID PRN Bobbitt, Shalon E, NP       And   diphenhydrAMINE  (BENADRYL ) injection 50 mg  50 mg Intramuscular TID PRN Bobbitt, Shalon E, NP       And   LORazepam  (ATIVAN ) injection 2 mg  2 mg Intramuscular TID PRN Bobbitt, Shalon E, NP       hydrOXYzine  (ATARAX ) tablet 25 mg  25 mg Oral TID PRN Bobbitt, Shalon E, NP       magnesium  hydroxide (MILK OF MAGNESIA) suspension 30 mL  30 mL Oral Daily PRN Bobbitt, Shalon E, NP       nicotine  (NICODERM CQ  - dosed in mg/24 hours) patch 21 mg  21 mg Transdermal Daily Bobbitt, Shalon E, NP   21 mg at 07/24/23 9081   risperiDONE  (RISPERDAL  M-TABS) disintegrating tablet 1 mg  1 mg Oral Daily Bobbitt, Shalon E, NP   1 mg at 07/24/23 9083   risperiDONE  (RISPERDAL  M-TABS) disintegrating tablet 4 mg  4 mg Oral QHS Bobbitt, Shalon E, NP       traZODone  (DESYREL ) tablet 100 mg  100 mg Oral QHS PRN Bobbitt, Shalon E, NP   100 mg at 07/23/23 2312   valbenazine  (INGREZZA ) capsule 40 mg  40 mg Oral Daily Bobbitt, Shalon E, NP   40 mg at 07/24/23 9081   Current Outpatient Medications  Medication Sig Dispense Refill   albuterol  (VENTOLIN  HFA) 108 (90 Base) MCG/ACT inhaler Inhale 1-2 puffs into the lungs every 6 (six) hours as needed for wheezing or shortness of breath. 1 each 0   nicotine  (NICODERM CQ  - DOSED IN MG/24 HOURS) 21 mg/24hr patch Place 1 patch (21 mg total) onto the skin daily. 28 patch 0   risperiDONE  (RISPERDAL  M-TABS) 1 MG  disintegrating tablet Take 1 tablet (1 mg total) by mouth daily. 30 tablet 0   risperiDONE  (RISPERDAL  M-TABS) 3 MG disintegrating tablet Take 1 tablet (3 mg total) by mouth at bedtime. 30 tablet 0   traZODone  (DESYREL ) 50 MG tablet Take 1 tablet (50 mg total) by mouth at bedtime as needed for sleep. 30 tablet 0   valbenazine  (INGREZZA ) 40 MG capsule Take 1 capsule (40 mg total) by mouth daily. 30 capsule 0   Vitamin D , Ergocalciferol , (DRISDOL ) 1.25 MG (50000 UNIT) CAPS capsule Take 1 capsule (50,000 Units total) by mouth every 7 (seven) days. 5 capsule 0    PTA Medications:  Facility Ordered Medications  Medication   acetaminophen  (TYLENOL ) tablet 650 mg   alum & mag hydroxide-simeth (MAALOX/MYLANTA) 200-200-20 MG/5ML suspension 30 mL   magnesium  hydroxide (MILK OF MAGNESIA) suspension 30 mL   haloperidol  (HALDOL ) tablet 5 mg   And   diphenhydrAMINE  (BENADRYL ) capsule 50 mg   haloperidol  lactate (HALDOL ) injection 5 mg   And   diphenhydrAMINE  (BENADRYL ) injection 50 mg   And   LORazepam  (ATIVAN ) injection 2 mg   haloperidol  lactate (HALDOL ) injection 10 mg   And   diphenhydrAMINE  (BENADRYL ) injection 50 mg   And   LORazepam  (ATIVAN ) injection 2 mg   hydrOXYzine  (ATARAX ) tablet 25 mg   albuterol  (VENTOLIN  HFA) 108 (90 Base) MCG/ACT inhaler 1-2 puff   nicotine  (NICODERM CQ  - dosed in mg/24 hours) patch 21 mg   risperiDONE  (RISPERDAL  M-TABS) disintegrating tablet 1 mg   valbenazine  (INGREZZA ) capsule 40 mg   traZODone  (DESYREL ) tablet 100 mg   risperiDONE  (RISPERDAL  M-TABS) disintegrating tablet 4 mg   PTA Medications  Medication Sig   albuterol  (VENTOLIN  HFA) 108 (90 Base) MCG/ACT inhaler Inhale 1-2 puffs into the lungs every 6 (six) hours as needed for wheezing or shortness of breath.   nicotine  (NICODERM CQ  - DOSED IN MG/24 HOURS) 21 mg/24hr patch Place 1 patch (21 mg total) onto the skin daily.   traZODone  (DESYREL ) 50 MG tablet Take 1 tablet (50 mg total) by mouth at bedtime  as needed for sleep.   valbenazine  (INGREZZA ) 40 MG capsule Take 1 capsule (40 mg total) by mouth daily.   Vitamin D , Ergocalciferol , (DRISDOL ) 1.25 MG (50000 UNIT) CAPS capsule Take 1 capsule (50,000 Units total) by mouth every 7 (seven) days.   risperiDONE  (RISPERDAL  M-TABS) 1 MG disintegrating tablet Take 1 tablet (1 mg total) by mouth daily.   risperiDONE  (RISPERDAL  M-TABS) 3 MG disintegrating tablet Take 1 tablet (3 mg total) by mouth at bedtime.        No data to display          Flowsheet Row ED from 07/23/2023 in [facility] Most recent reading at 07/23/2023 11:39 PM Ad[facility] (Discharged[facility] Most recent reading at 04/03/2023 11:27 PM ED from 04/03/2023 in [facility] Emergency Department at [facility] Most recent reading at 04/03/2023  7:48 AM  C-SSRS RISK CATEGORY No Risk Low Risk Low Risk    Musculoskeletal  Strength & Muscle Tone: within normal limits Gait & Station: normal Patient leans: N/A  Psychiatric Specialty Exam  Presentation  General Appearance:  Fairly Groomed  Eye Contact: Fair  Speech: Clear and Coherent; Blocked  Speech Volume: Normal  Handedness: Right   Mood and Affect  Mood: Dysphoric; Irritable  Affect: Congruent; Flat   Thought Process  Thought Processes: Coherent  Descriptions of Associations:Intact  Orientation:Full (Time, Place and Person)  Thought Content:WDL  Diagnosis of Schizophrenia or Schizoaffective disorder in past: Yes  Duration of Psychotic Symptoms: Greater than six months   Hallucinations:Hallucinations: None; Auditory Description of Auditory Hallucinations: A female saying she is going to die  Ideas of Reference:None  Suicidal Thoughts:Suicidal Thoughts: No  Homicidal Thoughts:Homicidal Thoughts: No   Sensorium  Memory: Recent Fair; Immediate Fair  Judgment: Fair  Insight: Fair   Restaurant manager, fast food  Concentration: Fair  Attention Span: Fair  Recall: [employer] of Knowledge: Fair  Language: Fair   Psychomotor Activity  Psychomotor Activity: Psychomotor Activity: Normal   Assets  Assets: Communication Skills; Desire for Improvement;  Housing; Physical Health; Resilience; Social Support   Sleep  Sleep: Sleep: Engineer, materials Durations: No data on file for Sleeping  Nutritional Assessment (For OBS and FBC admissions only) Has the patient had a weight loss or gain of 10 pounds or more in the last 3 months?: No Has the patient had a decrease in food intake/or appetite?: No Does the patient have dental problems?: No Does the patient have eating habits or behaviors that may be indicators of an eating disorder including binging or inducing vomiting?: No Has the patient recently lost weight without trying?: 0 Has the patient been eating poorly because of a decreased appetite?: 0 Malnutrition Screening Tool Score: 0    Physical Exam  Physical Exam Vitals and nursing note reviewed.  Constitutional:      Appearance: Normal appearance.  HENT:     Head: Normocephalic.     Nose: Nose normal.  Eyes:     Extraocular Movements: Extraocular movements intact.  Cardiovascular:     Rate and Rhythm: Normal rate.  Pulmonary:     Effort: Pulmonary effort is normal.  Musculoskeletal:        General: Normal range of motion.     Cervical back: Normal range of motion.  Neurological:     General: No focal deficit present.     Mental Status: She is alert and oriented to person, place, and time.    Review of Systems  Constitutional: Negative.   HENT: Negative.    Eyes: Negative.   Respiratory: Negative.    Cardiovascular: Negative.   Gastrointestinal: Negative.   Genitourinary: Negative.   Musculoskeletal: Negative.   Neurological: Negative.   Endo/Heme/Allergies: Negative.   Psychiatric/Behavioral:  Positive for depression and hallucinations.     Blood pressure 118/60, pulse 100, temperature 98 F (36.7 C), temperature source Oral, resp. rate 17, SpO2 100%. There is no height or weight on file to calculate BMI.   Plan Of Care/Follow-up recommendations:  Based on evaluation today, patient is still recommended for inpatient psychiatric hospitalization for acute psychosis stabilization. No changes to current medication regimen. Patient is currently voluntary and agrees to this treatment plan.   Disposition: Patient accepted to [facility].  Alan JAYSON Mcardle, NP 07/24/2023, 10:55 AM

## 2023-07-24 NOTE — ED Notes (Signed)
 Patient transferred to [facility]. Patient discharged in no acute distress, A& O x4 and ambulatory. Patient endorsed AVH upon transfer. Patient verbalized understanding of admission, no questions at this time. Patient mood fair. Patient belongings returned to patient from locker #23 complete and intact. Patient escorted to back [place] via staff for transport to destination. Safety maintained.

## 2023-07-24 NOTE — Discharge Instructions (Signed)
 Patient transferred to [facility] for inpatient treatment.

## 2023-07-24 NOTE — ED Notes (Addendum)
 Patient alert & oriented x4. Denies intent to harm self or others when asked. Endorses A/VH. Patient reports CAH of voices telling her to shut up. Patient did not further elaborate on hallucinations to writer other than endorsing their presence. Per MHT who spoke with patient prior to RN assessment patient further reported VH of people she shouldn't be seeing.  Patient denies any physical complaints when asked. No acute distress noted. Support and encouragement provided. Patient observed in milieu. Patient had one verbal outburts after speaking to someone on the phone but was able to be redirected by staff and regain composure. No further behavioral concerns observed or reported. Scheduled medications administered with no complications. Routine safety checks conducted per facility protocol. Encouraged patient to notify staff if any thoughts of harm towards self or others arise. Patient verbalizes understanding and agreement.

## 2023-07-24 NOTE — Progress Notes (Signed)
 Admission Note:  Pt admitted to [facility] from [facility] with AVH. Pt endorses voices telling her that she is bad and they are going to kill her. Pt endorses seeing dead people and ghost's. Pt denies SI and HI. Hx of Schizophrenia. Nurse reporting off to me at [facility] reported that pt has a peanut allergy, pt denied this. Pt does have an act team and mom reports that pt has been compliant with all meds. Pt lives alone, but Mom manages her meds. All consents have been signed, skin check complete, and pt oriented to the unit.

## 2023-07-24 NOTE — ED Notes (Signed)
 Writer called to give report. Informed by [facility] staff that RN who will be receiving this patient is currently busy and unable to take report at this time. Name and number left for call back.

## 2023-07-24 NOTE — ED Provider Notes (Signed)
 [facility] Continuous Assessment Admission H&P  Date: 07/23/23 Patient Name: Heather Kramer MRN: [id number] Chief Complaint: hearing voices  Diagnoses:  Final diagnoses:  Schizoaffective disorder, bipolar type (HCC)    Heather Kramer, 28 y/o female presented to [facility] as a walk in accompanied by her Heather Kramer [phone] with complaints of worsening auditory and visual hallucinations for about a week.  Patient seen face to face by this provider and chart reviewed on 07/23/23.  On evaluation Heather Kramer 28 year old single female with a history of schizoaffective disorder who lives alone in a boardinghouse.  Patient reports that her mother manages her medication she will put her medications in a pill planner each week.  Patient's mother reports that she is not sure if patient is actually taking her medications as prescribed. Mother reports that patient will be fine one minute and then crying the next.  Patient states that she is taking her medications as prescribed. Patient's mother states that different family members have agreed to call patient each morning and remind patient to take her medications.  Patient endorses that she has been smoking marijuana and drinking alcohol.  Patient states the last time that she smoked marijuana or drank alcohol was last week.  Patient reports that she has been experiencing auditory hallucinations and visual hallucinations. Patient reports auditory hallucinations appear as ghosts and dead people and Patient reports she hears a female voice saying she is going to die.  Patient reports she has been having poor sleep, feeling anxious, depressed, anhedonia, hopelessness and worthlessness.  Patient reports that she is being followed by [facility] and she is on their ACT team.  Patient reports that she last saw her ACT team on Friday 07/22/23 when her peer support brought her medications.  During evaluation Heather Kramer is sitting in  the assessment room in no acute distress.  She is alert, oriented x 4, calm, cooperative and attentive. Her mood is euthymic with congruent affect. She has normal speech, and behavior.  Objectively there is no evidence of psychosis/mania or delusional thinking.  Patient is able to converse coherently, goal directed thoughts, no distractibility, or pre-occupation.  She also denies suicidal/self-harm/homicidal ideation, psychosis, and paranoia.  Patient answered question appropriately.     Patient recommended for inpatient treatment and will be admitted to [facility] continuous observation for crisis management, safety and stabilization.   Total Time spent with patient: 20 minutes  Musculoskeletal  Strength & Muscle Tone: within normal limits Gait & Station: normal Patient leans: N/A  Psychiatric Specialty Exam  Presentation General Appearance:  Casual  Eye Contact: Fair  Speech: Clear and Coherent  Speech Volume: Normal  Handedness: Right   Mood and Affect  Mood: Euthymic  Affect: Congruent   Thought Process  Thought Processes: Coherent  Descriptions of Associations:Intact  Orientation:Full (Time, Place and Person)  Thought Content:WDL  Diagnosis of Schizophrenia or Schizoaffective disorder in past: Yes  Duration of Psychotic Symptoms: Greater than six months  Hallucinations:Hallucinations: Auditory Description of Auditory Hallucinations: A female saying she is going to die  Ideas of Reference:Paranoia  Suicidal Thoughts:Suicidal Thoughts: No  Homicidal Thoughts:Homicidal Thoughts: No   Sensorium  Memory: Immediate Fair; Remote Fair; Recent Fair  Judgment: Fair  Insight: Fair   Chartered certified accountant: Fair  Attention Span: Fair  Recall: [employer] of Knowledge: Fair  Language: Fair   Psychomotor Activity  Psychomotor Activity: Psychomotor Activity: Normal   Assets  Assets: Communication Skills; Physical Health;  Resilience; Social Support; Housing   Sleep  Sleep: Sleep: Poor Number of Hours of Sleep: 2   Nutritional Assessment (For OBS and FBC admissions only) Has the patient had a weight loss or gain of 10 pounds or more in the last 3 months?: No Has the patient had a decrease in food intake/or appetite?: No Does the patient have dental problems?: No Does the patient have eating habits or behaviors that may be indicators of an eating disorder including binging or inducing vomiting?: No Has the patient recently lost weight without trying?: 0 Has the patient been eating poorly because of a decreased appetite?: 0 Malnutrition Screening Tool Score: 0    Physical Exam HENT:     Head: Normocephalic.     Nose: Nose normal.  Eyes:     Pupils: Pupils are equal, round, and reactive to light.  Cardiovascular:     Rate and Rhythm: Normal rate.  Pulmonary:     Effort: Pulmonary effort is normal.  Abdominal:     General: Abdomen is flat.  Musculoskeletal:        General: Normal range of motion.     Cervical back: Normal range of motion.  Skin:    General: Skin is warm.  Neurological:     Mental Status: She is alert and oriented to person, place, and time.  Psychiatric:        Attention and Perception: She perceives auditory and visual hallucinations.        Mood and Affect: Affect is flat.        Speech: Speech normal.        Behavior: Behavior is actively hallucinating. Behavior is cooperative.        Thought Content: Thought content is paranoid and delusional. Thought content includes suicidal ideation. Thought content does not include homicidal ideation. Thought content includes suicidal plan. Thought content does not include homicidal plan.        Cognition and Memory: Cognition normal.        Judgment: Judgment is impulsive.    Review of Systems  Constitutional: Negative.   HENT: Negative.    Eyes: Negative.   Respiratory: Negative.    Cardiovascular: Negative.    Gastrointestinal: Negative.   Genitourinary: Negative.   Musculoskeletal: Negative.   Skin: Negative.   Neurological: Negative.   Psychiatric/Behavioral:  Positive for hallucinations.     Blood pressure 137/70, pulse 92, temperature 98.6 F (37 C), resp. rate 18, SpO2 100%. There is no height or weight on file to calculate BMI.  Past Psychiatric History: Schizoaffective disorder, [facility] April 03, 2023 to April 18, 2023  Is the patient at risk to self? No  Has the patient been a risk to self in the past 6 months? No .    Has the patient been a risk to self within the distant past? No   Is the patient a risk to others? No   Has the patient been a risk to others in the past 6 months? No   Has the patient been a risk to others within the distant past? No   Past Medical History:   Past Medical History:  Diagnosis Date   Acquired acanthosis nigricans    Allergies    Asthma    Goiter    Goiter    Overweight(278.02)    Pre-diabetes    Schizophrenia (HCC)    Thyroiditis, autoimmune         Family History:  Family History  Problem Relation Age of  Onset   Obesity Mother    Heart disease Father    Cancer Maternal Grandmother    Diabetes Maternal Grandfather      Social History: 3 y/o single female, unemployed lives in alone in a boarding house. Smokes marijuana and drinks alcohol  Last Labs:  Admission on 07/23/2023  Component Date Value Ref Range Status   WBC 07/23/2023 7.5  4.0 - 10.5 K/uL Final   RBC 07/23/2023 5.42 (H)  3.87 - 5.11 MIL/uL Final   Hemoglobin 07/23/2023 16.0 (H)  12.0 - 15.0 g/dL Final   HCT 92/87/7974 46.8 (H)  36.0 - 46.0 % Final   MCV 07/23/2023 86.3  80.0 - 100.0 fL Final   MCH 07/23/2023 29.5  26.0 - 34.0 pg Final   MCHC 07/23/2023 34.2  30.0 - 36.0 g/dL Final   RDW 92/87/7974 12.7  11.5 - 15.5 % Final   Platelets 07/23/2023 367  150 - 400 K/uL Final   nRBC 07/23/2023 0.0  0.0 - 0.2 % Final   Neutrophils Relative % 07/23/2023 50  % Final    Neutro Abs 07/23/2023 3.7  1.7 - 7.7 K/uL Final   Lymphocytes Relative 07/23/2023 38  % Final   Lymphs Abs 07/23/2023 2.9  0.7 - 4.0 K/uL Final   Monocytes Relative 07/23/2023 10  % Final   Monocytes Absolute 07/23/2023 0.7  0.1 - 1.0 K/uL Final   Eosinophils Relative 07/23/2023 2  % Final   Eosinophils Absolute 07/23/2023 0.2  0.0 - 0.5 K/uL Final   Basophils Relative 07/23/2023 0  % Final   Basophils Absolute 07/23/2023 0.0  0.0 - 0.1 K/uL Final   Immature Granulocytes 07/23/2023 0  % Final   Abs Immature Granulocytes 07/23/2023 0.02  0.00 - 0.07 K/uL Final   Performed at [facility], [address], [place], [place] [address]   Sodium 07/23/2023 136  135 - 145 mmol/L Final   Potassium 07/23/2023 3.9  3.5 - 5.1 mmol/L Final   Chloride 07/23/2023 103  98 - 111 mmol/L Final   CO2 07/23/2023 23  22 - 32 mmol/L Final   Glucose, Bld 07/23/2023 96  70 - 99 mg/dL Final   Glucose reference range applies only to samples taken after fasting for at least 8 hours.   BUN 07/23/2023 7  6 - 20 mg/dL Final   Creatinine, Ser 07/23/2023 1.09 (H)  0.44 - 1.00 mg/dL Final   Calcium 92/87/7974 10.0  8.9 - 10.3 mg/dL Final   Total Protein 92/87/7974 7.7  6.5 - 8.1 g/dL Final   Albumin 92/87/7974 4.2  3.5 - 5.0 g/dL Final   AST 92/87/7974 19  15 - 41 U/L Final   ALT 07/23/2023 14  0 - 44 U/L Final   Alkaline Phosphatase 07/23/2023 69  38 - 126 U/L Final   Total Bilirubin 07/23/2023 0.5  0.0 - 1.2 mg/dL Final   GFR, Estimated 07/23/2023 >60  >60 mL/min Final   Comment: (NOTE) Calculated using the CKD-EPI Creatinine Equation (2021)    Anion gap 07/23/2023 10  5 - 15 Final   Performed at [facility], [address], [place], [place] [address]   Cholesterol 07/23/2023 156  0 - 200 mg/dL Final   Triglycerides 92/87/7974 78  <150 mg/dL Final   HDL 92/87/7974 31 (L)  >40 mg/dL Final   Total CHOL/HDL Ratio 07/23/2023 5.0  RATIO Final   VLDL 07/23/2023 16  0 - 40 mg/dL Final   LDL Cholesterol  07/23/2023  109 (H)  0 - 99 mg/dL Final   Comment:        Total Cholesterol/HDL:CHD Risk Coronary Heart Disease Risk Table                     Men   Women  1/2 Average Risk   3.4   3.3  Average Risk       5.0   4.4  2 X Average Risk   9.6   7.1  3 X Average Risk  23.4   11.0        Use the calculated Patient Ratio above and the CHD Risk Table to determine the patient's CHD Risk.        ATP III CLASSIFICATION (LDL):  <100     mg/dL   Optimal  899-870  mg/dL   Near or Above                    Optimal  130-159  mg/dL   Borderline  839-810  mg/dL   High  >809     mg/dL   Very High Performed at [facility], [address], [place], [place] [address]    Alcohol, Ethyl (B) 07/23/2023 <15  <15 mg/dL Final   Comment: (NOTE) For medical purposes only. Performed at [facility], [address] [place], [place] [address]    Magnesium  07/23/2023 2.1  1.7 - 2.4 mg/dL Final   Performed at [facility], [address] [place], [place] [address]   POC Amphetamine UR 07/24/2023 None Detected  NONE DETECTED (Cut Off Level 1000 ng/mL) Final   POC Secobarbital (BAR) 07/24/2023 None Detected  NONE DETECTED (Cut Off Level 300 ng/mL) Final   POC Buprenorphine (BUP) 07/24/2023 None Detected  NONE DETECTED (Cut Off Level 10 ng/mL) Final   POC Oxazepam (BZO) 07/24/2023 None Detected  NONE DETECTED (Cut Off Level 300 ng/mL) Final   POC Cocaine UR 07/24/2023 None Detected  NONE DETECTED (Cut Off Level 300 ng/mL) Final   POC Methamphetamine UR 07/24/2023 None Detected  NONE DETECTED (Cut Off Level 1000 ng/mL) Final   POC Morphine 07/24/2023 None Detected  NONE DETECTED (Cut Off Level 300 ng/mL) Final   POC Methadone UR 07/24/2023 None Detected  NONE DETECTED (Cut Off Level 300 ng/mL) Final   POC Oxycodone UR 07/24/2023 None Detected  NONE DETECTED (Cut Off Level 100 ng/mL) Final   POC Marijuana UR 07/24/2023 Positive (A)  NONE DETECTED (Cut Off Level 50 ng/mL) Final   Preg Test, Ur  07/24/2023 Negative  Negative Final  Admission on 04/03/2023, Discharged on 04/12/2023  Component Date Value Ref Range Status   Neisseria Gonorrhea 04/04/2023 Negative   Final   Chlamydia 04/04/2023 Negative   Final   Comment 04/04/2023 Normal Reference Ranger Chlamydia - Negative   Final   Comment 04/04/2023 Normal Reference Range Neisseria Gonorrhea - Negative   Final   RPR Ser Ql 04/04/2023 NON REACTIVE  NON REACTIVE Final   Performed at [facility], [address], [place], [place] [address]   HIV Screen 4th Generation wRfx 04/04/2023 Non Reactive  Non Reactive Final   Performed at [facility], [address] [place], [place] [address]   Hgb A1c MFr Bld 04/05/2023 5.1  4.8 - 5.6 % Final   Comment: (NOTE) Pre diabetes:          5.7%-6.4%  Diabetes:              >  6.4%  Glycemic control for   <7.0% adults with diabetes    Mean Plasma Glucose 04/05/2023 99.67  mg/dL Final   Performed at [facility], [address] [place], [place] [address]   Cholesterol 04/05/2023 163  0 - 200 mg/dL Final   Triglycerides 96/74/7974 87  <150 mg/dL Final   HDL 96/74/7974 38 (L)  >40 mg/dL Final   Total CHOL/HDL Ratio 04/05/2023 4.3  RATIO Final   VLDL 04/05/2023 17  0 - 40 mg/dL Final   LDL Cholesterol 04/05/2023 108 (H)  0 - 99 mg/dL Final   Comment:        Total Cholesterol/HDL:CHD Risk Coronary Heart Disease Risk Table                     Men   Women  1/2 Average Risk   3.4   3.3  Average Risk       5.0   4.4  2 X Average Risk   9.6   7.1  3 X Average Risk  23.4   11.0        Use the calculated Patient Ratio above and the CHD Risk Table to determine the patient's CHD Risk.        ATP III CLASSIFICATION (LDL):  <100     mg/dL   Optimal  899-870  mg/dL   Near or Above                    Optimal  130-159  mg/dL   Borderline  839-810  mg/dL   High  >809     mg/dL   Very High Performed at [facility], [address] [place], [place] [address]     TSH 04/05/2023 2.311  0.350 - 4.500 uIU/mL Final   Comment: Performed by a 3rd Generation assay with a functional sensitivity of <=0.01 uIU/mL. Performed at [facility], [address], [place], [place] [address]    Vit D, 25-Hydroxy 04/05/2023 10.42 (L)  30 - 100 ng/mL Final   Comment: (NOTE) Vitamin D  deficiency has been defined by the Institute of Medicine  and an Endocrine Society practice guideline as a level of serum 25-OH  vitamin D  less than 20 ng/mL (1,2). The Endocrine Society went on to  further define vitamin D  insufficiency as a level between 21 and 29  ng/mL (2).  1. IOM (Institute of Medicine). 2010. Dietary reference intakes for  calcium and D. [place]: [employer]. 2. Holick MF, [place], Bischoff-Ferrari HA, et al. Evaluation,  treatment, and prevention of vitamin D  deficiency: an Endocrine  Society clinical practice guideline, JCEM. 2011 Jul; 96(7): 1911-30.  Performed at [facility], [address] [place], [place] [address]    Vitamin B-12 04/05/2023 251  180 - 914 pg/mL Final   Comment: (NOTE) This assay is not validated for testing neonatal or myeloproliferative syndrome specimens for Vitamin B12 levels. Performed at [facility], [address] [place], [place] [address]   Admission on 04/03/2023, Discharged on 04/03/2023  Component Date Value Ref Range Status   Sodium 04/03/2023 136  135 - 145 mmol/L Final   Potassium 04/03/2023 4.0  3.5 - 5.1 mmol/L Final   Chloride 04/03/2023 107  98 - 111 mmol/L Final   CO2 04/03/2023 22  22 - 32 mmol/L Final   Glucose, Bld 04/03/2023 86  70 - 99 mg/dL Final   Glucose reference  range applies only to samples taken after fasting for at least 8 hours.   BUN 04/03/2023 5 (L)  6 - 20 mg/dL Final   Creatinine, Ser 04/03/2023 0.93  0.44 - 1.00 mg/dL Final   Calcium 96/76/7974 9.3  8.9 - 10.3 mg/dL Final   Total Protein 96/76/7974 7.5  6.5 - 8.1 g/dL  Final   Albumin 96/76/7974 3.8  3.5 - 5.0 g/dL Final   AST 96/76/7974 16  15 - 41 U/L Final   ALT 04/03/2023 12  0 - 44 U/L Final   Alkaline Phosphatase 04/03/2023 65  38 - 126 U/L Final   Total Bilirubin 04/03/2023 0.6  0.0 - 1.2 mg/dL Final   GFR, Estimated 04/03/2023 >60  >60 mL/min Final   Comment: (NOTE) Calculated using the CKD-EPI Creatinine Equation (2021)    Anion gap 04/03/2023 7  5 - 15 Final   Performed at [facility], [address], [place], [place] [address]   Alcohol, Ethyl (B) 04/03/2023 <10  <10 mg/dL Final   Comment: (NOTE) Lowest detectable limit for serum alcohol is 10 mg/dL.  For medical purposes only. Performed at [facility], [address] [place], [place] [address]    Salicylate Lvl 04/03/2023 <7.0 (L)  7.0 - 30.0 mg/dL Final   Performed at [facility], [address], [place], [place] [address]   Acetaminophen  (Tylenol ), Serum 04/03/2023 <10 (L)  10 - 30 ug/mL Final   Comment: (NOTE) Therapeutic concentrations vary significantly. A range of 10-30 ug/mL  may be an effective concentration for many patients. However, some  are best treated at concentrations outside of this range. Acetaminophen  concentrations >150 ug/mL at 4 hours after ingestion  and >50 ug/mL at 12 hours after ingestion are often associated with  toxic reactions.  Performed at [facility], [address], [place], [place] [address]    WBC 04/03/2023 6.0  4.0 - 10.5 K/uL Final   RBC 04/03/2023 5.17 (H)  3.87 - 5.11 MIL/uL Final   Hemoglobin 04/03/2023 15.1 (H)  12.0 - 15.0 g/dL Final   HCT 96/76/7974 44.8  36.0 - 46.0 % Final   MCV 04/03/2023 86.7  80.0 - 100.0 fL Final   MCH 04/03/2023 29.2  26.0 - 34.0 pg Final   MCHC 04/03/2023 33.7  30.0 - 36.0 g/dL Final   RDW 96/76/7974 12.8  11.5 - 15.5 % Final   Platelets 04/03/2023 396  150 - 400 K/uL Final   nRBC 04/03/2023 0.0  0.0 - 0.2 % Final   Performed at [facility], [address]  [place], [place] [address]   Opiates 04/03/2023 NONE DETECTED  NONE DETECTED Final   Cocaine 04/03/2023 NONE DETECTED  NONE DETECTED Final   Benzodiazepines 04/03/2023 NONE DETECTED  NONE DETECTED Final   Amphetamines 04/03/2023 NONE DETECTED  NONE DETECTED Final   Tetrahydrocannabinol 04/03/2023 POSITIVE (A)  NONE DETECTED Final   Barbiturates 04/03/2023 NONE DETECTED  NONE DETECTED Final   Comment: (NOTE) DRUG SCREEN FOR MEDICAL PURPOSES ONLY.  IF CONFIRMATION IS NEEDED FOR ANY PURPOSE, NOTIFY LAB WITHIN 5 DAYS.  LOWEST DETECTABLE LIMITS FOR URINE DRUG SCREEN Drug Class                     Cutoff (ng/mL) Amphetamine and metabolites    1000 Barbiturate and metabolites    200 Benzodiazepine                 200  Opiates and metabolites        300 Cocaine and metabolites        300 THC                            50 Performed at [facility], [address] [place], [place] [address]    Preg, Serum 04/03/2023 NEGATIVE  NEGATIVE Final   Comment:        THE SENSITIVITY OF THIS METHODOLOGY IS >10 mIU/mL. Performed at [facility], [address], [place], [place] [address]     Allergies: Peanut-containing drug products  Medications:  Facility Ordered Medications  Medication   acetaminophen  (TYLENOL ) tablet 650 mg   alum & mag hydroxide-simeth (MAALOX/MYLANTA) 200-200-20 MG/5ML suspension 30 mL   magnesium  hydroxide (MILK OF MAGNESIA) suspension 30 mL   haloperidol  (HALDOL ) tablet 5 mg   And   diphenhydrAMINE  (BENADRYL ) capsule 50 mg   haloperidol  lactate (HALDOL ) injection 5 mg   And   diphenhydrAMINE  (BENADRYL ) injection 50 mg   And   LORazepam  (ATIVAN ) injection 2 mg   haloperidol  lactate (HALDOL ) injection 10 mg   And   diphenhydrAMINE  (BENADRYL ) injection 50 mg   And   LORazepam  (ATIVAN ) injection 2 mg   hydrOXYzine  (ATARAX ) tablet 25 mg   albuterol  (VENTOLIN  HFA) 108 (90 Base) MCG/ACT inhaler 1-2 puff   nicotine  (NICODERM CQ  - dosed in mg/24 hours) patch  21 mg   risperiDONE  (RISPERDAL  M-TABS) disintegrating tablet 1 mg   valbenazine  (INGREZZA ) capsule 40 mg   traZODone  (DESYREL ) tablet 100 mg   risperiDONE  (RISPERDAL  M-TABS) disintegrating tablet 4 mg   PTA Medications  Medication Sig   albuterol  (VENTOLIN  HFA) 108 (90 Base) MCG/ACT inhaler Inhale 1-2 puffs into the lungs every 6 (six) hours as needed for wheezing or shortness of breath.   nicotine  (NICODERM CQ  - DOSED IN MG/24 HOURS) 21 mg/24hr patch Place 1 patch (21 mg total) onto the skin daily.   traZODone  (DESYREL ) 50 MG tablet Take 1 tablet (50 mg total) by mouth at bedtime as needed for sleep.   valbenazine  (INGREZZA ) 40 MG capsule Take 1 capsule (40 mg total) by mouth daily.   Vitamin D , Ergocalciferol , (DRISDOL ) 1.25 MG (50000 UNIT) CAPS capsule Take 1 capsule (50,000 Units total) by mouth every 7 (seven) days.   risperiDONE  (RISPERDAL  M-TABS) 1 MG disintegrating tablet Take 1 tablet (1 mg total) by mouth daily.   risperiDONE  (RISPERDAL  M-TABS) 3 MG disintegrating tablet Take 1 tablet (3 mg total) by mouth at bedtime.      Medical Decision Making  Heather Kramer, 28 y/o female presented to [facility] as a walk in accompanied by her Heather Kramer [phone] with complaints of worsening auditory and visual hallucinations for about a week..    Recommendations  Based on my evaluation the patient does not appear to have an emergency medical condition. Patient recommended for inpatient treatment and will be admitted to [facility] continuous observation for crisis management, safety and stabilization.   Freeman Borba E Anadelia Kintz, NP 07/24/23  6:50 AM

## 2023-07-24 NOTE — ED Notes (Signed)
 Pt is resting quietly with eyes closed  in recliner . No distress noted. Staff will continue to monitor pt for safety.

## 2023-07-24 NOTE — ED Notes (Signed)
 Patient resting in lounger. Calm, collected, no physical complaints at this time. Patient in no apparent acute distress. Environment secured. Safety checks in place per facility protocol.

## 2023-07-24 NOTE — Progress Notes (Signed)
 Pt has been accepted to [facility] on 07/24/2023 . Bed assignment:300-1   Pt meets inpatient criteria per Alan Mcardle, NP   Attending Physician will be Dr. Prentis.  Report can be called to: - Adult unit: [phone]  Pt can arrive pending discharges   Care Team Notified: [facility] Bretta Qua, RN,  Alan Mcardle, NP, Zane Ward, RN

## 2023-07-24 NOTE — Progress Notes (Signed)
   07/24/23 2000  Psych Admission Type (Psych Patients Only)  Admission Status Voluntary  Psychosocial Assessment  Patient Complaints Isolation  Eye Contact Fair  Facial Expression Sad  Affect Appropriate to circumstance  Speech Other (Comment) (Appropriate)  Interaction Demanding  Motor Activity Other (Comment) (WNL)  Appearance/Hygiene In scrubs  Behavior Characteristics Appropriate to situation  Mood Sad  Thought Process  Coherency WDL  Content WDL  Delusions None reported or observed  Perception WDL  Hallucination None reported or observed  Judgment Impaired  Confusion None  Danger to Self  Current suicidal ideation?  (Denies)  Agreement Not to Harm Self Yes  Description of Agreement Notify Staff.  Danger to Others  Danger to Others None reported or observed

## 2023-07-24 NOTE — Tx Team (Signed)
 Initial Treatment Plan 07/24/2023 3:14 PM Heather CATALEYA CRISTINA FMW:[id number]    PATIENT STRESSORS: Financial difficulties   Loss of family members     PATIENT STRENGTHS: Capable of independent living  Motivation for treatment/growth  Supportive family/friends    PATIENT IDENTIFIED PROBLEMS: Hearing voices  Seeing dead people                   DISCHARGE CRITERIA:  Improved stabilization in mood, thinking, and/or behavior Verbal commitment to aftercare and medication compliance  PRELIMINARY DISCHARGE PLAN: Outpatient therapy Return to previous living arrangement  PATIENT/FAMILY INVOLVEMENT: This treatment plan has been presented to and reviewed with the patient, Heather Kramer. The patient and family have been given the opportunity to ask questions and make suggestions.  Heather JINNY Cassette, RN 07/24/2023, 3:14 PM

## 2023-07-24 NOTE — ED Notes (Signed)
 Pt approached nurse station and states can anyone else hear these voices?  Pt reported that the voices were bothersome. Pt received scheduled Risperdal  1 mg and reported that it had not been effective.    Pt is not aggressive and redirectable Pt agreed to take haldol  5 mg po and benadryl  50 mg po.

## 2023-07-24 NOTE — Plan of Care (Signed)
   Problem: Education: Goal: Knowledge of [facility] Education information/materials will improve Outcome: Progressing Goal: Verbalization of understanding the information provided will improve Outcome: Progressing

## 2023-07-25 ENCOUNTER — Encounter (HOSPITAL_COMMUNITY): Payer: Self-pay

## 2023-07-25 LAB — VITAMIN D 25 HYDROXY (VIT D DEFICIENCY, FRACTURES): Vit D, 25-Hydroxy: 26.36 ng/mL — ABNORMAL LOW (ref 30–100)

## 2023-07-25 LAB — PROLACTIN: Prolactin: 96 ng/mL — ABNORMAL HIGH (ref 4.8–33.4)

## 2023-07-25 LAB — VITAMIN B12: Vitamin B-12: 293 pg/mL (ref 180–914)

## 2023-07-25 MED ORDER — RISPERIDONE 3 MG PO TBDP
6.0000 mg | ORAL_TABLET | Freq: Every day | ORAL | Status: DC
  Administered 2023-07-25 – 2023-07-26 (×2): 6 mg via ORAL
  Filled 2023-07-25 (×2): qty 2

## 2023-07-25 NOTE — Plan of Care (Signed)
  Problem: Education: Goal: Emotional status will improve Outcome: Progressing Goal: Mental status will improve Outcome: Progressing Goal: Verbalization of understanding the information provided will improve Outcome: Progressing   Problem: Activity: Goal: Interest or engagement in activities will improve Outcome: Progressing Goal: Sleeping patterns will improve Outcome: Progressing   Problem: Coping: Goal: Ability to verbalize frustrations and anger appropriately will improve Outcome: Progressing Goal: Ability to demonstrate self-control will improve Outcome: Progressing   Problem: Health Behavior/Discharge Planning: Goal: Identification of resources available to assist in meeting health care needs will improve Outcome: Progressing   Problem: Physical Regulation: Goal: Ability to maintain clinical measurements within normal limits will improve Outcome: Progressing   Problem: Safety: Goal: Periods of time without injury will increase Outcome: Progressing

## 2023-07-25 NOTE — Progress Notes (Incomplete)
 27 year old African-American female with history of schizoaffective disorder versus schizophrenia.  Patient is linked to an ACT team.  She presents with auditory hallucination of voices saying different things to her.  She feels overwhelmed by her voices.  She is agreeable to higher dose of risperidone .  We will explore longer acting injectable if she responds. We will optimize risperidone  to 6 mg at bedtime only.  We will keep other medications at the same dose and evaluate her further.

## 2023-07-25 NOTE — Group Note (Signed)
 Recreation Therapy Group Note   Group Topic:Problem Solving  Group Date: 07/25/2023 Start Time: 0930 End Time: 1000 Facilitators: Silvia Hightower-McCall, LRT,CTRS Location: [address] Dayroom   Group Topic: Problem Solving  Goal Area(s) Addresses:  Patient will effectively work in a team with other group members. Patient will verbalize importance of using appropriate problem solving techniques.  Patient will identify positive change associated with effective problem solving skills.   Behavioral Response:   Intervention: Worksheet, Music  Activity: Science writer. Patients were given a front and back worksheet of brain teasers. Patients worked together to figure out each individual puzzle presented on sheet. Patients had 20 minutes to complete the brain teasers. LRT went over the answers with patients at the end.    Education: Problem Solving, Communication, Cognitive Skills  Education Outcome: Acknowledges understanding/In group clarification offered/Needs additional education.    Affect/Mood: N/A   Participation Level: Did not attend    Clinical Observations/Individualized Feedback:     Plan: Continue to engage patient in RT group sessions 2-3x/week.   Kaleea Penner-McCall, LRT,CTRS 07/25/2023 1:11 PM

## 2023-07-25 NOTE — BH IP Treatment Plan (Unsigned)
 Interdisciplinary Treatment and Diagnostic Plan Update  07/25/2023 Time of Session: 1124 Heather Kramer MRN: [id number]  Principal Diagnosis: Schizoaffective disorder (HCC)  Secondary Diagnoses: Principal Problem:   Schizoaffective disorder (HCC)   Current Medications:  Current Facility-Administered Medications  Medication Dose Route Frequency Provider Last Rate Last Admin   acetaminophen  (TYLENOL ) tablet 650 mg  650 mg Oral Q6H PRN Brent, Amanda C, NP       albuterol  (VENTOLIN  HFA) 108 (90 Base) MCG/ACT inhaler 1-2 puff  1-2 puff Inhalation Q6H PRN Brent, Amanda C, NP       alum & mag hydroxide-simeth (MAALOX/MYLANTA) 200-200-20 MG/5ML suspension 30 mL  30 mL Oral Q4H PRN Brent, Amanda C, NP       haloperidol  (HALDOL ) tablet 5 mg  5 mg Oral TID PRN Brent, Amanda C, NP       And   diphenhydrAMINE  (BENADRYL ) capsule 50 mg  50 mg Oral TID PRN Brent, Amanda C, NP       haloperidol  lactate (HALDOL ) injection 5 mg  5 mg Intramuscular TID PRN Thresa Alan BROCKS, NP       And   diphenhydrAMINE  (BENADRYL ) injection 50 mg  50 mg Intramuscular TID PRN Brent, Amanda C, NP       And   LORazepam  (ATIVAN ) injection 2 mg  2 mg Intramuscular TID PRN Brent, Amanda C, NP       haloperidol  lactate (HALDOL ) injection 10 mg  10 mg Intramuscular TID PRN Thresa Alan BROCKS, NP       And   diphenhydrAMINE  (BENADRYL ) injection 50 mg  50 mg Intramuscular TID PRN Brent, Amanda C, NP       And   LORazepam  (ATIVAN ) injection 2 mg  2 mg Intramuscular TID PRN Brent, Amanda C, NP       hydrOXYzine  (ATARAX ) tablet 25 mg  25 mg Oral TID PRN Brent, Amanda C, NP       magnesium  hydroxide (MILK OF MAGNESIA) suspension 30 mL  30 mL Oral Daily PRN Brent, Amanda C, NP       nicotine  (NICODERM CQ  - dosed in mg/24 hours) patch 21 mg  21 mg Transdermal Daily Brent, Amanda C, NP   21 mg at 07/25/23 0849   risperiDONE  (RISPERDAL  M-TABS) disintegrating tablet 6 mg  6 mg Oral QHS Izediuno, Jerrell DELENA, MD       traZODone  (DESYREL )  tablet 50 mg  50 mg Oral QHS PRN Brent, Amanda C, NP   50 mg at 07/24/23 2124   valbenazine  (INGREZZA ) capsule 40 mg  40 mg Oral Daily Brent, Amanda C, NP   40 mg at 07/25/23 0850   PTA Medications: Medications Prior to Admission  Medication Sig Dispense Refill Last Dose/Taking   albuterol  (VENTOLIN  HFA) 108 (90 Base) MCG/ACT inhaler Inhale 1-2 puffs into the lungs every 6 (six) hours as needed for wheezing or shortness of breath. 1 each 0 Past Week   risperiDONE  (RISPERDAL ) 1 MG tablet Take 1 mg by mouth daily.   Past Week   risperidone  (RISPERDAL ) 4 MG tablet Take 4 mg by mouth at bedtime.   Past Week   traZODone  (DESYREL ) 50 MG tablet Take 1 tablet (50 mg total) by mouth at bedtime as needed for sleep. 30 tablet 0 Past Week   valbenazine  (INGREZZA ) 40 MG capsule Take 1 capsule (40 mg total) by mouth daily. (Patient taking differently: Take 80 mg by mouth daily.) 30 capsule 0 Past Month    Patient Stressors: Financial difficulties  Loss of family members    Patient Strengths: Capable of independent living  Motivation for treatment/growth  Supportive family/friends   Treatment Modalities: Medication Management, Group therapy, Case management,  1 to 1 session with clinician, Psychoeducation, Recreational therapy.   Physician Treatment Plan for Primary Diagnosis: Schizoaffective disorder (HCC) Long Term Goal(s): Improvement in symptoms so as ready for discharge   Short Term Goals: Ability to identify changes in lifestyle to reduce recurrence of condition will improve Ability to verbalize feelings will improve Ability to disclose and discuss suicidal ideas Ability to demonstrate self-control will improve Ability to identify and develop effective coping behaviors will improve Ability to maintain clinical measurements within normal limits will improve Compliance with prescribed medications will improve Ability to identify triggers associated with substance abuse/mental health issues  will improve  Medication Management: Evaluate patient's response, side effects, and tolerance of medication regimen.  Therapeutic Interventions: 1 to 1 sessions, Unit Group sessions and Medication administration.  Evaluation of Outcomes: Not Progressing  Physician Treatment Plan for Secondary Diagnosis: Principal Problem:   Schizoaffective disorder (HCC)  Long Term Goal(s): Improvement in symptoms so as ready for discharge   Short Term Goals: Ability to identify changes in lifestyle to reduce recurrence of condition will improve Ability to verbalize feelings will improve Ability to disclose and discuss suicidal ideas Ability to demonstrate self-control will improve Ability to identify and develop effective coping behaviors will improve Ability to maintain clinical measurements within normal limits will improve Compliance with prescribed medications will improve Ability to identify triggers associated with substance abuse/mental health issues will improve     Medication Management: Evaluate patient's response, side effects, and tolerance of medication regimen.  Therapeutic Interventions: 1 to 1 sessions, Unit Group sessions and Medication administration.  Evaluation of Outcomes: Not Progressing   RN Treatment Plan for Primary Diagnosis: Schizoaffective disorder (HCC) Long Term Goal(s): Knowledge of disease and therapeutic regimen to maintain health will improve  Short Term Goals: Ability to remain free from injury will improve, Ability to verbalize frustration and anger appropriately will improve, Ability to demonstrate self-control, Ability to participate in decision making will improve, Ability to verbalize feelings will improve, Ability to disclose and discuss suicidal ideas, Ability to identify and develop effective coping behaviors will improve, and Compliance with prescribed medications will improve  Medication Management: RN will administer medications as ordered by provider,  will assess and evaluate patient's response and provide education to patient for prescribed medication. RN will report any adverse and/or side effects to prescribing provider.  Therapeutic Interventions: 1 on 1 counseling sessions, Psychoeducation, Medication administration, Evaluate responses to treatment, Monitor vital signs and CBGs as ordered, Perform/monitor CIWA, COWS, AIMS and Fall Risk screenings as ordered, Perform wound care treatments as ordered.  Evaluation of Outcomes: Not Progressing   LCSW Treatment Plan for Primary Diagnosis: Schizoaffective disorder (HCC) Long Term Goal(s): Safe transition to appropriate next level of care at discharge, Engage patient in therapeutic group addressing interpersonal concerns.  Short Term Goals: Engage patient in aftercare planning with referrals and resources, Increase social support, Increase ability to appropriately verbalize feelings, Increase emotional regulation, Facilitate acceptance of mental health diagnosis and concerns, Facilitate patient progression through stages of change regarding substance use diagnoses and concerns, Identify triggers associated with mental health/substance abuse issues, and Increase skills for wellness and recovery  Therapeutic Interventions: Assess for all discharge needs, 1 to 1 time with Social worker, Explore available resources and support systems, Assess for adequacy in community support network, Educate family and significant  other(s) on suicide prevention, Complete Psychosocial Assessment, Interpersonal group therapy.  Evaluation of Outcomes: Not Progressing   Progress in Treatment: Attending groups: No. Participating in groups: No. Taking medication as prescribed: Yes. Toleration medication: Yes. Family/Significant other contact made: No, will contact:  mother , [name] [phone] Patient understands diagnosis: Yes. Discussing patient identified problems/goals with staff: Yes. Medical problems  stabilized or resolved: Yes. Denies suicidal/homicidal ideation: Yes. Issues/concerns per patient self-inventory: No. Other: n/a  New problem(s) identified: No, Describe:  none  New Short Term/Long Term Goal(s): medication stabilization, elimination of SI thoughts, development of comprehensive mental wellness plan.   Patient Goals:  to get out of here  Discharge Plan or Barriers: Patient recently admitted. CSW will continue to follow and assess for appropriate referrals and possible discharge planning.    Reason for Continuation of Hospitalization: Anxiety Delusions  Hallucinations Medication stabilization Other; describe mood stabilization, discharge planning  Estimated Length of Stay: 5-7 DAYS  Last 3 [place] Suicide Severity Risk Score: Flowsheet Row [facility] ([facility]/2023 [facility] (Discharged) [facility]  C-SSRS RISK CATEGORY No Risk No Risk Low Risk    Last PHQ 2/9 Scores:     No data to display          Scribe for Treatment Team: Darvin Dials N Arlethia Basso, LCSW 07/25/2023 4:33 PM

## 2023-07-25 NOTE — BHH Suicide Risk Assessment (Signed)
 Suicide Risk Assessment  Admission Assessment    [facility] Admission Suicide Risk Assessment   Nursing information obtained from:  Patient Demographic factors:  Living alone, Unemployed, Gay, lesbian, or bisexual orientation, Adolescent or young adult, Low socioeconomic status Current Mental Status:  NA Loss Factors:  Financial problems / change in socioeconomic status Historical Factors:  NA Risk Reduction Factors:  Positive social support  Total Time spent with patient: 45 minutes  Principal Problem: Schizoaffective disorder (HCC) Diagnosis:  Principal Problem:   Schizoaffective disorder (HCC)  Subjective Data: Heather Kramer is a 28 year old African-American female with prior psychiatric diagnoses significant for schizoaffective disorder, psychosis, paranoid schizophrenia, depression and anxiety who presents voluntarily to [facility] from [facility] behavioral health urgent care for complaint of worsening auditory and visual hallucinations x 1 week.  Patient reports multiple inpatient psychiatric hospitalizations for her psychosis.  Last hospitalized at [facility] on April 2025.  UDS positive for marijuana.  Continued Clinical Symptoms:  Alcohol Use Disorder Identification Test Final Score (AUDIT): 0 The Alcohol Use Disorders Identification Test, Guidelines for Use in Primary Care, Second Edition.  World Science writer [facility]). Score between 0-7:  no or low risk or alcohol related problems. Score between 8-15:  moderate risk of alcohol related problems. Score between 16-19:  high risk of alcohol related problems. Score 20 or above:  warrants further diagnostic evaluation for alcohol dependence and treatment.  CLINICAL FACTORS:   Severe Anxiety and/or Agitation Depression:   Anhedonia Delusional Severe Alcohol/Substance Abuse/Dependencies More than one psychiatric diagnosis Previous Psychiatric Diagnoses and  Treatments Medical Diagnoses and Treatments/Surgeries  Musculoskeletal: Strength & Muscle Tone: within normal limits Gait & Station: normal Patient leans: N/A  Psychiatric Specialty Exam:  Presentation  General Appearance:  Casual; Fairly Groomed  Eye Contact: Good  Speech: Clear and Coherent; Normal Rate  Speech Volume: Normal  Handedness: Right  Mood and Affect  Mood: Dysphoric; Depressed; Anxious; Worthless; Hopeless  Affect: Congruent  Thought Process  Thought Processes: Coherent  Descriptions of Associations:Intact  Orientation:Full (Time, Place and Person)  Thought Content:Logical  History of Schizophrenia/Schizoaffective disorder:Yes  Duration of Psychotic Symptoms:Greater than six months  Hallucinations:Hallucinations: Auditory; Command Description of Command Hallucinations: Hearing both female and female voices saying you are going to die Description of Auditory Hallucinations: Hearing both female and female voices saying you are going to die get out of  Ideas of Reference:Delusions; Paranoia  Suicidal Thoughts:Suicidal Thoughts: No  Homicidal Thoughts:Homicidal Thoughts: Yes, Passive (Patient reports,  I feel like I want to fight anybody) HI Passive Intent and/or Plan: Without Intent; Without Plan; Without Means to Carry Out; Without Access to Means  Sensorium  Memory: Immediate Fair; Recent Fair  Judgment: Fair  Insight: Fair  Executive Functions  Concentration: Good  Attention Span: Good  Recall: [employer] of Knowledge: Fair  Language: Fair  Psychomotor Activity  Psychomotor Activity: Psychomotor Activity: Normal  Assets  Assets: Communication Skills; Desire for Improvement; Physical Health; Resilience  Sleep  Sleep: Sleep: Good Number of Hours of Sleep: 8  Physical Exam: Physical Exam Vitals and nursing note reviewed.  Constitutional:      General: She is not in acute distress.    Appearance: She is  obese. She is not ill-appearing.  HENT:     Head: Normocephalic.     Right Ear: External ear normal.     Left Ear: External ear normal.     Mouth/Throat:     Mouth: Mucous membranes are  moist.     Pharynx: Oropharynx is clear.  Eyes:     Extraocular Movements: Extraocular movements intact.  Cardiovascular:     Rate and Rhythm: Normal rate.     Pulses: Normal pulses.  Pulmonary:     Effort: Pulmonary effort is normal. No respiratory distress.  Abdominal:     Comments: Deferred  Genitourinary:    Comments: Deferred  Musculoskeletal:        General: Normal range of motion.     Cervical back: Normal range of motion.  Skin:    General: Skin is warm.  Neurological:     General: No focal deficit present.     Mental Status: She is alert and oriented to person, place, and time.  Psychiatric:        Mood and Affect: Mood normal.        Behavior: Behavior normal.    Review of Systems  Constitutional:  Negative for fever.  HENT:  Negative for sore throat.   Eyes:  Negative for blurred vision.  Respiratory:  Negative for cough, sputum production, shortness of breath and wheezing.   Cardiovascular:  Negative for chest pain and palpitations.  Gastrointestinal:  Negative for abdominal pain, constipation, diarrhea, heartburn, nausea and vomiting.  Genitourinary:  Negative for dysuria, frequency and urgency.  Musculoskeletal:  Negative for falls.  Skin:  Negative for itching and rash.  Neurological:  Negative for dizziness and headaches.  Endo/Heme/Allergies:        See allergy listing  Psychiatric/Behavioral:  Positive for depression and hallucinations. Negative for substance abuse and suicidal ideas. The patient is nervous/anxious. The patient does not have insomnia.    Blood pressure 119/75, pulse 98, temperature (!) 97.3 F (36.3 C), temperature source Oral, resp. rate 16, height 6' 1 (1.854 m), weight 103.7 kg, SpO2 100%. Body mass index is 30.16 kg/m.  COGNITIVE FEATURES THAT  CONTRIBUTE TO RISK:  Polarized thinking    SUICIDE RISK:   Moderate:  Frequent suicidal ideation with limited intensity, and duration, some specificity in terms of plans, no associated intent, good self-control, limited dysphoria/symptomatology, some risk factors present, and identifiable protective factors, including available and accessible social support.  PLAN OF CARE: Treatment Plan Summary: Daily contact with patient to assess and evaluate symptoms and progress in treatment and Medication management   Physician Treatment Plan for Primary Diagnosis: Schizoaffective disorder (HCC) Long Term Goal(s): Improvement in symptoms so as ready for discharge  Short Term Goals: Ability to identify changes in lifestyle to reduce recurrence of condition will improve, Ability to verbalize feelings will improve, Ability to disclose and discuss suicidal ideas, Ability to demonstrate self-control will improve, Ability to identify and develop effective coping behaviors will improve, Ability to maintain clinical measurements within normal limits will improve, Compliance with prescribed medications will improve, and Ability to identify triggers associated with substance abuse/mental health issues will improve  Physician Treatment Plan for Secondary Diagnosis: Assessment:  Heather Kramer is a 28 year old African-American female with prior psychiatric diagnoses significant for schizoaffective disorder, psychosis, paranoid schizophrenia, depression and anxiety who presents voluntarily to [facility] from [facility] behavioral health urgent care for complaint of worsening auditory and visual hallucinations x 1 week.  Patient reports multiple inpatient psychiatric hospitalizations for her psychosis.  Last hospitalized at [facility] on April 2025.  UDS positive for marijuana.   Principal Problem:   Schizoaffective disorder (HCC)  Plans: Medications: --  Continue RISPERDAL  M-TABS disintegrating tablet 6 mg p.o.  at bedtime for psychosis --Continue valbenazine  (INGREZZA ) capsule 40 mg p.o. daily for prevention of EPS -- Continue hydroxyzine  tablet 25 mg p.o. 3 times daily as needed for anxiety -- Continue trazodone  tablet 50 mg p.o. at bedtime as needed for sleep  Medication for other medical problems: --Continue nicotine  patch 21 mg transdermal over 24 hours for smoking cessation --Continue albuterol  108 (90 base) microgram/ACT inhaler 1 to 2 puffs every 6 hours as needed for wheezing or shortness of breath  Other PRN Medications  -Acetaminophen  650 mg every 6 as needed/mild pain  -Maalox 30 mL oral every 4 as needed/digestion  -Magnesium  hydroxide 30 mL daily as needed/mild constipation   --The risks/benefits/side-effects/alternatives to this medication were discussed in detail with the patient and time was given for questions. The patient consents to medication trial.   -- Metabolic profile and EKG monitoring obtained while on an atypical antipsychotic (BMI: Lipid Panel: HbgA1c: QTc:)   -- Encouraged patient to participate in unit milieu and in scheduled group therapies   Continue BH Agitation Protocol  --Haldol  5 mg, oral, 3 times daily as needed, mild agitation  --Benadryl  50 mg, oral, 3 times daily as needed, mild agitation                                      OR   --Haldol  injection 5 mg, IM, 3 times daily as needed, moderate agitation  --Benadryl  injection 50 mg, IM, 3 times daily as needed, moderate agitation  --Ativan  injection 2 mg, IM, 3 times daily as needed, moderate agitation                                      OR  --Haldol  injection 10 mg, IM, 3 times daily as needed, severe agitation  --Benadryl  injection 50 mg, IM, 3 times daily as needed, severe agitation  --Ativan  injection 2 mg, IM, 3 times daily as needed, severe agitation.  Admission labs reviewed: CMP: Creatinine 1.09 elevated, otherwise normal.  Lipid profile:  HDL 31 Low, LDL 109 elevated, otherwise normal.  CBC with differential: RBC 5.42 elevated, hemoglobin 16.0 elevated, HCT 46.8 elevated, otherwise normal.  Prolactin: 96.0 elevated.  Pregnancy test: Negative.  TSH: 2.311 normal.  BAL.:  Less than 15.  UDS: Positive for marijuana.  New labs ordered: Vitamin D  25-hydroxy, vitamin B12.  EKG reviewed: Normal sinus rhythm, ventricular rate 98, QT/QTc 342/436  Safety and Monitoring:  Voluntary admission to inpatient psychiatric unit for safety, stabilization and treatment  Daily contact with patient to assess and evaluate symptoms and progress in treatment  Patient's case to be discussed in multi-disciplinary team meeting  Observation Level : q15 minute checks  Vital signs: q12 hours  Precautions: suicide, but pt currently verbally contracts for safety on unit?   Discharge Planning:  Social work and case management to assist with discharge planning and identification of hospital follow-up needs prior to discharge  Estimated LOS: 5-7?days  Discharge Concerns: Need to establish a safety plan; Medication compliance and effectiveness  Discharge Goals: Return home with outpatient referrals for mental health follow-up including medication management/psychotherapy.    I certify that inpatient services furnished can reasonably be expected to improve the patient's condition.   Ellouise JAYSON Azure, FNP 07/25/2023, 1:32 PM

## 2023-07-25 NOTE — Progress Notes (Signed)
 Patient ID: Heather Kramer, female   DOB: October 07, 1995, 28 y.o.   MRN: [id number]  Pt A/O x4 with noted A/H. Pt able to verbalize needs. [id number] Pt just reported to staff that she is currently hearing voices. Pt offered PRN. Pt declined. Pt's mood is guarded. Pt denies HI and confirms SI with no plan. Monitoring continues during 7p-7a. Passing on care to On-coming shift.

## 2023-07-25 NOTE — [facility] Group Notes (Signed)
 Psychoeducational Group Note  Date:  07/25/2023 Time:  2000  Group Topic/Focus:  Wrap up group  Participation Level: Did Not Attend  Participation Quality:  Not Applicable  Affect:  Not Applicable  Cognitive:  Not Applicable  Insight:  Not Applicable  Engagement in Group: Not Applicable  Additional Comments:  Did not attend.   Lenora Manuelita RAMAN 07/25/2023, 8:39 PM

## 2023-07-25 NOTE — Plan of Care (Signed)
  Problem: Education: Goal: Knowledge of [place] [facility] information/materials will improve Outcome: Progressing Goal: Emotional status will improve Outcome: Progressing   Problem: Activity: Goal: Sleeping patterns will improve Outcome: Progressing   Problem: Safety: Goal: Periods of time without injury will increase Outcome: Progressing   Problem: Activity: Goal: Interest or engagement in activities will improve Outcome: Not Progressing

## 2023-07-25 NOTE — Progress Notes (Signed)
   07/25/23 0800  Psych Admission Type (Psych Patients Only)  Admission Status Voluntary  Psychosocial Assessment  Patient Complaints Isolation  Eye Contact Fair  Facial Expression Sad  Affect Appropriate to circumstance  Speech Logical/coherent;Slow  Interaction Demanding  Motor Activity Other (Comment)  Appearance/Hygiene Unremarkable  Behavior Characteristics Appropriate to situation;Cooperative  Mood Depressed  Thought Process  Coherency WDL  Content WDL  Delusions None reported or observed  Perception WDL  Hallucination None reported or observed  Judgment Impaired  Confusion None  Danger to Self  Agreement Not to Harm Self Yes  Description of Agreement Verbal contracts for safety

## 2023-07-25 NOTE — H&P (Signed)
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 3 times daily as needed, moderate agitation                                      OR  --Haldol  injection 10 mg, IM, 3 times daily as needed, severe agitation  --Benadryl  injection 50 mg, IM, 3 times daily as needed, severe agitation  --Ativan  injection 2 mg, IM, 3 times daily as needed, severe agitation.  Admission labs reviewed: CMP: Creatinine 1.09 elevated, otherwise normal.  Lipid profile:  HDL 31 Low, LDL 109 elevated, otherwise normal.  CBC with differential: RBC 5.42 elevated, hemoglobin 16.0 elevated, HCT 46.8 elevated, otherwise normal.  Prolactin: 96.0 elevated.  Pregnancy test: Negative.  TSH: 2.311 normal.  BAL.:  Less than 15.  UDS: Positive for marijuana.  New labs ordered: Vitamin D  25-hydroxy, vitamin B12.  EKG reviewed: Normal sinus rhythm, ventricular rate 98, QT/QTc 342/436  Safety and Monitoring:  Voluntary admission to inpatient psychiatric unit for safety, stabilization and treatment  Daily contact with patient to assess and evaluate symptoms and progress in treatment  Patient's case to be discussed in multi-disciplinary team meeting  Observation Level : q15 minute checks  Vital signs: q12 hours  Precautions: suicide, but pt currently verbally contracts for safety on unit?   Discharge Planning:  Social work and case management to assist with discharge planning and identification of hospital follow-up needs prior to discharge  Estimated LOS: 5-7?days  Discharge Concerns: Need to establish a safety plan; Medication compliance and effectiveness  Discharge Goals: Return home with outpatient referrals for mental health follow-up including medication management/psychotherapy.   I certify that inpatient services furnished can reasonably be expected to improve the patient's condition.    Taneia Mealor C  Day Deery, FNP 7/14/20251:37 PM

## 2023-07-25 NOTE — Progress Notes (Signed)
   07/25/23 2015  Psych Admission Type (Psych Patients Only)  Admission Status Voluntary  Psychosocial Assessment  Patient Complaints Isolation  Eye Contact Fair  Facial Expression Flat  Affect Appropriate to circumstance  Speech Logical/coherent  Interaction Cautious  Motor Activity Slow  Appearance/Hygiene Unremarkable  Behavior Characteristics Cooperative  Mood Depressed  Aggressive Behavior  Effect No apparent injury  Thought Process  Coherency WDL  Content WDL  Delusions WDL  Perception Hallucinations  Hallucination Auditory  Judgment Impaired  Confusion WDL  Danger to Self  Current suicidal ideation? Denies  Danger to Others  Danger to Others Reported or observed  Danger to Others Abnormal  Harmful Behavior to others Threats of violence towards other people observed or expressed   Description of Harmful Behavior want to hurt exe's

## 2023-07-25 NOTE — Plan of Care (Signed)
  Problem: Activity: Goal: Interest or engagement in activities will improve Outcome: Progressing   Problem: Coping: Goal: Ability to verbalize frustrations and anger appropriately will improve Outcome: Progressing   Problem: Physical Regulation: Goal: Ability to maintain clinical measurements within normal limits will improve Outcome: Progressing   

## 2023-07-26 NOTE — Progress Notes (Signed)
 Patient has tolerated recent adjustments made to her antipsychotic medication.  She continues to endorse auditory hallucination.  She was responding to internal stimuli and required agitation protocol.  She has participated with some of the groups and therapeutic activities. We will keep her medicines the same and give it time to become fully effective.  Not stable for a lower level of care.

## 2023-07-26 NOTE — [facility] Group Notes (Signed)
Patient did not attend the Wrap-up group. 

## 2023-07-26 NOTE — BHH Group Notes (Signed)

## 2023-07-26 NOTE — Progress Notes (Signed)
   07/26/23 1000  Psych Admission Type (Psych Patients Only)  Admission Status Voluntary  Psychosocial Assessment  Patient Complaints Isolation;Anxiety;Other (Comment) (AH)  Eye Contact Fair  Facial Expression Flat  Affect Appropriate to circumstance  Speech Logical/coherent  Interaction Cautious  Motor Activity Slow  Appearance/Hygiene Unremarkable  Behavior Characteristics Cooperative  Mood Depressed;Anxious  Thought Process  Coherency WDL  Content WDL  Delusions None reported or observed  Perception Hallucinations  Hallucination Auditory  Judgment Impaired  Confusion WDL  Danger to Self  Current suicidal ideation? Denies  Agreement Not to Harm Self Yes  Description of Agreement verbal  Danger to Others  Danger to Others Reported or observed  Danger to Others Abnormal  Harmful Behavior to others Threats of violence towards other people observed or expressed   Description of Harmful Behavior wants to hurt the voices in her head that

## 2023-07-26 NOTE — Group Note (Signed)
 LCSW Group Therapy Note   Group Date: 07/26/2023 Start Time: 1100 End Time: 1200   Participation:  patient was present and actively participated in the conversation   Type of Therapy:  Group Therapy  Topic:  Healing Hearts: A Safe Space for Grief  Objective:  The objective of this group, Healing Hearts: A Safe Space for Grief, is to create a compassionate environment where participants can process their grief, explore different stages of grief, and discover ways to honor their loved ones through personal rituals.  3 Goals: Provide a safe and supportive space where participants feel comfortable sharing their feelings and experiences of grief without judgment. Educate participants about the stages of grief and emphasize that there is no right way to grieve or a fixed timeline for healing. Introduce the concept of rituals as a means to process grief, allowing individuals to honor their loved ones in a personal and meaningful way.  Summary:  In Healing Hearts: A Safe Space for Grief, we explored the unique and personal journey of grief, emphasizing that everyone experiences it differently. We discussed the five stages of grief (denial, anger, bargaining, depression, and acceptance), with the understanding that grief is not linear. Rituals were introduced as a way to help cope with loss, offering comfort and connection through meaningful actions such as lighting candles or taking memory walks. Participants were encouraged to express their emotions, focus on self-care, and reflect on moments of gratitude for their loved ones, recognizing that healing is a process and there is no timeline for grief.   Mishawn Hemann O Marquest Gunkel, LCSWA 07/26/2023  5:34 PM

## 2023-07-26 NOTE — Group Note (Signed)
 Recreation Therapy Group Note   Group Topic:Communication  Group Date: 07/26/2023 Start Time: 0947 End Time: 1025 Facilitators: Kalisi Bevill-McCall, LRT,CTRS Location: [address] Day[address]   Group Topic: Communication, Problem Solving   Goal Area(s) Addresses:  Patient will effectively listen to complete activity.  Patient will identify communication skills used to make activity successful.  Patient will identify how skills used during activity can be used to reach post d/c goals.    Behavioral Response:    Intervention: Building surveyor Activity - Geometric pattern cards, pencils, blank paper    Activity: Geometric Drawings.  Three volunteers from the peer group will be shown an abstract picture with a particular arrangement of geometrical shapes.  Each round, one 'speaker' will describe the pattern, as accurately as possible without revealing the image to the group.  The remaining group members will listen and draw the picture to reflect how it is described to them. Patients with the role of 'listener' cannot ask clarifying questions but, may request that the speaker repeat a direction. Once the drawings are complete, the presenter will show the rest of the group the picture and compare how close each person came to drawing the picture. LRT will facilitate a post-activity discussion regarding effective communication and the importance of planning, listening, and asking for clarification in daily interactions with others.  Education: Environmental consultant, Active listening, Support systems, Discharge planning  Education Outcome: Acknowledges understanding/In group clarification offered/Needs additional education.    Affect/Mood: N/A   Participation Level: Did not attend    Clinical Observations/Individualized Feedback:     Plan: Continue to engage patient in RT group sessions 2-3x/week.   Qianna Clagett-McCall, LRT,CTRS 07/26/2023 1:21 PM

## 2023-07-26 NOTE — Plan of Care (Signed)
  Problem: Education: Goal: Emotional status will improve Outcome: Progressing Goal: Verbalization of understanding the information provided will improve Outcome: Progressing   Problem: Activity: Goal: Sleeping patterns will improve Outcome: Progressing   Problem: Coping: Goal: Ability to demonstrate self-control will improve Outcome: Progressing   Problem: Health Behavior/Discharge Planning: Goal: Compliance with treatment plan for underlying cause of condition will improve Outcome: Progressing

## 2023-07-26 NOTE — Progress Notes (Signed)
 Pt did not attend group.

## 2023-07-26 NOTE — Progress Notes (Signed)
 [facility] MD Progress Note  07/26/2023 1:34 PM TEONIA YAGER  MRN:  [id number]  Principal Problem: Schizoaffective disorder (HCC) Diagnosis: Principal Problem:   Schizoaffective disorder (HCC)  Reason for admission:  Nehal A Jhaveri is a 28 year old African-American female with prior psychiatric diagnoses significant for schizoaffective disorder, psychosis, paranoid schizophrenia, depression and anxiety who presents voluntarily to [facility] from [facility] behavioral health urgent care for complaint of worsening auditory and visual hallucinations x 1 week.  Patient reports multiple inpatient psychiatric hospitalizations for her psychosis.  Last hospitalized at [facility] on April 2025.  UDS positive for marijuana.  24-hour chart review: Patient Case discussed in the interdisciplinary meeting.  Vital signs reviewed without clinical values.  Agitation protocol x 1 administered.  Hydroxyzine  x 1 required for agitation and trazodone  x 1 required for insomnia.  Today's assessment notes: On assessment today, the pt reports that her mood is not depressed, however, reports anxiety of 9/10 due to hearing voices telling her to set up.  Patient reports HI towards voices in her head.  Received agitation protocol x 1, earlier on this shift.  Chart reviewed and findings shared with the treatment team and consult with attending psychiatrist with recommendation to continue Risperdal  disintegrating tablets 6 mg p.o. daily at bedtime and Ingrezza  capsule 40 mg p.o. daily.  No EPS, cogwheel rigidity or akathisia noted during this assessment. Nursing staff report patient sleeping over 10 hours last night.  Appetite is good.  Concentration is improving. Energy level is adequate. Patient denies suicidal thoughts.  Further denies suicidal intent and plan. Denies having any HI. Denies having psychotic symptoms.  Observed attending and participating in therapeutic milieu  and unit group activities.  Denies having side effects to current psychiatric medications.   Total Time spent with patient: 45 minutes  Past Psychiatric History: Previous Psych Diagnoses: Schizoaffective disorder, depression, anxiety, marijuana dependence, paranoid schizophrenia, psychosis Prior inpatient treatment: Patient report several inpatient psychiatric admissions.  Last admission to [facility] April 2025 Current/prior outpatient treatment: Yes, patient is being followed by ACT team from [facility].  Last seen by ACT Team on 07/22/2023 Prior rehab hx: Denies Psychotherapy hx: Yes History of suicide: Denies History of homicide or aggression: Denies, however patient states, I feel like I want to fight with somebody. Psychiatric medication history: Yes Psychiatric medication compliance history: Compliance, however, mom thinks patient is not compliant with medications. Neuromodulation history: Denies Current Psychiatrist: Yes, Dr. Malinda from [facility] Current therapist: None  Past Medical History:  Past Medical History:  Diagnosis Date   Acquired acanthosis nigricans    Allergies    Asthma    Goiter    Goiter    Overweight(278.02)    Pre-diabetes    Schizophrenia (HCC)    Thyroiditis, autoimmune     Past Surgical History:  Procedure Laterality Date   MOUTH SURGERY     Family History:  Family History  Problem Relation Age of Onset   Obesity Mother    Heart disease Father    Cancer Maternal Grandmother    Diabetes Maternal Grandfather    Family Psychiatric  History: See H&P Social History:  Social History   Substance and Sexual Activity  Alcohol Use Yes   Comment: occasionally     Social History   Substance and Sexual Activity  Drug Use Not Currently   Types: Marijuana    Social History   Socioeconomic History   Marital status: Single    Spouse  name: Not on file   Number of children: Not on file   Years of education: Not on file   Highest  education level: 12th grade  Occupational History   Not on file  Tobacco Use   Smoking status: Every Day    Current packs/day: 2.00    Types: Cigarettes   Smokeless tobacco: Never  Vaping Use   Vaping status: Never Used  Substance and Sexual Activity   Alcohol use: Yes    Comment: occasionally   Drug use: Not Currently    Types: Marijuana   Sexual activity: Not Currently  Other Topics Concern   Not on file  Social History Narrative   Lives with mother and brother. 9th grade at [employer]. Plays basketball.    Social Drivers of Corporate investment banker Strain: Not on File (10/14/2021)   Received from [facility]    Financial Resource Strain: 0  Food Insecurity: No Food Insecurity (07/24/2023)   Hunger Vital Sign    Worried About Running Out of Food in the Last Year: Never true    Ran Out of Food in the Last Year: Never true  Transportation Needs: No Transportation Needs (07/24/2023)   PRAPARE - Administrator, Civil Service (Medical): No    Lack of Transportation (Non-Medical): No  Physical Activity: Not on File (10/14/2021)   Received from [facility]   Physical Activity    Physical Activity: 0  Stress: Not on File (10/14/2021)   Received from [facility]   Stress    Stress: 0  Social Connections: Not on File (09/20/2022)   Received from [facility]   Social Connections    Connectedness: 0   Additional Social History:   Sleep: Good Estimated Sleeping Duration (Last 24 Hours): 7.00-7.75 hours  Appetite:  Good  Current Medications: Current Facility-Administered Medications  Medication Dose Route Frequency Provider Last Rate Last Admin   acetaminophen  (TYLENOL ) tablet 650 mg  650 mg Oral Q6H PRN Brent, Amanda C, NP       albuterol  (VENTOLIN  HFA) 108 (90 Base) MCG/ACT inhaler 1-2 puff  1-2 puff Inhalation Q6H PRN Brent, Amanda C, NP       alum & mag hydroxide-simeth (MAALOX/MYLANTA) 200-200-20 MG/5ML suspension 30 mL  30 mL Oral  Q4H PRN Brent, Amanda C, NP       haloperidol  (HALDOL ) tablet 5 mg  5 mg Oral TID PRN Brent, Amanda C, NP   5 mg at 07/26/23 9161   And   diphenhydrAMINE  (BENADRYL ) capsule 50 mg  50 mg Oral TID PRN Brent, Amanda C, NP   50 mg at 07/26/23 9161   haloperidol  lactate (HALDOL ) injection 5 mg  5 mg Intramuscular TID PRN Brent, Amanda C, NP       And   diphenhydrAMINE  (BENADRYL ) injection 50 mg  50 mg Intramuscular TID PRN Thresa Alan BROCKS, NP       And   LORazepam  (ATIVAN ) injection 2 mg  2 mg Intramuscular TID PRN Brent, Amanda C, NP       haloperidol  lactate (HALDOL ) injection 10 mg  10 mg Intramuscular TID PRN Thresa Alan BROCKS, NP       And   diphenhydrAMINE  (BENADRYL ) injection 50 mg  50 mg Intramuscular TID PRN Brent, Amanda C, NP       And   LORazepam  (ATIVAN ) injection 2 mg  2 mg Intramuscular TID PRN Brent, Amanda C, NP       hydrOXYzine  (  ATARAX ) tablet 25 mg  25 mg Oral TID PRN Brent, Amanda C, NP   25 mg at 07/25/23 2118   magnesium  hydroxide (MILK OF MAGNESIA) suspension 30 mL  30 mL Oral Daily PRN Brent, Amanda C, NP       nicotine  (NICODERM CQ  - dosed in mg/24 hours) patch 21 mg  21 mg Transdermal Daily Brent, Amanda C, NP   21 mg at 07/26/23 9161   risperiDONE  (RISPERDAL  M-TABS) disintegrating tablet 6 mg  6 mg Oral QHS Izediuno, Vincent A, MD   6 mg at 07/25/23 2118   traZODone  (DESYREL ) tablet 50 mg  50 mg Oral QHS PRN Brent, Amanda C, NP   50 mg at 07/25/23 2118   valbenazine  (INGREZZA ) capsule 40 mg  40 mg Oral Daily Brent, Amanda C, NP   40 mg at 07/26/23 9161    Lab Results:  Results for orders placed or performed during the hospital encounter of 07/24/23 (from the past 48 hours)  VITAMIN D  25 Hydroxy (Vit-D Deficiency, Fractures)     Status: Abnormal   Collection Time: 07/25/23  6:30 PM  Result Value Ref Range   Vit D, 25-Hydroxy 26.36 (L) 30 - 100 ng/mL    Comment: (NOTE) Vitamin D  deficiency has been defined by the Institute of Medicine  and an Endocrine Society  practice guideline as a level of serum 25-OH  vitamin D  less than 20 ng/mL (1,2). The Endocrine Society went on to  further define vitamin D  insufficiency as a level between 21 and 29  ng/mL (2).  1. IOM (Institute of Medicine). 2010. Dietary reference intakes for  calcium and D. [place]: [employer]. 2. Holick MF, Binkley [place], Bischoff-Ferrari HA, et al. Evaluation,  treatment, and prevention of vitamin D  deficiency: an Endocrine  Society clinical practice guideline, JCEM. 2011 Jul; 96(7): 1911-30.  Performed at [facility], [address], [place], [place] [address]   Vitamin B12     Status: None   Collection Time: 07/25/23  6:30 PM  Result Value Ref Range   Vitamin B-12 293 180 - 914 pg/mL    Comment: (NOTE) This assay is not validated for testing neonatal or myeloproliferative syndrome specimens for Vitamin B12 levels. Performed at [facility], [address] [place], [place] [address]     Blood Alcohol level:  Lab Results  Component Value Date   [facility] <15 07/23/2023   ETH <10 04/03/2023    Metabolic Disorder Labs: Lab Results  Component Value Date   HGBA1C 5.1 04/05/2023   MPG 99.67 04/05/2023   MPG 103 06/13/2020   Lab Results  Component Value Date   PROLACTIN 96.0 (H) 07/23/2023   PROLACTIN 26.5 (H) 06/13/2020   Lab Results  Component Value Date   CHOL 156 07/23/2023   TRIG 78 07/23/2023   HDL 31 (L) 07/23/2023   CHOLHDL 5.0 07/23/2023   VLDL 16 07/23/2023   LDLCALC 109 (H) 07/23/2023   LDLCALC 108 (H) 04/05/2023    Physical Findings: AIMS:  ,  ,  ,  ,  ,  ,   CIWA:    COWS:     Musculoskeletal: Strength & Muscle Tone: within normal limits Gait & Station: normal Patient leans: N/A  Psychiatric Specialty Exam:  Presentation  General Appearance:  Appropriate for Environment; Casual  Eye Contact: Good  Speech: Clear and Coherent  Speech Volume: Normal  Handedness: Right  Mood and  Affect  Mood: Anxious; Depressed  Affect:  Congruent  Thought Process  Thought Processes: Coherent  Descriptions of Associations:Intact  Orientation:Full (Time, Place and Person)  Thought Content:Logical  History of Schizophrenia/Schizoaffective disorder:Yes  Duration of Psychotic Symptoms:Greater than six months  Hallucinations:Hallucinations: Auditory Description of Command Hallucinations: Hearing voices telling her to shut up Description of Auditory Hallucinations: Hearing voices telling her to shut up  Ideas of Reference:Paranoia  Suicidal Thoughts:Suicidal Thoughts: No  Homicidal Thoughts:Homicidal Thoughts: No HI Passive Intent and/or Plan: -- (Denies)  Sensorium  Memory: Immediate Good; Recent Good  Judgment: Fair  Insight: Fair  Art therapist  Concentration: Fair  Attention Span: Good  Recall: Fair  Fund of Knowledge: Fair  Language: Good  Psychomotor Activity  Psychomotor Activity: Psychomotor Activity: Normal  Assets  Assets: Communication Skills; Desire for Improvement; Housing; Physical Health; Resilience  Sleep  Sleep: Sleep: Good Number of Hours of Sleep: 9.5  Physical Exam: Physical Exam Vitals and nursing note reviewed.  Constitutional:      Appearance: She is obese.  HENT:     Head: Normocephalic.     Right Ear: External ear normal.     Left Ear: External ear normal.     Nose: Nose normal.     Mouth/Throat:     Mouth: Mucous membranes are moist.     Pharynx: Oropharynx is clear.  Eyes:     Extraocular Movements: Extraocular movements intact.  Cardiovascular:     Rate and Rhythm: Normal rate.     Pulses: Normal pulses.  Pulmonary:     Effort: Pulmonary effort is normal.  Abdominal:     Comments: Deferred  Genitourinary:    Comments: Deferred Musculoskeletal:        General: Normal range of motion.     Cervical back: Normal range of motion.  Skin:    General: Skin is warm.  Neurological:      General: No focal deficit present.     Mental Status: She is alert and oriented to person, place, and time.  Psychiatric:        Mood and Affect: Mood normal.        Behavior: Behavior normal.    Review of Systems  Constitutional:  Negative for chills and fever.  HENT:  Negative for sore throat.   Eyes:  Negative for blurred vision.  Respiratory:  Negative for wheezing.   Cardiovascular:  Negative for chest pain and palpitations.  Gastrointestinal:  Negative for abdominal pain, constipation, diarrhea, heartburn, nausea and vomiting.  Genitourinary:  Negative for dysuria.  Musculoskeletal:  Negative for falls.  Skin:  Negative for itching and rash.  Neurological:  Negative for dizziness and headaches.  Endo/Heme/Allergies:        See allergy listing  Psychiatric/Behavioral:  Positive for depression and hallucinations. Negative for substance abuse and suicidal ideas. The patient is nervous/anxious. The patient does not have insomnia.    Blood pressure 104/72, pulse 96, temperature 97.7 F (36.5 C), temperature source Oral, resp. rate 18, height 6' 1 (1.854 m), weight 103.7 kg, SpO2 100%. Body mass index is 30.16 kg/m.  Treatment Plan Summary: Daily contact with patient to assess and evaluate symptoms and progress in treatment and Medication management Assessment:  Amparo A Scruggs is a 28 year old African-American female with prior psychiatric diagnoses significant for schizoaffective disorder, psychosis, paranoid schizophrenia, depression and anxiety who presents voluntarily to [facility] from [facility] behavioral health urgent care for complaint of worsening auditory and visual hallucinations x 1 week.  Patient reports multiple inpatient psychiatric hospitalizations  for her psychosis.  Last hospitalized at [facility] on April 2025.  UDS positive for marijuana.    Principal Problem:   Schizoaffective disorder (HCC)    Plans: Medications: -- Continue RISPERDAL  M-TABS disintegrating tablet 6 mg p.o. at bedtime for psychosis --Continue valbenazine  (INGREZZA ) capsule 40 mg p.o. daily for prevention of EPS -- Continue hydroxyzine  tablet 25 mg p.o. 3 times daily as needed for anxiety -- Continue trazodone  tablet 50 mg p.o. at bedtime as needed for sleep   Medication for other medical problems: --Continue nicotine  patch 21 mg transdermal over 24 hours for smoking cessation --Continue albuterol  108 (90 base) microgram/ACT inhaler 1 to 2 puffs every 6 hours as needed for wheezing or shortness of breath   Other PRN Medications  -Acetaminophen  650 mg every 6 as needed/mild pain  -Maalox 30 mL oral every 4 as needed/digestion  -Magnesium  hydroxide 30 mL daily as needed/mild constipation    --The risks/benefits/side-effects/alternatives to this medication were discussed in detail with the patient and time was given for questions. The patient consents to medication trial.   -- Metabolic profile and EKG monitoring obtained while on an atypical antipsychotic (BMI: Lipid Panel: HbgA1c: QTc:)   -- Encouraged patient to participate in unit milieu and in scheduled group therapies    Continue BH Agitation Protocol  --Haldol  5 mg, oral, 3 times daily as needed, mild agitation  --Benadryl  50 mg, oral, 3 times daily as needed, mild agitation                                      OR   --Haldol  injection 5 mg, IM, 3 times daily as needed, moderate agitation  --Benadryl  injection 50 mg, IM, 3 times daily as needed, moderate agitation  --Ativan  injection 2 mg, IM, 3 times daily as needed, moderate agitation                                      OR  --Haldol  injection 10 mg, IM, 3 times daily as needed, severe agitation  --Benadryl  injection 50 mg, IM, 3 times daily as needed, severe agitation  --Ativan  injection 2 mg, IM, 3 times daily as needed, severe agitation.   Admission labs reviewed: CMP: Creatinine 1.09 elevated,  otherwise normal.  Lipid profile: HDL 31 Low, LDL 109 elevated, otherwise normal.  CBC with differential: RBC 5.42 elevated, hemoglobin 16.0 elevated, HCT 46.8 elevated, otherwise normal.  Prolactin: 96.0 elevated.  Pregnancy test: Negative.  TSH: 2.311 normal.  BAL.:  Less than 15.  UDS: Positive for marijuana.   New labs ordered: Vitamin D  25-hydroxy, vitamin B12.   EKG reviewed: Normal sinus rhythm, ventricular rate 98, QT/QTc 342/436   Safety and Monitoring:  Voluntary admission to inpatient psychiatric unit for safety, stabilization and treatment  Daily contact with patient to assess and evaluate symptoms and progress in treatment  Patient's case to be discussed in multi-disciplinary team meeting  Observation Level : q15 minute checks  Vital signs: q12 hours  Precautions: suicide, but pt currently verbally contracts for safety on unit?    Discharge Planning:  Social work and case management to assist with discharge planning and identification of hospital follow-up needs prior to discharge  Estimated LOS: 5-7?days  Discharge Concerns: Need to establish a safety plan; Medication compliance and effectiveness  Discharge Goals:  Return home with outpatient referrals for mental health follow-up including medication management/psychotherapy.    Physician Treatment Plan for Secondary Diagnosis: I certify that inpatient services furnished can reasonably be expected to improve the patient's condition.     Ellouise JAYSON Azure, FNP 07/26/2023, 1:34 PM

## 2023-07-26 NOTE — Progress Notes (Signed)
 Patient received PRN haloperidol  and diphenhydramine  per [facility] for confusion and auditory hallucinations. Patient expressed that she had thoughts of hurting others. Patient stated that she wanted to hurt the voices in her head that were of people she knew. Patient was visibly anxious and disquieted by the voices. Patient was calm and cooperative during administration.

## 2023-07-27 LAB — VITAMIN D 25 HYDROXY (VIT D DEFICIENCY, FRACTURES): Vit D, 25-Hydroxy: 30.81 ng/mL (ref 30–100)

## 2023-07-27 LAB — VITAMIN B12: Vitamin B-12: 280 pg/mL (ref 180–914)

## 2023-07-27 MED ORDER — RISPERIDONE 3 MG PO TBDP
6.0000 mg | ORAL_TABLET | Freq: Every day | ORAL | Status: DC
  Administered 2023-07-27 – 2023-07-29 (×3): 6 mg via ORAL
  Filled 2023-07-27 (×3): qty 2

## 2023-07-27 MED ORDER — HALOPERIDOL 5 MG PO TABS
10.0000 mg | ORAL_TABLET | Freq: Every day | ORAL | Status: DC
  Administered 2023-07-27 – 2023-07-29 (×3): 10 mg via ORAL
  Filled 2023-07-27 (×3): qty 2

## 2023-07-27 MED ORDER — RISPERIDONE 3 MG PO TBDP: 8.0000 mg | ORAL_TABLET | Freq: Every day | ORAL | Status: DC

## 2023-07-27 NOTE — Progress Notes (Signed)
   07/26/23 2150  Psych Admission Type (Psych Patients Only)  Admission Status Voluntary  Psychosocial Assessment  Patient Complaints Isolation  Eye Contact Glaring;Intense  Facial Expression Flat  Affect Appropriate to circumstance  Speech Logical/coherent  Interaction Guarded;Isolative  Motor Activity Other (Comment) (WNL)  Appearance/Hygiene In scrubs  Behavior Characteristics Appropriate to situation  Mood Other (Comment) (Withdrawn)  Thought Process  Coherency WDL  Content WDL  Delusions None reported or observed  Perception WDL  Hallucination None reported or observed  Judgment Poor  Confusion None  Danger to Self  Current suicidal ideation?  (Denies)  Agreement Not to Harm Self Yes  Description of Agreement Notify Staff  Danger to Others  Danger to Others None reported or observed

## 2023-07-27 NOTE — Group Note (Signed)
 Date:  07/27/2023 Time:  9:07 PM  Group Topic/Focus:  Wrap-Up Group:   The focus of this group is to help patients review their daily goal of treatment and discuss progress on daily workbooks.    Participation Level:  Did Not Attend  Participation Quality:  N/A  Affect:  N/A  Cognitive:  N/A  Insight: None  Engagement in Group:  N/A  Modes of Intervention:  N/A  Additional Comments:  Patient did not attend wrap up group.  Eward Mace 07/27/2023, 9:07 PM

## 2023-07-27 NOTE — Plan of Care (Signed)

## 2023-07-27 NOTE — Progress Notes (Incomplete)
 Patient continues to respond to internal stimuli.  She continues to require as needed medication.  We will optimize risperidone  to 8 mg at bedtime.  If not responding at the maximum dose, we might have to switch patient to a different antipsychotic medication. Patient is not stable for a lower level of care.

## 2023-07-27 NOTE — Progress Notes (Signed)
 [facility] MD Progress Note  07/27/2023 12:07 PM JAMEYAH FENNEWALD  MRN:  [id number]  Principal Problem: Schizoaffective disorder (HCC) Diagnosis: Principal Problem:   Schizoaffective disorder (HCC)  Reason for admission:  Heather Kramer is a 28 year old African-American female with prior psychiatric diagnoses significant for schizoaffective disorder, psychosis, paranoid schizophrenia, depression and anxiety who presents voluntarily to [facility] from [facility] behavioral health urgent care for complaint of worsening auditory and visual hallucinations x 1 week.  Patient reports multiple inpatient psychiatric hospitalizations for her psychosis.  Last hospitalized at [facility] on April 2025.  UDS positive for marijuana.  24-hour chart review: Patient Case discussed in the interdisciplinary meeting.  Vital signs reviewed without clinical values.  Agitation protocol x 1 administered.  Trazodone  x 1 required for insomnia.  Today's assessment notes: Patient presents alert and oriented to person, place, time and situation. She reports that her mood is not depressed, however, reports anxiety of 5/10 due to hearing voices telling her to shut-up and stay in her room.  Patient reports HI towards a person called Wanda Novak in her head. Received agitation protocol x 1, last night due to agitation and wanting to leave the hospital.  Chart reviewed and findings shared with the treatment team and consult with attending psychiatrist with recommendation to continue Risperdal  disintegrating tablets 6 mg p.o. daily at bedtime, Ingrezza  capsule 40 mg p.o. daily and to initiate Haldol  10 mg po at bedtime to her treatment plan due to residual hallucinations. No EPS, or cogwheel rigidity noted during this assessment. Nursing staff report patient sleeping over 11 hours last night.  Appetite is good.  Concentration is improving. Energy level is adequate. Patient denies  suicidal thoughts or visual hallucination.  Further denies suicidal intent and plan. Encouraged to attend therapeutic milieu and unit group activities.  Denies having side effects to current psychiatric medications.   Total Time spent with patient: 45 minutes  Past Psychiatric History: Previous Psych Diagnoses: Schizoaffective disorder, depression, anxiety, marijuana dependence, paranoid schizophrenia, psychosis Prior inpatient treatment: Patient report several inpatient psychiatric admissions.  Last admission to [facility] April 2025 Current/prior outpatient treatment: Yes, patient is being followed by ACT team from [facility].  Last seen by ACT Team on 07/22/2023 Prior rehab hx: Denies Psychotherapy hx: Yes History of suicide: Denies History of homicide or aggression: Denies, however patient states, I feel like I want to fight with somebody. Psychiatric medication history: Yes Psychiatric medication compliance history: Compliance, however, mom thinks patient is not compliant with medications. Neuromodulation history: Denies Current Psychiatrist: Yes, Dr. Malinda from [facility] Current therapist: None  Past Medical History:  Past Medical History:  Diagnosis Date   Acquired acanthosis nigricans    Allergies    Asthma    Goiter    Goiter    Overweight(278.02)    Pre-diabetes    Schizophrenia (HCC)    Thyroiditis, autoimmune     Past Surgical History:  Procedure Laterality Date   MOUTH SURGERY     Family History:  Family History  Problem Relation Age of Onset   Obesity Mother    Heart disease Father    Cancer Maternal Grandmother    Diabetes Maternal Grandfather    Family Psychiatric  History: See H&P Social History:  Social History   Substance and Sexual Activity  Alcohol Use Yes   Comment: occasionally     Social History   Substance and Sexual Activity  Drug Use Not Currently  Types: Marijuana    Social History   Socioeconomic History   Marital  status: Single    Spouse name: Not on file   Number of children: Not on file   Years of education: Not on file   Highest education level: 12th grade  Occupational History   Not on file  Tobacco Use   Smoking status: Every Day    Current packs/day: 2.00    Types: Cigarettes   Smokeless tobacco: Never  Vaping Use   Vaping status: Never Used  Substance and Sexual Activity   Alcohol use: Yes    Comment: occasionally   Drug use: Not Currently    Types: Marijuana   Sexual activity: Not Currently  Other Topics Concern   Not on file  Social History Narrative   Lives with mother and brother. 9th grade at [employer]. Plays basketball.    Social Drivers of Corporate investment banker Strain: Not on File (10/14/2021)   Received from [facility]    Financial Resource Strain: 0  Food Insecurity: No Food Insecurity (07/24/2023)   Hunger Vital Sign    Worried About Running Out of Food in the Last Year: Never true    Ran Out of Food in the Last Year: Never true  Transportation Needs: No Transportation Needs (07/24/2023)   PRAPARE - Administrator, Civil Service (Medical): No    Lack of Transportation (Non-Medical): No  Physical Activity: Not on File (10/14/2021)   Received from [facility]   Physical Activity    Physical Activity: 0  Stress: Not on File (10/14/2021)   Received from [facility]   Stress    Stress: 0  Social Connections: Not on File (09/20/2022)   Received from [facility]   Social Connections    Connectedness: 0   Additional Social History:   Sleep: Good Estimated Sleeping Duration (Last 24 Hours): 8.50-11.00 hours  Appetite:  Good  Current Medications: Current Facility-Administered Medications  Medication Dose Route Frequency Provider Last Rate Last Admin   acetaminophen  (TYLENOL ) tablet 650 mg  650 mg Oral Q6H PRN Brent, Amanda C, NP       albuterol  (VENTOLIN  HFA) 108 (90 Base) MCG/ACT inhaler 1-2 puff  1-2 puff Inhalation  Q6H PRN Brent, Amanda C, NP       alum & mag hydroxide-simeth (MAALOX/MYLANTA) 200-200-20 MG/5ML suspension 30 mL  30 mL Oral Q4H PRN Brent, Amanda C, NP       haloperidol  (HALDOL ) tablet 5 mg  5 mg Oral TID PRN Brent, Amanda C, NP   5 mg at 07/26/23 2148   And   diphenhydrAMINE  (BENADRYL ) capsule 50 mg  50 mg Oral TID PRN Brent, Amanda C, NP   50 mg at 07/26/23 2148   haloperidol  lactate (HALDOL ) injection 5 mg  5 mg Intramuscular TID PRN Brent, Amanda C, NP       And   diphenhydrAMINE  (BENADRYL ) injection 50 mg  50 mg Intramuscular TID PRN Brent, Amanda C, NP       And   LORazepam  (ATIVAN ) injection 2 mg  2 mg Intramuscular TID PRN Brent, Amanda C, NP       haloperidol  lactate (HALDOL ) injection 10 mg  10 mg Intramuscular TID PRN Brent, Amanda C, NP       And   diphenhydrAMINE  (BENADRYL ) injection 50 mg  50 mg Intramuscular TID PRN Thresa Alan BROCKS, NP       And   LORazepam  (ATIVAN )  injection 2 mg  2 mg Intramuscular TID PRN Brent, Amanda C, NP       hydrOXYzine  (ATARAX ) tablet 25 mg  25 mg Oral TID PRN Brent, Amanda C, NP   25 mg at 07/25/23 2118   magnesium  hydroxide (MILK OF MAGNESIA) suspension 30 mL  30 mL Oral Daily PRN Brent, Amanda C, NP       nicotine  (NICODERM CQ  - dosed in mg/24 hours) patch 21 mg  21 mg Transdermal Daily Brent, Amanda C, NP   21 mg at 07/27/23 0933   risperiDONE  (RISPERDAL  M-TABS) disintegrating tablet 6 mg  6 mg Oral QHS Izediuno, Vincent A, MD   6 mg at 07/26/23 2148   traZODone  (DESYREL ) tablet 50 mg  50 mg Oral QHS PRN Brent, Amanda C, NP   50 mg at 07/26/23 2148   valbenazine  (INGREZZA ) capsule 40 mg  40 mg Oral Daily Brent, Amanda C, NP   40 mg at 07/27/23 9068    Lab Results:  Results for orders placed or performed during the hospital encounter of 07/24/23 (from the past 48 hours)  VITAMIN D  25 Hydroxy (Vit-D Deficiency, Fractures)     Status: Abnormal   Collection Time: 07/25/23  6:30 PM  Result Value Ref Range   Vit D, 25-Hydroxy 26.36 (L) 30 - 100  ng/mL    Comment: (NOTE) Vitamin D  deficiency has been defined by the Institute of Medicine  and an Endocrine Society practice guideline as a level of serum 25-OH  vitamin D  less than 20 ng/mL (1,2). The Endocrine Society went on to  further define vitamin D  insufficiency as a level between 21 and 29  ng/mL (2).  1. IOM (Institute of Medicine). 2010. Dietary reference intakes for  calcium and D. [place]: [employer]. 2. Holick MF, Binkley [place], Bischoff-Ferrari HA, et al. Evaluation,  treatment, and prevention of vitamin D  deficiency: an Endocrine  Society clinical practice guideline, JCEM. 2011 Jul; 96(7): 1911-30.  Performed at [facility], [address], [place], [place] [address]   Vitamin B12     Status: None   Collection Time: 07/25/23  6:30 PM  Result Value Ref Range   Vitamin B-12 293 180 - 914 pg/mL    Comment: (NOTE) This assay is not validated for testing neonatal or myeloproliferative syndrome specimens for Vitamin B12 levels. Performed at [facility], [address] [place], [place] [address]     Blood Alcohol level:  Lab Results  Component Value Date   [facility] <15 07/23/2023   ETH <10 04/03/2023    Metabolic Disorder Labs: Lab Results  Component Value Date   HGBA1C 5.1 04/05/2023   MPG 99.67 04/05/2023   MPG 103 06/13/2020   Lab Results  Component Value Date   PROLACTIN 96.0 (H) 07/23/2023   PROLACTIN 26.5 (H) 06/13/2020   Lab Results  Component Value Date   CHOL 156 07/23/2023   TRIG 78 07/23/2023   HDL 31 (L) 07/23/2023   CHOLHDL 5.0 07/23/2023   VLDL 16 07/23/2023   LDLCALC 109 (H) 07/23/2023   LDLCALC 108 (H) 04/05/2023    Physical Findings: AIMS:  ,  ,  ,  ,  ,  ,   CIWA:    COWS:     Musculoskeletal: Strength & Muscle Tone: within normal limits Gait & Station: normal Patient leans: N/A  Psychiatric Specialty Exam:  Presentation  General Appearance:  Casual  Eye  Contact: Fair  Speech: Clear and Coherent  Speech Volume: Normal  Handedness: Right  Mood and Affect  Mood: Anxious; Depressed  Affect: Congruent  Thought Process  Thought Processes: Coherent  Descriptions of Associations:Intact  Orientation:Full (Time, Place and Person)  Thought Content:Logical  History of Schizophrenia/Schizoaffective disorder:Yes  Duration of Psychotic Symptoms:Greater than six months  Hallucinations:Hallucinations: Auditory Description of Command Hallucinations: Hearing voices telling her to shut-up and stay in her room Description of Auditory Hallucinations: Hearing voices telling her to shut-up and stay in her room  Ideas of Reference:Paranoia  Suicidal Thoughts:Suicidal Thoughts: No  Homicidal Thoughts:Homicidal Thoughts: No (However, report feeling like she wants to fight someone called  Wanda Novak.) HI Passive Intent and/or Plan: Without Intent; Without Plan; Without Means to Carry Out; Without Access to Means  Sensorium  Memory: Immediate Fair; Recent Fair  Judgment: Fair  Insight: Fair  Art therapist  Concentration: Fair  Attention Span: Fair  Recall: [employer] of Knowledge: Fair  Language: Fair  Psychomotor Activity  Psychomotor Activity: Psychomotor Activity: Normal  Assets  Assets: Manufacturing systems engineer; Housing; Physical Health; Resilience  Sleep  Sleep: Sleep: Good Number of Hours of Sleep: 11  Physical Exam: Physical Exam Vitals and nursing note reviewed.  Constitutional:      Appearance: She is obese.  HENT:     Head: Normocephalic.     Right Ear: External ear normal.     Left Ear: External ear normal.     Nose: Nose normal.     Mouth/Throat:     Mouth: Mucous membranes are moist.     Pharynx: Oropharynx is clear.  Eyes:     Extraocular Movements: Extraocular movements intact.  Cardiovascular:     Rate and Rhythm: Normal rate.     Pulses: Normal pulses.  Pulmonary:      Effort: Pulmonary effort is normal.  Abdominal:     Comments: Deferred  Genitourinary:    Comments: Deferred Musculoskeletal:        General: Normal range of motion.     Cervical back: Normal range of motion.  Skin:    General: Skin is warm.  Neurological:     General: No focal deficit present.     Mental Status: She is alert and oriented to person, place, and time.  Psychiatric:        Mood and Affect: Mood normal.        Behavior: Behavior normal.    Review of Systems  Constitutional:  Negative for chills and fever.  HENT:  Negative for sore throat.   Eyes:  Negative for blurred vision.  Respiratory:  Negative for wheezing.   Cardiovascular:  Negative for chest pain and palpitations.  Gastrointestinal:  Negative for abdominal pain, constipation, diarrhea, heartburn, nausea and vomiting.  Genitourinary:  Negative for dysuria.  Musculoskeletal:  Negative for falls.  Skin:  Negative for itching and rash.  Neurological:  Negative for dizziness and headaches.  Endo/Heme/Allergies:        See allergy listing  Psychiatric/Behavioral:  Positive for depression and hallucinations. Negative for substance abuse and suicidal ideas. The patient is nervous/anxious. The patient does not have insomnia.    Blood pressure 104/72, pulse 96, temperature 97.7 F (36.5 C), temperature source Oral, resp. rate 18, height 6' 1 (1.854 m), weight 103.7 kg, SpO2 100%. Body mass index is 30.16 kg/m.  Treatment Plan Summary: Daily contact with patient to assess and evaluate symptoms and progress in treatment and Medication management  Assessment:  Catherine A Croak is a 28 year old African-American female with prior  psychiatric diagnoses significant for schizoaffective disorder, psychosis, paranoid schizophrenia, depression and anxiety who presents voluntarily to [facility] from [facility] behavioral health urgent care for complaint of worsening auditory and visual  hallucinations x 1 week.  Patient reports multiple inpatient psychiatric hospitalizations for her psychosis.  Last hospitalized at [facility] on April 2025.  UDS positive for marijuana.    Principal Problem:   Schizoaffective disorder (HCC)   Plans: Medications: --Initiate Haldol  10 mg po daily for psychosis -- Continue RISPERDAL  M-TABS disintegrating tablet from 6 mg p.o. at bedtime for psychosis --Continue valbenazine  (INGREZZA ) capsule 40 mg p.o. daily for prevention of EPS -- Continue hydroxyzine  tablet 25 mg p.o. 3 times daily as needed for anxiety -- Continue trazodone  tablet 50 mg p.o. at bedtime as needed for sleep   Medication for other medical problems: --Continue nicotine  patch 21 mg transdermal over 24 hours for smoking cessation --Continue albuterol  108 (90 base) microgram/ACT inhaler 1 to 2 puffs every 6 hours as needed for wheezing or shortness of breath   Other PRN Medications  -Acetaminophen  650 mg every 6 as needed/mild pain  -Maalox 30 mL oral every 4 as needed/digestion  -Magnesium  hydroxide 30 mL daily as needed/mild constipation    --The risks/benefits/side-effects/alternatives to this medication were discussed in detail with the patient and time was given for questions. The patient consents to medication trial.   -- Metabolic profile and EKG monitoring obtained while on an atypical antipsychotic (BMI: Lipid Panel: HbgA1c: QTc:)   -- Encouraged patient to participate in unit milieu and in scheduled group therapies    Continue BH Agitation Protocol  --Haldol  5 mg, oral, 3 times daily as needed, mild agitation  --Benadryl  50 mg, oral, 3 times daily as needed, mild agitation                                      OR   --Haldol  injection 5 mg, IM, 3 times daily as needed, moderate agitation  --Benadryl  injection 50 mg, IM, 3 times daily as needed, moderate agitation  --Ativan  injection 2 mg, IM, 3 times daily as needed, moderate agitation                                       OR  --Haldol  injection 10 mg, IM, 3 times daily as needed, severe agitation  --Benadryl  injection 50 mg, IM, 3 times daily as needed, severe agitation  --Ativan  injection 2 mg, IM, 3 times daily as needed, severe agitation.   Admission labs reviewed: CMP: Creatinine 1.09 elevated, otherwise normal.  Lipid profile: HDL 31 Low, LDL 109 elevated, otherwise normal.  CBC with differential: RBC 5.42 elevated, hemoglobin 16.0 elevated, HCT 46.8 elevated, otherwise normal.  Prolactin: 96.0 elevated.  Pregnancy test: Negative.  TSH: 2.311 normal.  BAL.:  Less than 15.  UDS: Positive for marijuana.   New labs ordered: Vitamin D  25-hydroxy, vitamin B12.   EKG reviewed: Normal sinus rhythm, ventricular rate 98, QT/QTc 342/436   Safety and Monitoring:  Voluntary admission to inpatient psychiatric unit for safety, stabilization and treatment  Daily contact with patient to assess and evaluate symptoms and progress in treatment  Patient's case to be discussed in multi-disciplinary team meeting  Observation Level : q15 minute checks  Vital signs: q12 hours  Precautions:  suicide, but pt currently verbally contracts for safety on unit?    Discharge Planning:  Social work and case management to assist with discharge planning and identification of hospital follow-up needs prior to discharge  Estimated LOS: 5-7?days  Discharge Concerns: Need to establish a safety plan; Medication compliance and effectiveness  Discharge Goals: Return home with outpatient referrals for mental health follow-up including medication management/psychotherapy.    Physician Treatment Plan for Secondary Diagnosis: I certify that inpatient services furnished can reasonably be expected to improve the patient's condition.    Ellouise JAYSON Azure, FNP 07/27/2023, 12:07 PM Patient ID: Heather Kramer, female   DOB: 06/17/1995, 28 y.o.   MRN: [id number]

## 2023-07-27 NOTE — Progress Notes (Signed)
   07/27/23 1016  Psych Admission Type (Psych Patients Only)  Admission Status Voluntary  Psychosocial Assessment  Patient Complaints Isolation  Eye Contact Intense  Facial Expression Flat  Affect Flat  Speech Logical/coherent  Interaction Cautious  Motor Activity Other (Comment) (Appropriate for situation)  Appearance/Hygiene Unremarkable  Behavior Characteristics Appropriate to situation  Mood Anxious  Thought Process  Coherency WDL  Content WDL  Delusions None reported or observed  Perception WDL  Hallucination None reported or observed  Judgment Poor  Confusion None  Danger to Self  Current suicidal ideation? Denies  Agreement Not to Harm Self Yes  Description of Agreement Verbal  Danger to Others  Danger to Others None reported or observed  Danger to Others Abnormal  Harmful Behavior to others No threats or harm toward other people  Destructive Behavior No threats or harm toward property

## 2023-07-27 NOTE — Plan of Care (Signed)
  Problem: Education: Goal: Knowledge of [place] [facility] Education information/materials will improve Outcome: Progressing Goal: Emotional status will improve Outcome: Progressing Goal: Mental status will improve Outcome: Progressing Goal: Verbalization of understanding the information provided will improve Outcome: Progressing   Problem: Activity: Goal: Sleeping patterns will improve Outcome: Progressing   Problem: Coping: Goal: Ability to verbalize frustrations and anger appropriately will improve Outcome: Progressing Goal: Ability to demonstrate self-control will improve Outcome: Progressing   Problem: Health Behavior/Discharge Planning: Goal: Identification of resources available to assist in meeting health care needs will improve Outcome: Progressing Goal: Compliance with treatment plan for underlying cause of condition will improve Outcome: Progressing   Problem: Physical Regulation: Goal: Ability to maintain clinical measurements within normal limits will improve Outcome: Progressing   Problem: Safety: Goal: Periods of time without injury will increase Outcome: Progressing

## 2023-07-27 NOTE — BHH Group Notes (Signed)
 Spirituality group facilitated by Kathleen Argue, BCC.  Group Description: Group focused on topic of hope. Patients participated in facilitated discussion around topic, connecting with one another around experiences and definitions for hope. Group members engaged with visual explorer photos, reflecting on what hope looks like for them today. Group engaged in discussion around how their definitions of hope are present today in hospital.  Modalities: Psycho-social ed, Adlerian, Narrative, MI  Patient Progress: Did not attend.

## 2023-07-27 NOTE — BH Assessment (Signed)
(  Sleep Hours) - 11 (Any PRNs that were needed, meds refused, or side effects to meds)- Haldol , Benadryl  with noted agitation (Any disturbances and when (visitation, over night)-Pt did not attend group (Concerns raised by the patient)-  (SI/HI/AVH)- Pt denied SI/HI; A/V/H Pt said Haldol  and benadryl  helps with A/H.

## 2023-07-27 NOTE — Group Note (Signed)
 Recreation Therapy Group Note   Group Topic:Team Building  Group Date: 07/27/2023 Start Time: 9062 End Time: 1015 Facilitators: Lucciano Vitali-McCall, LRT,CTRS Location: [address] Dayroom   Group Topic: Communication, Team Building, Problem Solving  Goal Area(s) Addresses:  Patient will effectively work with peer towards shared goal.  Patient will identify skills used to make activity successful.  Patient will share challenges and verbalize solution-driven approaches used. Patient will identify how skills used during activity can be used to reach post d/c goals.   Behavioral Response:   Intervention: STEM Activity   Activity: [employer]. Patients were provided the following materials: 5 drinking straws, 5 rubber bands, 5 paper clips, 2 index cards and 2 drinking cups. Using the provided materials patients were asked to build a launching mechanism to launch a ping pong ball across the room, approximately 10 feet. Patients were divided into teams of 3-5. Instructions required all materials be incorporated into the device, functionality of items left to the peer group's discretion.  Education: Pharmacist, community, Scientist, physiological, Air cabin crew, Building control surveyor.   Education Outcome: Acknowledges education/In group clarification offered/Needs additional education.    Affect/Mood: N/A   Participation Level: Did not attend    Clinical Observations/Individualized Feedback:     Plan: Continue to engage patient in RT group sessions 2-3x/week.   Verlena Marlette-McCall, LRT,CTRS 07/27/2023 1:21 PM

## 2023-07-28 NOTE — Care Management Important Message (Signed)
 Medicare IM printed and given to social work to give to the patient prior to discharge.

## 2023-07-28 NOTE — [facility] Suicide Risk Assessment (Signed)
 [facility] INPATIENT:  Family/Significant Other Suicide Prevention Education  Suicide Prevention Education:  Education Completed; Mother, Heather Kramer [phone],  (name of family member/significant other) has been identified by the patient as the family member/significant other with whom the patient will be residing, and identified as the person(s) who will aid the patient in the event of a mental health crisis (suicidal ideations/suicide attempt).  With written consent from the patient, the family member/significant other has been provided the following suicide prevention education, prior to the and/or following the discharge of the patient.  Mother reports pt started smoking deuce (K2) at age 28. [place]. Was taking an Invega injection but it made her gain weight so psychiatrist took her off. States meds are filled on time and pt reports taking them but mom is not sure. Also smoking 30 cigarettes daily.   [facility] does come to see pt regularly however they do not have therapist on staff currently and mother is requesting pt to see a therapist if possible at discharge. Does not have access to weapons or firearms. Mother and pt's aunt are very involved in patient's life and keep up with her mental health.   Mother or aunt will pick patient up at discharge.  Aunt came to visit last night, pt appeared much calmer. However, according to aunt pt was asking questions about how much money she made and if it was more than mother. When pt has conversations like this, she is not at baseline.  Mother plans to visit again Friday evening.  The suicide prevention education provided includes the following: Suicide risk factors Suicide prevention and interventions National Suicide Hotline telephone number [facility] assessment telephone number [facility] Emergency Assistance 911 [facility] and/or Residential Mobile Crisis Unit telephone  number  Request made of family/significant other to: Remove weapons (e.g., guns, rifles, knives), all items previously/currently identified as safety concern.   Remove drugs/medications (over-the-counter, prescriptions, illicit drugs), all items previously/currently identified as a safety concern.  The family member/significant other verbalizes understanding of the suicide prevention education information provided.  The family member/significant other agrees to remove the items of safety concern listed above.  Heather Kramer 07/28/2023, 10:49 AM

## 2023-07-28 NOTE — Group Note (Unsigned)
 Date:  07/29/2023 Time:  4:37 AM  Group Topic/Focus:  Wrap-Up Group:   The focus of this group is to help patients review their daily goal of treatment and discuss progress on daily workbooks.    Participation Level:  Active  Participation Quality:  Appropriate and Attentive  Affect:  Appropriate  Cognitive:  Alert and Appropriate  Insight: Appropriate and Good  Engagement in Group:  Engaged  Modes of Intervention:  Discussion and Education  Additional Comments:  Pt attended wrap up group and rated their day a 5/10. Pt states that they haven't gone to any groups and states that they have no goals besides sleeping and being D/C. Pt has no concerns to relay at this time.   Rosella DELENA Pouch 07/29/2023, 4:37 AM

## 2023-07-28 NOTE — Plan of Care (Signed)
   Problem: Activity: Goal: Interest or engagement in activities will improve Outcome: Progressing   Problem: Health Behavior/Discharge Planning: Goal: Compliance with treatment plan for underlying cause of condition will improve Outcome: Progressing   Problem: Safety: Goal: Periods of time without injury will increase Outcome: Progressing

## 2023-07-28 NOTE — Progress Notes (Addendum)
 [facility] MD Progress Note  07/28/2023 3:34 PM Heather Kramer  MRN:  [id number]  Principal Problem: Schizoaffective disorder (HCC) Diagnosis: Principal Problem:   Schizoaffective disorder (HCC)  Reason for admission:  Heather Kramer is a 28 year old African-American female with prior psychiatric diagnoses significant for schizoaffective disorder, psychosis, paranoid schizophrenia, depression and anxiety who presents voluntarily to [facility] from [facility] behavioral health urgent care for complaint of worsening auditory and visual hallucinations x 1 week.  Patient reports multiple inpatient psychiatric hospitalizations for her psychosis.  Last hospitalized at [facility] on April 2025.  UDS positive for marijuana.  24-hour chart review: Patient Case discussed in the interdisciplinary meeting.  Vital signs reviewed without critical values.  No Agitation protocol required.  Trazodone  x 1 required for insomnia.  Today's assessment notes: Heather Kramer reports that her mood is not depressed, however, reports anxiety of 4/10.  Patient presents alert and oriented to person, place, time and situation. Chart reviewed and findings shared with the treatment team and consult with attending psychiatrist with recommendation to continue Risperdal  disintegrating tablets 6 mg p.o. daily at bedtime, Ingrezza  capsule 40 mg p.o. daily, and Haldol  10 mg po at bedtime for residual hallucinations. No EPS, or cogwheel rigidity noted during this assessment. Nursing staff report patient sleeping over 9.5 hours last night.  Appetite is good.  Concentration is improving. Energy level is adequate. Reports she had a shower today and feeling good. Patient denies suicidal thoughts , HI, or visual hallucination.  Further denies suicidal intent and plan. Encouraged to attend therapeutic milieu and unit group activities. Denies having side effects to current psychiatric medications.    Total Time spent with patient: 45 minutes  Past Psychiatric History: Previous Psych Diagnoses: Schizoaffective disorder, depression, anxiety, marijuana dependence, paranoid schizophrenia, psychosis Prior inpatient treatment: Patient report several inpatient psychiatric admissions.  Last admission to [facility] April 2025 Current/prior outpatient treatment: Yes, patient is being followed by ACT team from [facility].  Last seen by [facility] Team on 07/22/2023 Prior rehab hx: Denies Psychotherapy hx: Yes History of suicide: Denies History of homicide or aggression: Denies, however patient states, I feel like I want to fight with somebody. Psychiatric medication history: Yes Psychiatric medication compliance history: Compliance, however, mom thinks patient is not compliant with medications. Neuromodulation history: Denies Current Psychiatrist: Yes, Dr. Malinda from [facility] Current therapist: None  Past Medical History:  Past Medical History:  Diagnosis Date   Acquired acanthosis nigricans    Allergies    Asthma    Goiter    Goiter    Overweight(278.02)    Pre-diabetes    Schizophrenia (HCC)    Thyroiditis, autoimmune     Past Surgical History:  Procedure Laterality Date   MOUTH SURGERY     Family History:  Family History  Problem Relation Age of Onset   Obesity Mother    Heart disease Father    Cancer Maternal Grandmother    Diabetes Maternal Grandfather    Family Psychiatric  History: See H&P Social History:  Social History   Substance and Sexual Activity  Alcohol Use Yes   Comment: occasionally     Social History   Substance and Sexual Activity  Drug Use Not Currently   Types: Marijuana    Social History   Socioeconomic History   Marital status: Single    Spouse name: Not on file   Number of children: Not on file   Years of education: Not on file  Highest education level: 12th grade  Occupational History   Not on file  Tobacco Use   Smoking  status: Every Day    Current packs/day: 2.00    Types: Cigarettes   Smokeless tobacco: Never  Vaping Use   Vaping status: Never Used  Substance and Sexual Activity   Alcohol use: Yes    Comment: occasionally   Drug use: Not Currently    Types: Marijuana   Sexual activity: Not Currently  Other Topics Concern   Not on file  Social History Narrative   Lives with mother and brother. 9th grade at [employer]. Plays basketball.    Social Drivers of Corporate investment banker Strain: Not on File (10/14/2021)   Received from [facility]    Financial Resource Strain: 0  Food Insecurity: No Food Insecurity (07/24/2023)   Hunger Vital Sign    Worried About Running Out of Food in the Last Year: Never true    Ran Out of Food in the Last Year: Never true  Transportation Needs: No Transportation Needs (07/24/2023)   PRAPARE - Administrator, Civil Service (Medical): No    Lack of Transportation (Non-Medical): No  Physical Activity: Not on File (10/14/2021)   Received from [facility]   Physical Activity    Physical Activity: 0  Stress: Not on File (10/14/2021)   Received from [facility]   Stress    Stress: 0  Social Connections: Not on File (09/20/2022)   Received from [facility]   Social Connections    Connectedness: 0   Additional Social History:   Sleep: Good Estimated Sleeping Duration (Last 24 Hours): 7.00-8.25 hours  Appetite:  Good  Current Medications: Current Facility-Administered Medications  Medication Dose Route Frequency Provider Last Rate Last Admin   acetaminophen  (TYLENOL ) tablet 650 mg  650 mg Oral Q6H PRN Brent, Amanda C, NP       albuterol  (VENTOLIN  HFA) 108 (90 Base) MCG/ACT inhaler 1-2 puff  1-2 puff Inhalation Q6H PRN Brent, Amanda C, NP       alum & mag hydroxide-simeth (MAALOX/MYLANTA) 200-200-20 MG/5ML suspension 30 mL  30 mL Oral Q4H PRN Brent, Amanda C, NP       haloperidol  (HALDOL ) tablet 5 mg  5 mg Oral TID PRN  Brent, Amanda C, NP   5 mg at 07/26/23 2148   And   diphenhydrAMINE  (BENADRYL ) capsule 50 mg  50 mg Oral TID PRN Brent, Amanda C, NP   50 mg at 07/26/23 2148   haloperidol  lactate (HALDOL ) injection 5 mg  5 mg Intramuscular TID PRN Brent, Amanda C, NP       And   diphenhydrAMINE  (BENADRYL ) injection 50 mg  50 mg Intramuscular TID PRN Brent, Amanda C, NP       And   LORazepam  (ATIVAN ) injection 2 mg  2 mg Intramuscular TID PRN Brent, Amanda C, NP       haloperidol  lactate (HALDOL ) injection 10 mg  10 mg Intramuscular TID PRN Thresa Alan BROCKS, NP       And   diphenhydrAMINE  (BENADRYL ) injection 50 mg  50 mg Intramuscular TID PRN Brent, Amanda C, NP       And   LORazepam  (ATIVAN ) injection 2 mg  2 mg Intramuscular TID PRN Brent, Amanda C, NP       haloperidol  (HALDOL ) tablet 10 mg  10 mg Oral QHS Veronia Laprise C, FNP   10 mg at 07/27/23 2216  hydrOXYzine  (ATARAX ) tablet 25 mg  25 mg Oral TID PRN Brent, Amanda C, NP   25 mg at 07/25/23 2118   magnesium  hydroxide (MILK OF MAGNESIA) suspension 30 mL  30 mL Oral Daily PRN Brent, Amanda C, NP       nicotine  (NICODERM CQ  - dosed in mg/24 hours) patch 21 mg  21 mg Transdermal Daily Brent, Amanda C, NP   21 mg at 07/28/23 9157   risperiDONE  (RISPERDAL  M-TABS) disintegrating tablet 6 mg  6 mg Oral QHS Bobie Kistler C, FNP   6 mg at 07/27/23 2216   traZODone  (DESYREL ) tablet 50 mg  50 mg Oral QHS PRN Brent, Amanda C, NP   50 mg at 07/27/23 2217   valbenazine  (INGREZZA ) capsule 40 mg  40 mg Oral Daily Brent, Amanda C, NP   40 mg at 07/28/23 9157    Lab Results:  Results for orders placed or performed during the hospital encounter of 07/24/23 (from the past 48 hours)  VITAMIN D  25 Hydroxy (Vit-D Deficiency, Fractures)     Status: None   Collection Time: 07/27/23  6:21 PM  Result Value Ref Range   Vit D, 25-Hydroxy 30.81 30 - 100 ng/mL    Comment: (NOTE) Vitamin D  deficiency has been defined by the Institute of Medicine  and an Endocrine Society practice  guideline as a level of serum 25-OH  vitamin D  less than 20 ng/mL (1,2). The Endocrine Society went on to  further define vitamin D  insufficiency as a level between 21 and 29  ng/mL (2).  1. IOM (Institute of Medicine). 2010. Dietary reference intakes for  calcium and D. [place]: [employer]. 2. Holick MF, Binkley Whitehall, Bischoff-Ferrari HA, et al. Evaluation,  treatment, and prevention of vitamin D  deficiency: an Endocrine  Society clinical practice guideline, JCEM. 2011 Jul; 96(7): 1911-30.  Performed at [facility], [address], [place], [place] [address]   Vitamin B12     Status: None   Collection Time: 07/27/23  6:21 PM  Result Value Ref Range   Vitamin B-12 280 180 - 914 pg/mL    Comment: (NOTE) This assay is not validated for testing neonatal or myeloproliferative syndrome specimens for Vitamin B12 levels. Performed at [facility], [address] [place], [place] [address]     Blood Alcohol level:  Lab Results  Component Value Date   [facility] <15 07/23/2023   ETH <10 04/03/2023    Metabolic Disorder Labs: Lab Results  Component Value Date   HGBA1C 5.1 04/05/2023   MPG 99.67 04/05/2023   MPG 103 06/13/2020   Lab Results  Component Value Date   PROLACTIN 96.0 (H) 07/23/2023   PROLACTIN 26.5 (H) 06/13/2020   Lab Results  Component Value Date   CHOL 156 07/23/2023   TRIG 78 07/23/2023   HDL 31 (L) 07/23/2023   CHOLHDL 5.0 07/23/2023   VLDL 16 07/23/2023   LDLCALC 109 (H) 07/23/2023   LDLCALC 108 (H) 04/05/2023    Physical Findings: AIMS:  ,  ,  ,  ,  ,  ,   CIWA:    COWS:     Musculoskeletal: Strength & Muscle Tone: within normal limits Gait & Station: normal Patient leans: N/A  Psychiatric Specialty Exam:  Presentation  General Appearance:  Appropriate for Environment; Casual  Eye Contact: Fair  Speech: Clear and Coherent  Speech Volume: Normal  Handedness: Right  Mood and Affect   Mood: Anxious; Depressed  Affect:  Congruent  Thought Process  Thought Processes: Coherent; Linear  Descriptions of Associations:Intact  Orientation:Full (Time, Place and Person)  Thought Content:Logical; Paranoid Ideation  History of Schizophrenia/Schizoaffective disorder:Yes  Duration of Psychotic Symptoms:Greater than six months  Hallucinations:Hallucinations: Auditory Description of Command Hallucinations: He has been minimal voices telling her to shut up Description of Auditory Hallucinations: He has been minimal voices telling her to shut up.  Ideas of Reference:Paranoia  Suicidal Thoughts:Suicidal Thoughts: No  Homicidal Thoughts:Homicidal Thoughts: No HI Passive Intent and/or Plan: Without Intent; Without Plan; Without Means to Carry Out; Without Access to Means  Sensorium  Memory: Immediate Fair; Recent Fair  Judgment: Fair  Insight: Fair  Executive Functions  Concentration: Good  Attention Span: Good  Recall: [employer] of Knowledge: Fair  Language: Fair  Psychomotor Activity  Psychomotor Activity: Psychomotor Activity: Normal  Assets  Assets: Communication Skills; Physical Health; Resilience  Sleep  Sleep: Sleep: Good Number of Hours of Sleep: 9.5  Physical Exam: Physical Exam Vitals and nursing note reviewed.  Constitutional:      Appearance: She is obese.  HENT:     Head: Normocephalic.     Right Ear: External ear normal.     Left Ear: External ear normal.     Nose: Nose normal.     Mouth/Throat:     Mouth: Mucous membranes are moist.     Pharynx: Oropharynx is clear.  Eyes:     Extraocular Movements: Extraocular movements intact.  Cardiovascular:     Rate and Rhythm: Normal rate.     Pulses: Normal pulses.  Pulmonary:     Effort: Pulmonary effort is normal.  Abdominal:     Comments: Deferred  Genitourinary:    Comments: Deferred Musculoskeletal:        General: Normal range of motion.     Cervical back:  Normal range of motion.  Skin:    General: Skin is warm.  Neurological:     General: No focal deficit present.     Mental Status: She is alert and oriented to person, place, and time.  Psychiatric:        Mood and Affect: Mood normal.        Behavior: Behavior normal.    Review of Systems  Constitutional:  Negative for chills and fever.  HENT:  Negative for sore throat.   Eyes:  Negative for blurred vision.  Respiratory:  Negative for wheezing.   Cardiovascular:  Negative for chest pain and palpitations.  Gastrointestinal:  Negative for abdominal pain, constipation, diarrhea, heartburn, nausea and vomiting.  Genitourinary:  Negative for dysuria.  Musculoskeletal:  Negative for falls.  Skin:  Negative for itching and rash.  Neurological:  Negative for dizziness and headaches.  Endo/Heme/Allergies:        See allergy listing  Psychiatric/Behavioral:  Positive for depression and hallucinations. Negative for substance abuse and suicidal ideas. The patient is nervous/anxious. The patient does not have insomnia.    Blood pressure 127/82, pulse 73, temperature 97.7 F (36.5 C), temperature source Oral, resp. rate 16, height 6' 1 (1.854 m), weight 103.7 kg, SpO2 100%. Body mass index is 30.16 kg/m.  Treatment Plan Summary: Daily contact with patient to assess and evaluate symptoms and progress in treatment and Medication management  Assessment:  Sydell A Jacot is a 28 year old African-American female with prior psychiatric diagnoses significant for schizoaffective disorder, psychosis, paranoid schizophrenia, depression and anxiety who presents voluntarily to [facility] from [facility] behavioral health urgent care for  complaint of worsening auditory and visual hallucinations x 1 week.  Patient reports multiple inpatient psychiatric hospitalizations for her psychosis.  Last hospitalized at [facility] on April 2025.  UDS positive for  marijuana.    Principal Problem:   Schizoaffective disorder (HCC)   Plans: Medications: --Continue Haldol  10 mg po daily for psychosis -- Continue RISPERDAL  M-TABS disintegrating tablet from 6 mg p.o. at bedtime for psychosis --Continue valbenazine  (INGREZZA ) capsule 40 mg p.o. daily for prevention of EPS -- Continue hydroxyzine  tablet 25 mg p.o. 3 times daily as needed for anxiety -- Continue trazodone  tablet 50 mg p.o. at bedtime as needed for sleep   Medication for other medical problems: --Continue nicotine  patch 21 mg transdermal over 24 hours for smoking cessation --Continue albuterol  108 (90 base) microgram/ACT inhaler 1 to 2 puffs every 6 hours as needed for wheezing or shortness of breath   Other PRN Medications  -Acetaminophen  650 mg every 6 as needed/mild pain  -Maalox 30 mL oral every 4 as needed/digestion  -Magnesium  hydroxide 30 mL daily as needed/mild constipation    --The risks/benefits/side-effects/alternatives to this medication were discussed in detail with the patient and time was given for questions. The patient consents to medication trial.   -- Metabolic profile and EKG monitoring obtained while on an atypical antipsychotic (BMI: Lipid Panel: HbgA1c: QTc:)   -- Encouraged patient to participate in unit milieu and in scheduled group therapies    Continue BH Agitation Protocol  --Haldol  5 mg, oral, 3 times daily as needed, mild agitation  --Benadryl  50 mg, oral, 3 times daily as needed, mild agitation                                      OR   --Haldol  injection 5 mg, IM, 3 times daily as needed, moderate agitation  --Benadryl  injection 50 mg, IM, 3 times daily as needed, moderate agitation  --Ativan  injection 2 mg, IM, 3 times daily as needed, moderate agitation                                      OR  --Haldol  injection 10 mg, IM, 3 times daily as needed, severe agitation  --Benadryl  injection 50 mg, IM, 3 times daily as needed, severe agitation  --Ativan   injection 2 mg, IM, 3 times daily as needed, severe agitation.   Admission labs reviewed: CMP: Creatinine 1.09 elevated, otherwise normal.  Lipid profile: HDL 31 Low, LDL 109 elevated, otherwise normal.  CBC with differential: RBC 5.42 elevated, hemoglobin 16.0 elevated, HCT 46.8 elevated, otherwise normal.  Prolactin: 96.0 elevated.  Pregnancy test: Negative.  TSH: 2.311 normal.  BAL.:  Less than 15.  UDS: Positive for marijuana.   New labs ordered: Vitamin D  25-hydroxy, vitamin B12.   EKG reviewed: Normal sinus rhythm, ventricular rate 98, QT/QTc 342/436   Safety and Monitoring:  Voluntary admission to inpatient psychiatric unit for safety, stabilization and treatment  Daily contact with patient to assess and evaluate symptoms and progress in treatment  Patient's case to be discussed in multi-disciplinary team meeting  Observation Level : q15 minute checks  Vital signs: q12 hours  Precautions: suicide, but pt currently verbally contracts for safety on unit?    Discharge Planning:  Social work and case management to assist with discharge planning and identification of  hospital follow-up needs prior to discharge  Estimated LOS: 5-7?days  Discharge Concerns: Need to establish a safety plan; Medication compliance and effectiveness  Discharge Goals: Return home with outpatient referrals for mental health follow-up including medication management/psychotherapy.    Physician Treatment Plan for Secondary Diagnosis: I certify that inpatient services furnished can reasonably be expected to improve the patient's condition.    Ellouise JAYSON Azure, FNP 07/28/2023, 3:34 PM Patient ID: [name], female   DOB: 05/10/1995, 28 y.o.   MRN: [id number] Patient ID: [name], female   DOB: Feb 02, 1995, 28 y.o.   MRN: [id number]

## 2023-07-28 NOTE — Group Note (Signed)
 LCSW Group Therapy Note   Group Date: 07/28/2023 Start Time: 1100 End Time: 1200   Participation:  did not attend  Type of Therapy:  Group Therapy  Topic:   Healing Flames: Navigating Anger with Compassion  Objective:  Foster self-awareness and promote compassion toward oneself and others when dealing with anger.  Goals:  Help participants understand the underlying emotions and needs fueling anger. Provide coping strategies for healthier emotional expression and anger management.  Summary: This session explored anger as a volcano - an explosion driven by deeper feelings and unmet needs. Participants learned to identify anger triggers and underlying emotions, then practiced coping strategies like deep breathing, physical activity, and journaling. The group discussed healthy ways to manage anger before it escalates, using both personal reflection and shared experiences.  Therapeutic Modalities: Cognitive Behavioral Therapy (CBT): Challenging thoughts that fuel anger. Mindfulness: Increasing awareness of emotions and sensations.   Aradhana Gin O Ebbie Sorenson, LCSWA 07/28/2023  6:16 PM
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
## 2023-07-28 NOTE — Progress Notes (Signed)
 Patient presents with flat affect and currently denies SI,HI, and A/V/H with no plan or intent. Patient is med compliant. Patient did not attend goals group but was observed out in milieu interacting. Patient remains cooperative at this time.

## 2023-07-28 NOTE — Progress Notes (Addendum)
 Shift Note   (Sleep Hours) - 9.5  (Any PRNs that were needed, meds refused, or side effects to meds)- Given PRN Trazodone  for insomnia  (Any disturbances and when (visitation, over night)- Pt slept majority of the time between 7pm-10pm. She woke up for her meds and went back to sleep.  (Concerns raised by the patient)- None  (SI/HI/AVH)- Denies SI/HI/AVH

## 2023-07-28 NOTE — Progress Notes (Signed)
Pt did not attend goals group. 

## 2023-07-29 ENCOUNTER — Encounter (HOSPITAL_COMMUNITY): Payer: Self-pay

## 2023-07-29 NOTE — Care Management Important Message (Signed)
 Patient informed of right to appeal discharge, provided [facility] number to [facility]. Patient expressed no interest in appealing discharge at this time. CSW will continue to monitor situation.

## 2023-07-29 NOTE — [facility] Group Notes (Signed)
 Psychoeducational Group Note  Date:  07/29/2023 Time:  2000  Group Topic/Focus:  Alcoholics Anonymous Meeting  Participation Level: Did Not Attend  Participation Quality:  Not Applicable  Affect:  Not Applicable  Cognitive:  Not Applicable  Insight:  Not Applicable  Engagement in Group: Not Applicable  Additional Comments:  Did not attend.   Lenora Shaver S 07/29/2023, 11:03 PM

## 2023-07-29 NOTE — Progress Notes (Signed)
 [facility] MD Progress Note  07/29/2023 11:43 AM BRIANN SARCHET  MRN:  [id number]  Principal Problem: Schizoaffective disorder (HCC) Diagnosis: Principal Problem:   Schizoaffective disorder (HCC)  Reason for admission:  Nusrat A Conkel is a 28 year old African-American female with prior psychiatric diagnoses significant for schizoaffective disorder, psychosis, paranoid schizophrenia, depression and anxiety who presents voluntarily to [facility] from [facility] behavioral health urgent care for complaint of worsening auditory and visual hallucinations x 1 week.  Patient reports multiple inpatient psychiatric hospitalizations for her psychosis.  Last hospitalized at [facility] on April 2025.  UDS positive for marijuana.  24-hour chart review: Patient Case discussed in the interdisciplinary meeting.  Vital signs reviewed without critical values.  No Agitation protocol required.  Trazodone  x 1 required for insomnia, and hydroxyzine  x 1 required for anxiety.  Today's assessment notes: On assessment today, Tinzley reports that her mood is not depressed.  She reports anxiety is not manageable level.  Patient presents alert and oriented to person, place, time and situation. Chart reviewed and findings shared with the treatment team and consult with attending psychiatrist with recommendation to continue her current treatment regimen as she has responded to recent medication adjustments. Hallucination is gradually resolving, as patient reports she does not hear voices or have visual hallucination x 48 hours.  We will continue to monitor and evaluate her further.  Denies having side effects to current psychiatric medications. No EPS, or cogwheel rigidity noted during this assessment. Nursing staff report patient sleeping over 8.5 hours last night.  Appetite is good.  Concentration is improving. Energy level is adequate.  Attention to hygiene is fair.  Patient  denies suicidal thoughts , HI, or visual hallucination.  Further denies suicidal intent and plan. Encouraged to attend therapeutic milieu and unit group activities.  Total Time spent with patient: 45 minutes  Past Psychiatric History: Previous Psych Diagnoses: Schizoaffective disorder, depression, anxiety, marijuana dependence, paranoid schizophrenia, psychosis Prior inpatient treatment: Patient report several inpatient psychiatric admissions.  Last admission to [facility] April 2025 Current/prior outpatient treatment: Yes, patient is being followed by ACT team from [facility].  Last seen by ACT Team on 07/22/2023 Prior rehab hx: Denies Psychotherapy hx: Yes History of suicide: Denies History of homicide or aggression: Denies, however patient states, I feel like I want to fight with somebody. Psychiatric medication history: Yes Psychiatric medication compliance history: Compliance, however, mom thinks patient is not compliant with medications. Neuromodulation history: Denies Current Psychiatrist: Yes, Dr. Malinda from [facility] Current therapist: None  Past Medical History:  Past Medical History:  Diagnosis Date   Acquired acanthosis nigricans    Allergies    Asthma    Goiter    Goiter    Overweight(278.02)    Pre-diabetes    Schizophrenia (HCC)    Thyroiditis, autoimmune     Past Surgical History:  Procedure Laterality Date   MOUTH SURGERY     Family History:  Family History  Problem Relation Age of Onset   Obesity Mother    Heart disease Father    Cancer Maternal Grandmother    Diabetes Maternal Grandfather    Family Psychiatric  History: See H&P Social History:  Social History   Substance and Sexual Activity  Alcohol Use Yes   Comment: occasionally     Social History   Substance and Sexual Activity  Drug Use Not Currently   Types: Marijuana    Social History   Socioeconomic History  Marital status: Single    Spouse name: Not on file   Number  of children: Not on file   Years of education: Not on file   Highest education level: 12th grade  Occupational History   Not on file  Tobacco Use   Smoking status: Every Day    Current packs/day: 2.00    Types: Cigarettes   Smokeless tobacco: Never  Vaping Use   Vaping status: Never Used  Substance and Sexual Activity   Alcohol use: Yes    Comment: occasionally   Drug use: Not Currently    Types: Marijuana   Sexual activity: Not Currently  Other Topics Concern   Not on file  Social History Narrative   Lives with mother and brother. 9th grade at [employer]. Plays basketball.    Social Drivers of Corporate investment banker Strain: Not on File (10/14/2021)   Received from [facility]    Financial Resource Strain: 0  Food Insecurity: No Food Insecurity (07/24/2023)   Hunger Vital Sign    Worried About Running Out of Food in the Last Year: Never true    Ran Out of Food in the Last Year: Never true  Transportation Needs: No Transportation Needs (07/24/2023)   PRAPARE - Administrator, Civil Service (Medical): No    Lack of Transportation (Non-Medical): No  Physical Activity: Not on File (10/14/2021)   [facility]   Physical Activity    Physical Activity: 0  Stress: Not on File (10/14/2021)   Received from [facility]   Stress    Stress: 0  Social Connections: Not on File (09/20/2022)   Received from [facility]   Social Connections    Connectedness: 0   Additional Social History:   Sleep: Good Estimated Sleeping Duration (Last 24 Hours): 6.75-8.00 hours  Appetite:  Good  Current Medications: Current Facility-Administered Medications  Medication Dose Route Frequency Provider Last Rate Last Admin   acetaminophen  (TYLENOL ) tablet 650 mg  650 mg Oral Q6H PRN Brent, Amanda C, NP       albuterol  (VENTOLIN  HFA) 108 (90 Base) MCG/ACT inhaler 1-2 puff  1-2 puff Inhalation Q6H PRN Brent, Amanda C, NP       alum & mag  hydroxide-simeth (MAALOX/MYLANTA) 200-200-20 MG/5ML suspension 30 mL  30 mL Oral Q4H PRN Brent, Amanda C, NP       haloperidol  (HALDOL ) tablet 5 mg  5 mg Oral TID PRN Brent, Amanda C, NP   5 mg at 07/26/23 2148   And   diphenhydrAMINE  (BENADRYL ) capsule 50 mg  50 mg Oral TID PRN Brent, Amanda C, NP   50 mg at 07/26/23 2148   haloperidol  lactate (HALDOL ) injection 5 mg  5 mg Intramuscular TID PRN Thresa Alan BROCKS, NP       And   diphenhydrAMINE  (BENADRYL ) injection 50 mg  50 mg Intramuscular TID PRN Brent, Amanda C, NP       And   LORazepam  (ATIVAN ) injection 2 mg  2 mg Intramuscular TID PRN Brent, Amanda C, NP       haloperidol  lactate (HALDOL ) injection 10 mg  10 mg Intramuscular TID PRN Thresa Alan BROCKS, NP       And   diphenhydrAMINE  (BENADRYL ) injection 50 mg  50 mg Intramuscular TID PRN Brent, Amanda C, NP       And   LORazepam  (ATIVAN ) injection 2 mg  2 mg Intramuscular TID PRN Brent, Amanda C, NP  haloperidol  (HALDOL ) tablet 10 mg  10 mg Oral QHS Madesyn Ast C, FNP   10 mg at 07/28/23 2111   hydrOXYzine  (ATARAX ) tablet 25 mg  25 mg Oral TID PRN Brent, Amanda C, NP   25 mg at 07/28/23 2111   magnesium  hydroxide (MILK OF MAGNESIA) suspension 30 mL  30 mL Oral Daily PRN Brent, Amanda C, NP       nicotine  (NICODERM CQ  - dosed in mg/24 hours) patch 21 mg  21 mg Transdermal Daily Brent, Amanda C, NP   21 mg at 07/29/23 9182   risperiDONE  (RISPERDAL  M-TABS) disintegrating tablet 6 mg  6 mg Oral QHS Tayja Manzer C, FNP   6 mg at 07/28/23 2111   traZODone  (DESYREL ) tablet 50 mg  50 mg Oral QHS PRN Brent, Amanda C, NP   50 mg at 07/28/23 2111   valbenazine  (INGREZZA ) capsule 40 mg  40 mg Oral Daily Brent, Amanda C, NP   40 mg at 07/29/23 9181   Lab Results:  Results for orders placed or performed during the hospital encounter of 07/24/23 (from the past 48 hours)  VITAMIN D  25 Hydroxy (Vit-D Deficiency, Fractures)     Status: None   Collection Time: 07/27/23  6:21 PM  Result Value Ref Range    Vit D, 25-Hydroxy 30.81 30 - 100 ng/mL    Comment: (NOTE) Vitamin D  deficiency has been defined by the Institute of Medicine  and an Endocrine Society practice guideline as a level of serum 25-OH  vitamin D  less than 20 ng/mL (1,2). The Endocrine Society went on to  further define vitamin D  insufficiency as a level between 21 and 29  ng/mL (2).  1. IOM (Institute of Medicine). 2010. Dietary reference intakes for  calcium and D. [place]: [employer]. 2. Holick MF, Binkley [place], Bischoff-Ferrari HA, et al. Evaluation,  treatment, and prevention of vitamin D  deficiency: an Endocrine  Society clinical practice guideline, JCEM. 2011 Jul; 96(7): 1911-30.  Performed at [facility], [address] [place], [place] [address]   Vitamin B12     Status: None   Collection Time: 07/27/23  6:21 PM  Result Value Ref Range   Vitamin B-12 280 180 - 914 pg/mL    Comment: (NOTE) This assay is not validated for testing neonatal or myeloproliferative syndrome specimens for Vitamin B12 levels. Performed at [facility], [address], [place], [place] [address]    Blood Alcohol level:  Lab Results  Component Value Date   [facility] <15 07/23/2023   ETH <10 04/03/2023   Metabolic Disorder Labs: Lab Results  Component Value Date   HGBA1C 5.1 04/05/2023   MPG 99.67 04/05/2023   MPG 103 06/13/2020   Lab Results  Component Value Date   PROLACTIN 96.0 (H) 07/23/2023   PROLACTIN 26.5 (H) 06/13/2020   Lab Results  Component Value Date   CHOL 156 07/23/2023   TRIG 78 07/23/2023   HDL 31 (L) 07/23/2023   CHOLHDL 5.0 07/23/2023   VLDL 16 07/23/2023   LDLCALC 109 (H) 07/23/2023   LDLCALC 108 (H) 04/05/2023   Physical Findings: AIMS:  ,  ,  ,  ,  ,  ,   CIWA:    COWS:     Musculoskeletal: Strength & Muscle Tone: within normal limits Gait & Station: normal Patient leans: N/A  Psychiatric Specialty Exam:  Presentation  General Appearance:   Appropriate for Environment; Casual; Fairly Groomed  Eye Contact: Good  Speech: Clear and Coherent  Speech Volume: Normal  Handedness: Right  Mood and Affect  Mood: Euthymic  Affect: Congruent  Thought Process  Thought Processes: Coherent  Descriptions of Associations:Intact  Orientation:Full (Time, Place and Person)  Thought Content:Logical  History of Schizophrenia/Schizoaffective disorder:Yes  Duration of Psychotic Symptoms:Greater than six months  Hallucinations:Hallucinations: None Description of Command Hallucinations: Denies Description of Auditory Hallucinations: Denies  Ideas of Reference:Paranoia  Suicidal Thoughts:Suicidal Thoughts: No  Homicidal Thoughts:Homicidal Thoughts: No HI Passive Intent and/or Plan: -- (Denies)  Sensorium  Memory: Immediate Fair; Recent Fair  Judgment: Fair  Insight: Fair  Executive Functions  Concentration: Good  Attention Span: Good  Recall: Fair  Fund of Knowledge: Fair  Language: Fair  Psychomotor Activity  Psychomotor Activity: Psychomotor Activity: Normal  Assets  Assets: Communication Skills; Physical Health; Resilience  Sleep  Sleep: Sleep: Good Number of Hours of Sleep: 8.5  Physical Exam: Physical Exam Vitals and nursing note reviewed.  Constitutional:      Appearance: She is obese.  HENT:     Head: Normocephalic.     Right Ear: External ear normal.     Left Ear: External ear normal.     Nose: Nose normal.     Mouth/Throat:     Mouth: Mucous membranes are moist.     Pharynx: Oropharynx is clear.  Eyes:     Extraocular Movements: Extraocular movements intact.  Cardiovascular:     Rate and Rhythm: Normal rate.     Pulses: Normal pulses.  Pulmonary:     Effort: Pulmonary effort is normal.  Abdominal:     Comments: Deferred  Genitourinary:    Comments: Deferred Musculoskeletal:        General: Normal range of motion.     Cervical back: Normal range of motion.   Skin:    General: Skin is warm.  Neurological:     General: No focal deficit present.     Mental Status: She is alert and oriented to person, place, and time.  Psychiatric:        Mood and Affect: Mood normal.        Behavior: Behavior normal.        Thought Content: Thought content normal.    Review of Systems  Constitutional:  Negative for chills and fever.  HENT:  Negative for sore throat.   Eyes:  Negative for blurred vision.  Respiratory:  Negative for wheezing.   Cardiovascular:  Negative for chest pain and palpitations.  Gastrointestinal:  Negative for abdominal pain, constipation, diarrhea, heartburn, nausea and vomiting.  Genitourinary:  Negative for dysuria.  Musculoskeletal:  Negative for falls.  Skin:  Negative for itching and rash.  Neurological:  Negative for dizziness and headaches.  Endo/Heme/Allergies:        See allergy listing  Psychiatric/Behavioral:  Positive for depression and hallucinations. Negative for substance abuse and suicidal ideas. The patient is nervous/anxious. The patient does not have insomnia.    Blood pressure 96/79, pulse 88, temperature 97.7 F (36.5 C), temperature source Oral, resp. rate 18, height 6' 1 (1.854 m), weight 103.7 kg, SpO2 97%. Body mass index is 30.16 kg/m.  Treatment Plan Summary: Daily contact with patient to assess and evaluate symptoms and progress in treatment and Medication management  Assessment:  Petina A Locastro is a 28 year old African-American female with prior psychiatric diagnoses significant for schizoaffective disorder, psychosis, paranoid schizophrenia, depression and anxiety who presents voluntarily to [facility] from [facility] behavioral health urgent care for  complaint of worsening auditory and visual hallucinations x 1 week.  Patient reports multiple inpatient psychiatric hospitalizations for her psychosis.  Last hospitalized at [facility] on April  2025.  UDS positive for marijuana.    Principal Problem:   Schizoaffective disorder (HCC)   Plans: Medications: --Continue Haldol  10 mg po daily for psychosis --Continue RISPERDAL  M-TABS disintegrating tablet from 6 mg p.o. at bedtime for psychosis --Continue valbenazine  (INGREZZA ) capsule 40 mg p.o. daily for prevention of EPS -- Continue hydroxyzine  tablet 25 mg p.o. 3 times daily as needed for anxiety -- Continue trazodone  tablet 50 mg p.o. at bedtime as needed for sleep   Medication for other medical problems: --Continue nicotine  patch 21 mg transdermal over 24 hours for smoking cessation --Continue albuterol  108 (90 base) microgram/ACT inhaler 1 to 2 puffs every 6 hours as needed for wheezing or shortness of breath   Other PRN Medications  -Acetaminophen  650 mg every 6 as needed/mild pain  -Maalox 30 mL oral every 4 as needed/digestion  -Magnesium  hydroxide 30 mL daily as needed/mild constipation    --The risks/benefits/side-effects/alternatives to this medication were discussed in detail with the patient and time was given for questions. The patient consents to medication trial.   -- Metabolic profile and EKG monitoring obtained while on an atypical antipsychotic (BMI: Lipid Panel: HbgA1c: QTc:)   -- Encouraged patient to participate in unit milieu and in scheduled group therapies    Continue BH Agitation Protocol  --Haldol  5 mg, oral, 3 times daily as needed, mild agitation  --Benadryl  50 mg, oral, 3 times daily as needed, mild agitation                                      OR   --Haldol  injection 5 mg, IM, 3 times daily as needed, moderate agitation  --Benadryl  injection 50 mg, IM, 3 times daily as needed, moderate agitation  --Ativan  injection 2 mg, IM, 3 times daily as needed, moderate agitation                                      OR  --Haldol  injection 10 mg, IM, 3 times daily as needed, severe agitation  --Benadryl  injection 50 mg, IM, 3 times daily as needed, severe  agitation  --Ativan  injection 2 mg, IM, 3 times daily as needed, severe agitation.   Admission labs reviewed: CMP: Creatinine 1.09 elevated, otherwise normal.  Lipid profile: HDL 31 Low, LDL 109 elevated, otherwise normal.  CBC with differential: RBC 5.42 elevated, hemoglobin 16.0 elevated, HCT 46.8 elevated, otherwise normal.  Prolactin: 96.0 elevated.  Pregnancy test: Negative.  TSH: 2.311 normal.  BAL.:  Less than 15.  UDS: Positive for marijuana.   New labs ordered: Vitamin D  25-hydroxy, vitamin B12.   EKG reviewed: Normal sinus rhythm, ventricular rate 98, QT/QTc 342/436   Safety and Monitoring:  Voluntary admission to inpatient psychiatric unit for safety, stabilization and treatment  Daily contact with patient to assess and evaluate symptoms and progress in treatment  Patient's case to be discussed in multi-disciplinary team meeting  Observation Level : q15 minute checks  Vital signs: q12 hours  Precautions: suicide, but pt currently verbally contracts for safety on unit?    Discharge Planning:  Social work and case management to assist with discharge planning and identification of hospital  follow-up needs prior to discharge  Estimated LOS: 5-7?days  Discharge Concerns: Need to establish a safety plan; Medication compliance and effectiveness  Discharge Goals: Return home with outpatient referrals for mental health follow-up including medication management/psychotherapy.    Physician Treatment Plan for Secondary Diagnosis: I certify that inpatient services furnished can reasonably be expected to improve the patient's condition.    Ellouise JAYSON Azure, FNP 07/29/2023, 11:43 AM Patient ID: [name], female   DOB: [birth year], 28 y.o.   MRN: [id number] Patient ID: [name], female   DOB: December 04, 1995, 28 y.o.   MRN: [id number] Patient ID: [name], female   DOB: 03-Sep-1995, 28 y.o.   MRN: [id number]

## 2023-07-29 NOTE — Group Note (Signed)
 Recreation Therapy Group Note   Group Topic:Leisure Education  Group Date: 07/29/2023 Start Time: 0930 End Time: 0957 Facilitators: Wynette Jersey-McCall, LRT,CTRS Location: [address] Dayroom   Group Topic: Leisure Education    Goal Area(s) Addresses:  Patient will successfully identify benefits of leisure participation. Patient will successfully identify ways to access leisure activities. Patient will identify leisure activities available based on their geographical location in proximity to their home.   Behavioral Response:    Intervention: Poster Making   Activity: LRT and patients discussed leisure and its importance in a persons development. Patients had the option of working in groups or individually. Groups were given a poster board and writing utensils. Patients were to then create and advertisement that highlighted what leisure is, who can benefit and examples of leisure activities. Patients were to create their PSA in the form of a print ad or commercial. Patients presented their PSAs at the end of group.    Education:  Leisure Education, Publishing copy Outcome: Acknowledges education   Affect/Mood: N/A   Participation Level: Did not attend    Clinical Observations/Individualized Feedback:     Plan: Continue to engage patient in RT group sessions 2-3x/week.   Toshia Larkin-McCall, LRT,CTRS  07/29/2023 12:27 PM

## 2023-07-29 NOTE — Plan of Care (Signed)
  Problem: Activity: Goal: Sleeping patterns will improve Outcome: Progressing   Problem: Health Behavior/Discharge Planning: Goal: Compliance with treatment plan for underlying cause of condition will improve Outcome: Progressing   Problem: Safety: Goal: Periods of time without injury will increase Outcome: Progressing   

## 2023-07-29 NOTE — Group Note (Signed)
 Date:  07/29/2023 Time:  2:20 PM  Group Topic/Focus:  Questionnaire Ball The purpose of this group is to pass the questionnaire ball around the room and do the soothing activities for 10 seconds     Participation Level:  Did Not Attend  Beatris ONEIDA Hasten 07/29/2023, 2:20 PM

## 2023-07-29 NOTE — Progress Notes (Addendum)
 Patient presents with flat affect and mostly sleeping in her room today. Did not attend groups today. Patient denies SI,HI, and A/V/H with no plan or intent. Patient is med compliant and remains cooperative in unit.     07/29/23 0817  Psych Admission Type (Psych Patients Only)  Admission Status Voluntary  Psychosocial Assessment  Patient Complaints None  Eye Contact Fair  Facial Expression Flat  Affect Flat  Speech Logical/coherent  Interaction Minimal  Motor Activity Slow  Appearance/Hygiene Unremarkable  Behavior Characteristics Cooperative  Mood Pleasant  Thought Process  Coherency WDL  Content WDL  Delusions None reported or observed  Perception WDL  Hallucination None reported or observed  Judgment Poor  Confusion None  Danger to Self  Current suicidal ideation? Denies  Agreement Not to Harm Self Yes  Description of Agreement verbal  Danger to Others  Danger to Others None reported or observed  Danger to Others Abnormal  Harmful Behavior to others No threats or harm toward other people  Destructive Behavior No threats or harm toward property

## 2023-07-29 NOTE — BH IP Treatment Plan (Signed)
 Interdisciplinary Treatment and Diagnostic Plan Update  07/29/2023 Time of Session: 12:20 PM - UPDATE Heather Kramer MRN: [id number]  Principal Diagnosis: Schizoaffective disorder (HCC)  Secondary Diagnoses: Principal Problem:   Schizoaffective disorder (HCC)   Current Medications:  Current Facility-Administered Medications  Medication Dose Route Frequency Provider Last Rate Last Admin   acetaminophen  (TYLENOL ) tablet 650 mg  650 mg Oral Q6H PRN Brent, Amanda C, NP       albuterol  (VENTOLIN  HFA) 108 (90 Base) MCG/ACT inhaler 1-2 puff  1-2 puff Inhalation Q6H PRN Brent, Amanda C, NP       alum & mag hydroxide-simeth (MAALOX/MYLANTA) 200-200-20 MG/5ML suspension 30 mL  30 mL Oral Q4H PRN Brent, Amanda C, NP       haloperidol  (HALDOL ) tablet 5 mg  5 mg Oral TID PRN Brent, Amanda C, NP   5 mg at 07/26/23 2148   And   diphenhydrAMINE  (BENADRYL ) capsule 50 mg  50 mg Oral TID PRN Brent, Amanda C, NP   50 mg at 07/26/23 2148   haloperidol  lactate (HALDOL ) injection 5 mg  5 mg Intramuscular TID PRN Brent, Amanda C, NP       And   diphenhydrAMINE  (BENADRYL ) injection 50 mg  50 mg Intramuscular TID PRN Brent, Amanda C, NP       And   LORazepam  (ATIVAN ) injection 2 mg  2 mg Intramuscular TID PRN Brent, Amanda C, NP       haloperidol  lactate (HALDOL ) injection 10 mg  10 mg Intramuscular TID PRN Thresa Alan BROCKS, NP       And   diphenhydrAMINE  (BENADRYL ) injection 50 mg  50 mg Intramuscular TID PRN Brent, Amanda C, NP       And   LORazepam  (ATIVAN ) injection 2 mg  2 mg Intramuscular TID PRN Brent, Amanda C, NP       haloperidol  (HALDOL ) tablet 10 mg  10 mg Oral QHS Ntuen, Tina C, FNP   10 mg at 07/28/23 2111   hydrOXYzine  (ATARAX ) tablet 25 mg  25 mg Oral TID PRN Brent, Amanda C, NP   25 mg at 07/28/23 2111   magnesium  hydroxide (MILK OF MAGNESIA) suspension 30 mL  30 mL Oral Daily PRN Brent, Amanda C, NP       nicotine  (NICODERM CQ  - dosed in mg/24 hours) patch 21 mg  21 mg Transdermal Daily  Brent, Amanda C, NP   21 mg at 07/29/23 9182   risperiDONE  (RISPERDAL  M-TABS) disintegrating tablet 6 mg  6 mg Oral QHS Ntuen, Tina C, FNP   6 mg at 07/28/23 2111   traZODone  (DESYREL ) tablet 50 mg  50 mg Oral QHS PRN Brent, Amanda C, NP   50 mg at 07/28/23 2111   valbenazine  (INGREZZA ) capsule 40 mg  40 mg Oral Daily Brent, Amanda C, NP   40 mg at 07/29/23 0818   PTA Medications: Medications Prior to Admission  Medication Sig Dispense Refill Last Dose/Taking   albuterol  (VENTOLIN  HFA) 108 (90 Base) MCG/ACT inhaler Inhale 1-2 puffs into the lungs every 6 (six) hours as needed for wheezing or shortness of breath. 1 each 0 Past Week   risperiDONE  (RISPERDAL ) 1 MG tablet Take 1 mg by mouth daily.   Past Week   risperidone  (RISPERDAL ) 4 MG tablet Take 4 mg by mouth at bedtime.   Past Week   traZODone  (DESYREL ) 50 MG tablet Take 1 tablet (50 mg total) by mouth at bedtime as needed for sleep. 30 tablet 0 Past Week  valbenazine  (INGREZZA ) 40 MG capsule Take 1 capsule (40 mg total) by mouth daily. (Patient taking differently: Take 80 mg by mouth daily.) 30 capsule 0 Past Month    Patient Stressors: Financial difficulties   Loss of family members    Patient Strengths: Capable of independent living  Motivation for treatment/growth  Supportive family/friends   Treatment Modalities: Medication Management, Group therapy, Case management,  1 to 1 session with clinician, Psychoeducation, Recreational therapy.   Physician Treatment Plan for Primary Diagnosis: Schizoaffective disorder (HCC) Long Term Goal(s): Improvement in symptoms so as ready for discharge   Short Term Goals: Ability to identify changes in lifestyle to reduce recurrence of condition will improve Ability to verbalize feelings will improve Ability to disclose and discuss suicidal ideas Ability to demonstrate self-control will improve Ability to identify and develop effective coping behaviors will improve Ability to maintain  clinical measurements within normal limits will improve Compliance with prescribed medications will improve Ability to identify triggers associated with substance abuse/mental health issues will improve  Medication Management: Evaluate patient's response, side effects, and tolerance of medication regimen.  Therapeutic Interventions: 1 to 1 sessions, Unit Group sessions and Medication administration.  Evaluation of Outcomes: Progressing  Physician Treatment Plan for Secondary Diagnosis: Principal Problem:   Schizoaffective disorder (HCC)  Long Term Goal(s): Improvement in symptoms so as ready for discharge   Short Term Goals: Ability to identify changes in lifestyle to reduce recurrence of condition will improve Ability to verbalize feelings will improve Ability to disclose and discuss suicidal ideas Ability to demonstrate self-control will improve Ability to identify and develop effective coping behaviors will improve Ability to maintain clinical measurements within normal limits will improve Compliance with prescribed medications will improve Ability to identify triggers associated with substance abuse/mental health issues will improve     Medication Management: Evaluate patient's response, side effects, and tolerance of medication regimen.  Therapeutic Interventions: 1 to 1 sessions, Unit Group sessions and Medication administration.  Evaluation of Outcomes: Progressing   RN Treatment Plan for Primary Diagnosis: Schizoaffective disorder (HCC) Long Term Goal(s): Knowledge of disease and therapeutic regimen to maintain health will improve  Short Term Goals: Ability to remain free from injury will improve, Ability to verbalize frustration and anger appropriately will improve, Ability to verbalize feelings will improve, and Ability to disclose and discuss suicidal ideas  Medication Management: RN will administer medications as ordered by provider, will assess and evaluate patient's  response and provide education to patient for prescribed medication. RN will report any adverse and/or side effects to prescribing provider.  Therapeutic Interventions: 1 on 1 counseling sessions, Psychoeducation, Medication administration, Evaluate responses to treatment, Monitor vital signs and CBGs as ordered, Perform/monitor CIWA, COWS, AIMS and Fall Risk screenings as ordered, Perform wound care treatments as ordered.  Evaluation of Outcomes: Progressing   LCSW Treatment Plan for Primary Diagnosis: Schizoaffective disorder (HCC) Long Term Goal(s): Safe transition to appropriate next level of care at discharge, Engage patient in therapeutic group addressing interpersonal concerns.  Short Term Goals: Engage patient in aftercare planning with referrals and resources, Increase ability to appropriately verbalize feelings, Facilitate acceptance of mental health diagnosis and concerns, and Identify triggers associated with mental health/substance abuse issues  Therapeutic Interventions: Assess for all discharge needs, 1 to 1 time with Social worker, Explore available resources and support systems, Assess for adequacy in community support network, Educate family and significant other(s) on suicide prevention, Complete Psychosocial Assessment, Interpersonal group therapy.  Evaluation of Outcomes: Progressing   Progress in  Treatment: Attending groups: attended some groups Participating in groups: Yes. Taking medication as prescribed: Yes. Toleration medication: Yes. Family/Significant other contact made: Yes, individual(s) contacted:  Mother, Heather Kramer [phone] Patient understands diagnosis: Yes. Discussing patient identified problems/goals with staff: Yes. Medical problems stabilized or resolved: Yes. Denies suicidal/homicidal ideation: Yes. Issues/concerns per patient self-inventory: No.  New problem(s) identified:  No  New Short Term/Long Term Goal(s):    medication  stabilization, elimination of SI thoughts, development of comprehensive mental wellness plan.   Patient Goals:  I want to get off drugs, I've been smoking for 9 years.    Discharge Plan or Barriers:  Patient recently admitted. CSW will continue to follow and assess for appropriate referrals and possible discharge planning.   Reason for Continuation of Hospitalization: Anxiety Depression Hallucinations Medication stabilization  Estimated Length of Stay:  1 - 2 days  Last 3 [place] Suicide Severity Risk Score: Flowsheet Row [facility] (Current) [facility] from 07/23/2023 in [facility] (Discharged[facility]  C-SSRS RISK CATEGORY No Risk No Risk Low Risk    Last PHQ 2/9 Scores:     No data to display          Scribe for Treatment Team: Cade Dashner O Toniqua Melamed, LCSWA 07/29/2023 7:35 PM

## 2023-07-29 NOTE — Progress Notes (Signed)
 Shift Note   (Sleep Hours) - 8.5  (Any PRNs that were needed, meds refused, or side effects to meds)- PO PRN Trazodone , PO PRN Hydroxyzine   (Any disturbances and when (visitation, over night)- None  (Concerns raised by the patient)- None  (SI/HI/AVH)- Denies

## 2023-07-29 NOTE — Group Note (Signed)
 Date:  07/29/2023 Time:  10:22 AM  Group Topic/Focus:  Goals Group/Stages of changes:   The focus of this group is to help patients establish daily goals to achieve during treatment and discuss how the patient can incorporate goal setting into their daily lives to aide in recovery. Also discussed the stages of change they were in and how ready for change they are and what possible steps they could take to changing in the future     Participation Level:  Did Not Attend  Heather Kramer 07/29/2023, 10:22 AM

## 2023-07-30 DIAGNOSIS — F251 Schizoaffective disorder, depressive type: Secondary | ICD-10-CM

## 2023-07-30 MED ORDER — TRAZODONE HCL 50 MG PO TABS: 50.0000 mg | ORAL_TABLET | Freq: Every evening | ORAL | 0 refills | Status: DC | PRN

## 2023-07-30 MED ORDER — HYDROXYZINE HCL 25 MG PO TABS: 25.0000 mg | ORAL_TABLET | Freq: Three times a day (TID) | ORAL | 0 refills | Status: DC | PRN

## 2023-07-30 MED ORDER — ALBUTEROL SULFATE HFA 108 (90 BASE) MCG/ACT IN AERS: 1.0000 | INHALATION_SPRAY | Freq: Four times a day (QID) | RESPIRATORY_TRACT | 0 refills | Status: AC | PRN

## 2023-07-30 MED ORDER — HALOPERIDOL 10 MG PO TABS: 10.0000 mg | ORAL_TABLET | Freq: Every day | ORAL | 0 refills | Status: AC

## 2023-07-30 MED ORDER — NICOTINE 21 MG/24HR TD PT24: 21.0000 mg | MEDICATED_PATCH | Freq: Every day | TRANSDERMAL | 0 refills | Status: DC

## 2023-07-30 MED ORDER — VALBENAZINE TOSYLATE 40 MG PO CAPS: 40.0000 mg | ORAL_CAPSULE | Freq: Every day | ORAL | 0 refills | Status: DC

## 2023-07-30 MED ORDER — RISPERIDONE 3 MG PO TBDP: 6.0000 mg | ORAL_TABLET | Freq: Every day | ORAL | 0 refills | Status: DC

## 2023-07-30 NOTE — Progress Notes (Signed)
   07/30/23 0900  Psych Admission Type (Psych Patients Only)  Admission Status Voluntary  Psychosocial Assessment  Patient Complaints None  Eye Contact Fair  Facial Expression Flat  Affect Flat  Speech Logical/coherent  Interaction Minimal  Motor Activity Slow  Appearance/Hygiene Unremarkable  Behavior Characteristics Cooperative  Mood Pleasant  Thought Process  Coherency WDL  Content WDL  Delusions None reported or observed  Perception WDL  Hallucination None reported or observed  Judgment Impaired  Confusion None  Danger to Self  Current suicidal ideation? Denies  Description of Suicide Plan No Plan  Agreement Not to Harm Self Yes  Description of Agreement Verbal  Danger to Others  Danger to Others None reported or observed  Danger to Others Abnormal  Harmful Behavior to others No threats or harm toward other people

## 2023-07-30 NOTE — Progress Notes (Signed)
 Discharge Note:  Patient has been discharged from room 401-1 to the lobby were her mother is waiting to drive the pt home. Pt has been explained all discharge instructions and paperwork. Pt has verbalized understanding of all instructions. Pt denies SI, HI, and AVH. Pt denies any pain. Contents of patients locker has been returned to pt.

## 2023-07-30 NOTE — Progress Notes (Signed)
(  Sleep Hours) - 10 (Any PRNs that were needed, meds refused, or side effects to meds)- Trazadone (Any disturbances and when (visitation, over night)- none (Concerns raised by the patient)- med changes - tiredness (SI/HI/AVH)- denied

## 2023-07-30 NOTE — Discharge Summary (Signed)
 Physician Discharge Summary Note  Patient:  Heather Kramer is an 28 y.o., female MRN:  [id number] DOB:  1995/08/06 Patient phone:  [phone] (home)  Patient address:   [address] [place] [address],  Total Time spent with patient: 45 minutes  Date of Admission:  07/24/2023 Date of Discharge:   07/30/2023  Reason for Admission:   Heather Kramer is a 27 year old African-American female with prior psychiatric diagnoses significant for schizoaffective disorder, psychosis, paranoid schizophrenia, depression and anxiety who presents voluntarily to [facility] from [facility] behavioral health urgent care for complaint of worsening auditory and visual hallucinations x 1 week.  Patient reports multiple inpatient psychiatric hospitalizations for her psychosis.  Last hospitalized at [facility] on April 2025.  UDS positive for marijuana.   Principal Problem: Schizoaffective disorder [facility]) Discharge Diagnoses: Principal Problem:   Schizoaffective disorder (HCC)  Past Psychiatric History: Previous Psych Diagnoses: Schizoaffective disorder, depression, anxiety, marijuana dependence, paranoid schizophrenia, psychosis Prior inpatient treatment: Patient report several inpatient psychiatric admissions.  Last admission to [facility] April 2025 Current/prior outpatient treatment: Yes, patient is being followed by ACT team from [facility].  Last seen by ACT Team on 07/22/2023 Prior rehab hx: Denies Psychotherapy hx: Yes History of suicide: Denies History of homicide or aggression: Denies, however patient states, I feel like I want to fight with somebody. Psychiatric medication history: Yes Psychiatric medication compliance history: Compliance, however, mom thinks patient is not compliant with medications. Neuromodulation history: Denies Current Psychiatrist: Yes, Dr. Malinda from [facility] Current therapist: None  Past  Medical History:  Past Medical History:  Diagnosis Date   Acquired acanthosis nigricans    Allergies    Asthma    Goiter    Goiter    Overweight(278.02)    Pre-diabetes    Schizophrenia (HCC)    Thyroiditis, autoimmune     Past Surgical History:  Procedure Laterality Date   MOUTH SURGERY     Family History:  Family History  Problem Relation Age of Onset   Obesity Mother    Heart disease Father    Cancer Maternal Grandmother    Diabetes Maternal Grandfather    Family Psychiatric  History: See H&P Social History:  Social History   Substance and Sexual Activity  Alcohol Use Yes   Comment: occasionally     Social History   Substance and Sexual Activity  Drug Use Not Currently   Types: Marijuana    Social History   Socioeconomic History   Marital status: Single    Spouse name: Not on file   Number of children: Not on file   Years of education: Not on file   Highest education level: 12th grade  Occupational History   Not on file  Tobacco Use   Smoking status: Every Day    Current packs/day: 2.00    Types: Cigarettes   Smokeless tobacco: Never  Vaping Use   Vaping status: Never Used  Substance and Sexual Activity   Alcohol use: Yes    Comment: occasionally   Drug use: Not Currently    Types: Marijuana   Sexual activity: Not Currently  Other Topics Concern   Not on file  Social History Narrative   Lives with mother and brother. 9th grade at [employer]. Plays basketball.    Social Drivers of Corporate investment banker Strain: Not on File (10/14/2021)   Received from [facility]  Financial Resource Strain: 0  Food Insecurity: No Food Insecurity (07/24/2023)   Hunger Vital Sign    Worried About Running Out of Food in the Last Year: Never true    Ran Out of Food in the Last Year: Never true  Transportation Needs: No Transportation Needs (07/24/2023)   PRAPARE - Administrator, Civil Service  (Medical): No    Lack of Transportation (Non-Medical): No  Physical Activity: Not on File (10/14/2021)   Received from [facility]   Physical Activity    Physical Activity: 0  Stress: Not on File (10/14/2021)   Received from [facility]   Stress    Stress: 0  Social Connections: Not on File (09/20/2022)   Received from [facility]   Social Connections    Connectedness: 0   Hospital Course:  During the patient's hospitalization, patient had extensive initial psychiatric evaluation, and follow-up psychiatric evaluations every day.  Psychiatric diagnoses provided upon initial assessment:  Diagnosis:  Principal Problem:   Schizoaffective disorder (HCC)  Patient's psychiatric medications were adjusted on admission:  -- Continue RISPERDAL  M-TABS disintegrating tablet 6 mg p.o. at bedtime for psychosis --Continue valbenazine  (INGREZZA ) capsule 40 mg p.o. daily for prevention of EPS -- Continue hydroxyzine  tablet 25 mg p.o. 3 times daily as needed for anxiety -- Continue trazodone  tablet 50 mg p.o. at bedtime as needed for sleep  During the hospitalization, other adjustments were made to the patient's psychiatric medication regimen:  --Haldol  10 mg p.o. at bedtime was added for psychosis  Patient's care was discussed during the interdisciplinary team meeting every day during the hospitalization.  The patient denies having side effects to prescribed psychiatric medication.  Gradually, patient started adjusting to milieu. The patient was evaluated each day by a clinical provider to ascertain response to treatment. Improvement was noted by the patient's report of decreasing symptoms, improved sleep and appetite, affect, medication tolerance, behavior, and participation in unit programming.  Patient was asked each day to complete a self inventory noting mood, mental status, pain, new symptoms, anxiety and concerns.    Symptoms were reported as significantly decreased or resolved completely by discharge.   On day  of discharge, the patient reports that their mood is stable. The patient denied having suicidal thoughts for more than 48 hours prior to discharge.  Patient denies having homicidal thoughts.  Patient denies having auditory hallucinations.  Patient denies any visual hallucinations or other symptoms of psychosis. The patient was motivated to continue taking medication with a goal of continued improvement in mental health.   The patient reports their target psychiatric symptoms of thought disorder responded well to the psychiatric medications, and the patient reports overall benefit other psychiatric hospitalization. Supportive psychotherapy was provided to the patient. The patient also participated in regular group therapy while hospitalized. Coping skills, problem solving as well as relaxation therapies were also part of the unit programming.  Labs were reviewed with the patient, and abnormal results were discussed with the patient.  The patient is able to verbalize their individual safety plan to this provider.  # It is recommended to the patient to continue psychiatric medications as prescribed, after discharge from the hospital.    # It is recommended to the patient to follow up with your outpatient psychiatric provider and PCP.  # It was discussed with the patient, the impact of alcohol, drugs, tobacco have been there overall psychiatric and medical wellbeing, and total abstinence from substance use was recommended the patient.ed.  # Prescriptions provided or sent  directly to preferred pharmacy at discharge. Patient agreeable to plan. Given opportunity to ask questions. Appears to feel comfortable with discharge.    # In the event of worsening symptoms, the patient is instructed to call the crisis hotline, 911 and or go to the nearest ED for appropriate evaluation and treatment of symptoms. To follow-up with primary care provider for other medical issues, concerns and or health care needs  #  Patient was discharged her home with a plan to follow up as noted below.   Addendum: Patient thought process and thought content remain stable and organized X 72 hours.  She responded effectively to the treatment regimen implemented on admission.  She is discharged to her home in stable condition.  Physical Findings: AIMS:  , ,  ,  ,  ,  ,   CIWA:    COWS:     Musculoskeletal: Strength & Muscle Tone: within normal limits Gait & Station:  normal Patient leans: N/A  Psychiatric Specialty Exam:  Presentation  General Appearance:  Appropriate for Environment; Casual; Fairly Groomed  Eye Contact: Good  Speech: Clear and Coherent; Normal Rate  Speech Volume: Normal  Handedness: Right  Mood and Affect  Mood: Euthymic  Affect: Congruent  Thought Process  Thought Processes: Coherent  Descriptions of Associations:Intact  Orientation:Full (Time, Place and Person)  Thought Content:WDL  History of Schizophrenia/Schizoaffective disorder:Yes  Duration of Psychotic Symptoms:Greater than six months  Hallucinations:Hallucinations: None Description of Command Hallucinations: Denies Description of Auditory Hallucinations: Denies  Ideas of Reference:None  Suicidal Thoughts:Suicidal Thoughts: No  Homicidal Thoughts:Homicidal Thoughts: No HI Passive Intent and/or Plan: -- (Denies)  Sensorium  Memory: Immediate Good; Recent Good  Judgment: Fair  Insight: Fair  Executive Functions  Concentration: Good  Attention Span: Good  Recall: Good  Fund of Knowledge: Fair  Language: Good  Psychomotor Activity  Psychomotor Activity: Psychomotor Activity: Normal  Assets  Assets: Communication Skills; Desire for Improvement; Physical Health; Resilience  Sleep  Sleep: Sleep: Good  Estimated Sleeping Duration (Last 24 Hours): 9.50-10.75 hours  Physical Exam: Physical Exam Vitals and nursing note reviewed.  Constitutional:      General: She is not  in acute distress.    Appearance: She is obese. She is not ill-appearing.  HENT:     Head: Normocephalic.     Right Ear: External ear normal.     Left Ear: External ear normal.     Nose: Nose normal.     Mouth/Throat:     Mouth: Mucous membranes are moist.  Eyes:     Extraocular Movements: Extraocular movements intact.  Cardiovascular:     Rate and Rhythm: Normal rate.     Pulses: Normal pulses.  Pulmonary:     Effort: Pulmonary effort is normal.  Abdominal:     Comments: Deferred  Genitourinary:    Comments: Deferred  Musculoskeletal:        General: Normal range of motion.     Cervical back: Normal range of motion.  Skin:    General: Skin is warm.  Neurological:     General: No focal deficit present.     Mental Status: She is alert and oriented to person, place, and time.  Psychiatric:        Mood and Affect: Mood normal.        Behavior: Behavior normal.        Thought Content: Thought content normal.    Review of Systems  Constitutional:  Negative for chills and fever.  HENT:  Negative for sore throat.   Eyes:  Negative for blurred vision.  Respiratory:  Negative for cough, sputum production, shortness of breath and wheezing.   Cardiovascular:  Negative for chest pain and palpitations.  Gastrointestinal:  Negative for abdominal pain, constipation, diarrhea, heartburn, nausea and vomiting.  Genitourinary:  Negative for dysuria.  Musculoskeletal:  Negative for falls.  Skin:  Negative for itching and rash.  Neurological:  Negative for dizziness and headaches.  Endo/Heme/Allergies:        See allergy listing  Psychiatric/Behavioral:  Negative for depression, hallucinations, substance abuse and suicidal ideas. The patient is nervous/anxious (Stable with medication). The patient does not have insomnia.    Blood pressure 115/70, pulse 62, temperature 97.7 F (36.5 C), temperature source Oral, resp. rate 18, height 6' 1 (1.854 m), weight 103.7 kg, SpO2 97%. Body mass  index is 30.16 kg/m.  Social History   Tobacco Use  Smoking Status Every Day   Current packs/day: 2.00   Types: Cigarettes  Smokeless Tobacco Never   Tobacco Cessation:  A prescription for an FDA-approved tobacco cessation medication provided at discharge  Blood Alcohol level:  Lab Results  Component Value Date   [facility] <15 07/23/2023   ETH <10 04/03/2023    Metabolic Disorder Labs:  Lab Results  Component Value Date   HGBA1C 5.1 04/05/2023   MPG 99.67 04/05/2023   MPG 103 06/13/2020   Lab Results  Component Value Date   PROLACTIN 96.0 (H) 07/23/2023   PROLACTIN 26.5 (H) 06/13/2020   Lab Results  Component Value Date   CHOL 156 07/23/2023   TRIG 78 07/23/2023   HDL 31 (L) 07/23/2023   CHOLHDL 5.0 07/23/2023   VLDL 16 07/23/2023   LDLCALC 109 (H) 07/23/2023   LDLCALC 108 (H) 04/05/2023    See Psychiatric Specialty Exam and Suicide Risk Assessment completed by Attending Physician prior to discharge.  Discharge destination:  Home  Is patient on multiple antipsychotic therapies at discharge:  No   Has Patient had three or more failed trials of antipsychotic monotherapy by history:  No  Recommended Plan for Multiple Antipsychotic Therapies: NA  Discharge Instructions     Increase activity slowly   Complete by: As directed       Allergies as of 07/30/2023       Reactions   Peanut-containing Drug Products Anaphylaxis        Medication List     TAKE these medications      Indication  albuterol  108 (90 Base) MCG/ACT inhaler Commonly known as: VENTOLIN  HFA Inhale 1-2 puffs into the lungs every 6 (six) hours as needed for wheezing or shortness of breath.  Indication: Asthma   haloperidol  10 MG tablet Commonly known as: HALDOL  Take 1 tablet (10 mg total) by mouth at bedtime.  Indication: Psychosis   hydrOXYzine  25 MG tablet Commonly known as: ATARAX  Take 1 tablet (25 mg total) by mouth 3 (three) times daily as needed for anxiety.  Indication:  Feeling Anxious   nicotine  21 mg/24hr patch Commonly known as: NICODERM CQ  - dosed in mg/24 hours Place 1 patch (21 mg total) onto the skin daily.  Indication: Nicotine  Addiction   risperiDONE  3 MG disintegrating tablet Commonly known as: RISPERDAL  M-TABS Take 2 tablets (6 mg total) by mouth at bedtime. What changed:  how much to take Another medication with the same name was removed. Continue taking this medication, and follow the directions you see here.  Indication: Schizophrenia   traZODone  50 MG tablet Commonly  known as: DESYREL  Take 1 tablet (50 mg total) by mouth at bedtime as needed for sleep.  Indication: Trouble Sleeping   valbenazine  40 MG capsule Commonly known as: INGREZZA  Take 1 capsule (40 mg total) by mouth daily. What changed: how much to take  Indication: Tardive Dyskinesia        Follow-up Information     [facility] Call.   Why: Please continue with this provider for ACTT services. Contact information: [address] [place] [address] [phone]         [facility] Follow up on 08/02/2023.   Why: You may call this provider on 08/02/23 at 9:00 am to schedule a hospital follow up appointment for therapy services.  Appointments will be Virtual. Contact information: [address]  [address] [place] [address] [phone]                Follow-up recommendations:   Discharge Recommendations:    The patient is being discharged with her family.   Patient is to take her discharge medications as ordered. ?See follow up above.   We recommend that she participates in individual therapy to target uncontrollable agitation and substance abuse.    We recommend that she participates in therapy to target the conflict with her family, to improve communication skills and conflict resolution skills. ?patient is to initiate/implement a contingency based behavioral model to address patient's behavior.   We recommend  that she gets AIMS scale, height, weight, blood pressure, fasting lipid panel, fasting blood sugar in three months from discharge if she's on atypical antipsychotics.    Patient will benefit from monitoring of recurrent suicidal ideation since patient is on antidepressant medication.   The patient should abstain from all illicit substances and alcohol.   If the patient's symptoms worsen or do not continue to improve or if the patient becomes actively suicidal or homicidal then it is recommended that the patient return to the closest hospital emergency room or call 911 for further evaluation and treatment. National Suicide Prevention Lifeline 1800-SUICIDE or [phone].   Please follow up with your primary medical doctor for all other medical needs.    The patient has been educated on the possible side effects to medications and she/her guardian is to contact a medical professional and inform outpatient provider of any new side effects of medication.   She is to take regular diet and activity as tolerated. ?Will benefit from moderate daily exercise.   Patient was educated about removing/locking any firearms, medications or dangerous products from the home.   Activity:  As tolerated   Diet:  Regular Diet   Signed: Ellouise JAYSON Azure, FNP 07/30/2023, 12:05 PM

## 2023-07-30 NOTE — BHH Suicide Risk Assessment (Signed)
 [facility] Discharge Suicide Risk Assessment   Principal Problem: Schizoaffective disorder [facility]) Discharge Diagnoses: Principal Problem:   Schizoaffective disorder (HCC)   Total Time spent with patient: 30 minutes  Musculoskeletal: Strength & Muscle Tone: within normal limits Gait & Station: normal Patient leans: N/A  Psychiatric Specialty Exam  Presentation  General Appearance:  Casually dressed, overweight, not in any distress, appropriate behavior, engaged politely.  No EPS.  Eye Contact: Good.  Speech: Spontaneous.  Normal rate, tone and volume.  Normal prosody of speech.  Mood and Affect  Mood: Euthymic.  Affect: Full range and appropriate.  Thought Process  Thought Processes: Linear and goal directed.  Descriptions of Associations:Intact  Orientation:Full (Time, Place and Person)  Thought Content: Future oriented.  No current suicidal thoughts.  No homicidal thoughts.  No thoughts of violence.  No negative ruminative flooding.  No guilty ruminations.  No delusional theme.  No obsessions.  Hallucinations: No hallucination in any modality.  Sensorium  Memory: Good.  Judgment: Good.  Insight: Good  Executive Functions  Concentration: Good.  Attention Span: Good.  Recall: Good.  Fund of Knowledge: Good.  Language: Good   Psychomotor Activity  Normal psychomotor activity   Physical Exam: Physical Exam ROS Blood pressure 115/70, pulse 62, temperature 97.7 F (36.5 C), temperature source Oral, resp. rate 18, height 6' 1 (1.854 m), weight 103.7 kg, SpO2 97%. Body mass index is 30.16 kg/m.  Mental Status Per Nursing Assessment::   On Admission:  NA  Demographic Factors:  Adolescent or young adult and Living alone  Loss Factors: NA  Historical Factors: Family history of mental illness or substance abuse  Risk Reduction Factors:   Sense of responsibility to family, Positive social support, Positive therapeutic relationship, and  Positive coping skills or problem solving skills  Continued Clinical Symptoms:  Patient has responded well to treatment.  Psychosis has completely resolved.  She is attending to her basic needs.  Sleep-wake cycle is well regulated.  Cognitive Features That Contribute To Risk:  None    Suicide Risk:  Minimal: No current suicidal thoughts.  No homicidal thoughts.  No thoughts of violence.  Modifiable risk factor targeted during this admission as psychosis.  There are no new psychosocial stressors.  Family remains supportive.  No substance use. Patient is stable for care to lower setting.  Follow-up Information     [facility] Call.   Why: Please continue with this provider for ACTT services. Contact information: [address] [place] [address] [phone]         [facility] Follow up on 08/02/2023.   Why: You may call this provider on 08/02/23 at 9:00 am to schedule a hospital follow up appointment for therapy services.  Appointments will be Virtual. Contact information: [address]  [address] [place] [address] [phone]                 Plan Of Care/Follow-up recommendations:  See discharge summary  Jerrell DELENA Forehand, MD 07/30/2023, 11:07 AM

## 2023-07-30 NOTE — Group Note (Signed)
 Date:  07/30/2023 Time:  12:57 PM  Group Topic/Focus:  Goals Group:   The focus of this group is to help patients establish daily goals to achieve during treatment and discuss how the patient can incorporate goal setting into their daily lives to aide in recovery. Orientation:   The focus of this group is to educate the patient on the purpose and policies of crisis stabilization and provide a format to answer questions about their admission.  The group details unit policies and expectations of patients while admitted.    Participation Level:  Did Not Attend    Heather Kramer Mars 07/30/2023, 12:57 PM

## 2023-07-30 NOTE — Progress Notes (Signed)
  [facility] Adult Case Management Discharge Plan :  Will you be returning to the same living situation after discharge:  Yes,    At discharge, do you have transportation home?: Yes,   Heather Kramer [phone] @1400  Do you have the ability to pay for your medications: Yes,   [employer] MEDICARE / DREMA DUAL COMPLETE  Release of information consent forms completed and in the chart;  Patient's signature needed at discharge.  Patient to Follow up at:  Follow-up Information     [facility] Call.   Why: Please continue with this provider for ACTT services. Contact information: [address] [place] [address] [phone]         [facility] Follow up on 08/02/2023.   Why: You may call this provider on 08/02/23 at 9:00 am to schedule a hospital follow up appointment for therapy services.  Appointments will be Virtual. Contact information: [address]  [address] [place] [address] [phone]                 Next level of care provider has access to [facility] Link:no  Safety Planning and Suicide Prevention discussed:  Heather Kramer [phone]-   Has patient been referred to the Quitline?: Patient refused referral for treatment  Patient has been referred for addiction treatment: Patient refused referral for treatment. Pt denies  Golda Louder, LCSW 07/30/2023, 9:56 AM

## 2023-11-03 ENCOUNTER — Encounter (HOSPITAL_COMMUNITY): Payer: Self-pay

## 2023-11-03 ENCOUNTER — Ambulatory Visit (HOSPITAL_COMMUNITY)
Admission: EM | Admit: 2023-11-03 | Discharge: 2023-11-03 | Disposition: A | Discharge status: Home / Self Care | Attending: Physician Assistant | Admitting: Physician Assistant

## 2023-11-03 DIAGNOSIS — R0789 Other chest pain: Secondary | ICD-10-CM | POA: Diagnosis not present

## 2023-11-03 DIAGNOSIS — M940 Chondrocostal junction syndrome [Tietze]: Secondary | ICD-10-CM

## 2023-11-03 MED ORDER — IPRATROPIUM-ALBUTEROL 0.5-2.5 (3) MG/3ML IN SOLN
RESPIRATORY_TRACT | Status: AC
Start: 2023-11-03 — End: 2023-11-03
  Filled 2023-11-03: qty 3

## 2023-11-03 MED ORDER — IPRATROPIUM-ALBUTEROL 0.5-2.5 (3) MG/3ML IN SOLN
3.0000 mL | Freq: Once | RESPIRATORY_TRACT | Status: AC
  Administered 2023-11-03: 3 mL via RESPIRATORY_TRACT

## 2023-11-03 NOTE — Discharge Instructions (Addendum)
 You were seen today for concerns of chest wall pain on both sides of the chest that gets worse with pressure and sometimes with deep breaths. You declined an EKG today but your symptoms do not appear to involve your heart. I suspect that you may have chest wall muscular pain and something called costochondritis. Since the pain becomes worse with smoking, I recommend that you stop smoking as soon as possible. Please use your albuterol  inhaler to help with your breathing. I also recommend that you alternate Tylenol  and Ibuprofen for the pain.  If you start having more severe pain, difficulty breathing, loss of consciousness, persistent chest pain that is not getting better or becoming more severe, confusion please go to the ED.

## 2023-11-03 NOTE — ED Triage Notes (Signed)
 Patient here today with c/o chest pain X 1 day. Patient states that she has a h/o chest pains and has had lots of ECGs in the past and does not want to have an ECG today. Patient states that she smokes daily and thinks that the chest pain is caused by that. Patient has used Nicotine  patches in the past but not currently. Patient is trying to quit.

## 2023-11-03 NOTE — ED Notes (Signed)
 Spoke to patient in lobby.  Non-specific description of pain.  Onset 24 hours ago.  Answering appropriately, skin warm and dry, respirations regular and unlabored

## 2023-11-03 NOTE — ED Provider Notes (Addendum)
 [facility]    CSN: [id number] Arrival date & time: 11/03/23  1449      History   Chief Complaint Chief Complaint  Patient presents with   Chest Pain    HPI Heather Kramer is a 28 y.o. female.   HPI  Pt is here for concern of bilateral chest pain that is the same on both sides. She states this started a while ago (she thinks it has been going on longer than 24 hours) She states this feels like a stabbing sensation in the heart. She declines EKG today.  She reports she has had similar, more mild pain in the past. She states in the past it has been a 3/10 but today it is 8/10  She states that when she stops smoking and drinks water  the pain improves. She reports smoking seems to make the pain worse. It sometimes gets worse with deep inspiration. She states she that she has also been having some chest tightness and SOB. She denies fever or chills but states that when she wakes up she often feels like she has a cold- sore throat, runny nose.    Past Medical History:  Diagnosis Date   Acquired acanthosis nigricans    Allergies    Asthma    Goiter    Goiter    Overweight(278.02)    Pre-diabetes    Schizophrenia (HCC)    Thyroiditis, autoimmune     Patient Active Problem List   Diagnosis Date Noted   Schizoaffective disorder (HCC) 07/24/2023   Depression 04/04/2023   Anxiety 04/04/2023   Delta-9-tetrahydrocannabinol (THC) dependence (HCC) 04/04/2023   Tobacco use disorder 04/04/2023   Insomnia 04/04/2023   Paranoid schizophrenia (HCC) 08/02/2019   Psychosis (HCC) 07/29/2019   Goiter    Thyroiditis, autoimmune    Acquired acanthosis nigricans    Acromegaly and gigantism (HCC) 06/26/2010   Goiter 06/26/2010   Pre-diabetes 06/26/2010    Past Surgical History:  Procedure Laterality Date   MOUTH SURGERY      OB History   No obstetric history on file.      Home Medications    Prior to Admission medications   Medication Sig Start Date End Date  Taking? Authorizing Provider  albuterol  (VENTOLIN  HFA) 108 (90 Base) MCG/ACT inhaler Inhale 1-2 puffs into the lungs every 6 (six) hours as needed for wheezing or shortness of breath. 07/30/23   Ntuen, Tina C, FNP  haloperidol  (HALDOL ) 10 MG tablet Take 1 tablet (10 mg total) by mouth at bedtime. 07/30/23   Ntuen, Tina C, FNP  hydrOXYzine  (ATARAX ) 25 MG tablet Take 1 tablet (25 mg total) by mouth 3 (three) times daily as needed for anxiety. 07/30/23   Ntuen, Tina C, FNP  nicotine  (NICODERM CQ  - DOSED IN MG/24 HOURS) 21 mg/24hr patch Place 1 patch (21 mg total) onto the skin daily. Patient not taking: Reported on 11/03/2023 07/30/23   Ntuen, Tina C, FNP  risperiDONE  (RISPERDAL  M-TABS) 3 MG disintegrating tablet Take 2 tablets (6 mg total) by mouth at bedtime. Patient not taking: Reported on 11/03/2023 07/30/23   Ntuen, Tina C, FNP  valbenazine  (INGREZZA ) 40 MG capsule Take 1 capsule (40 mg total) by mouth daily. Patient not taking: Reported on 11/03/2023 07/30/23   Raye Ellouise BROCKS, FNP    Family History Family History  Problem Relation Age of Onset   Obesity Mother    Heart disease Father    Cancer Maternal Grandmother    Diabetes Maternal Grandfather  Social History Social History   Tobacco Use   Smoking status: Every Day    Current packs/day: 2.00    Types: Cigarettes   Smokeless tobacco: Never  Vaping Use   Vaping status: Never Used  Substance Use Topics   Alcohol use: Yes    Comment: occasionally   Drug use: Not Currently    Types: Marijuana     Allergies   Peanut-containing drug products   Review of Systems Review of Systems  Constitutional:  Negative for chills and fever.  Respiratory:  Positive for chest tightness and shortness of breath.   Cardiovascular:  Positive for chest pain.     Physical Exam Triage Vital Signs ED Triage Vitals  Encounter Vitals Group     BP 11/03/23 1520 110/68     Girls Systolic BP Percentile --      Girls Diastolic BP Percentile --       Boys Systolic BP Percentile --      Boys Diastolic BP Percentile --      Pulse Rate 11/03/23 1520 98     Resp 11/03/23 1520 16     Temp 11/03/23 1520 99.1 F (37.3 C)     Temp Source 11/03/23 1520 Oral     SpO2 11/03/23 1520 97 %     Weight --      Height --      Head Circumference --      Peak Flow --      Pain Score 11/03/23 1519 8     Pain Loc --      Pain Education --      Exclude from Growth Chart --    No data found.  Updated Vital Signs BP 110/68 (BP Location: Left Arm)   Pulse 98   Temp 99.1 F (37.3 C) (Oral)   Resp 16   LMP 10/23/2023 (Approximate)   SpO2 97%   Visual Acuity Right Eye Distance:   Left Eye Distance:   Bilateral Distance:    Right Eye Near:   Left Eye Near:    Bilateral Near:     Physical Exam Vitals reviewed.  Constitutional:      General: She is awake. She is not in acute distress.    Appearance: Normal appearance. She is well-developed and well-groomed. She is not ill-appearing, toxic-appearing or diaphoretic.  HENT:     Head: Normocephalic and atraumatic.  Pulmonary:     Effort: Pulmonary effort is normal. No accessory muscle usage, prolonged expiration, respiratory distress or retractions.     Breath sounds: Normal breath sounds. No decreased air movement. No decreased breath sounds, wheezing, rhonchi or rales.  Chest:     Chest wall: Tenderness present.     Comments: Pt reports tenderness of the pectoral area with palpation. She states that palpation recreates the chest pain sensation that caused her to present to UC.  Musculoskeletal:     Cervical back: Normal range of motion and neck supple.  Neurological:     Mental Status: She is alert.  Psychiatric:        Behavior: Behavior is cooperative.      UC Treatments / Results  Labs (all labs ordered are listed, but only abnormal results are displayed) Labs Reviewed - No data to display  EKG   Radiology No results found.  Procedures Procedures (including critical  care time)  Medications Ordered in UC Medications  ipratropium-albuterol  (DUONEB) 0.5-2.5 (3) MG/3ML nebulizer solution 3 mL (3 mLs Nebulization Given 11/03/23 1559)  Initial Impression / Assessment and Plan / UC Course  I have reviewed the triage vital signs and the nursing notes.  Pertinent labs & imaging results that were available during my care of the patient were reviewed by me and considered in my medical decision making (see chart for details).      Final Clinical Impressions(s) / UC Diagnoses   Final diagnoses:  Chest wall pain  Costochondritis, acute   Patient presents today with concerns for bilateral chest wall pain has been ongoing for the past few days at least.  She is unsure of exact timeline but states that has been longer than 24 hours.  She reports that she has had similar milder pain in the past and states that lately it has been an 8/10 but currently denies pain.  She reports that smoking and sometimes deep inspiration seems to make the pain worse.  She does report some intermittent chest tightness and shortness of breath.  Physical exam is notable for significant tenderness to palpation of the pectoral area bilaterally.  Heart and lung exams are largely reassuring.  At this time I suspect she is likely having chest wall pain with potential costochondritis.  She reports that breathing did slightly improve following DuoNeb administration in clinic.  Air movement and breath sounds slightly improved following administration.  Reviewed that she should use her albuterol  inhaler as needed for shortness of breath and coughing.  Patient declined EKG multiple times in clinic today.  Given her presentation I am less suspicious for cardiac involvement especially with reproducible chest pain.  Recommend she stop smoking especially since this seems to be a trigger.  ED and return precautions reviewed and provided in AVS.  Follow-up as needed.    Discharge Instructions      You  were seen today for concerns of chest wall pain on both sides of the chest that gets worse with pressure and sometimes with deep breaths. You declined an EKG today but your symptoms do not appear to involve your heart. I suspect that you may have chest wall muscular pain and something called costochondritis. Since the pain becomes worse with smoking, I recommend that you stop smoking as soon as possible. Please use your albuterol  inhaler to help with your breathing. I also recommend that you alternate Tylenol  and Ibuprofen for the pain.  If you start having more severe pain, difficulty breathing, loss of consciousness, persistent chest pain that is not getting better or becoming more severe, confusion please go to the ED.      ED Prescriptions   None    PDMP not reviewed this encounter.   Marylene Rocky BRAVO, PA-C 11/03/23 1912    Marylene Rocky BRAVO, PA-C 11/03/23 1912

## 2024-02-14 ENCOUNTER — Ambulatory Visit (HOSPITAL_COMMUNITY)
Admission: EM | Admit: 2024-02-14 | Discharge: 2024-02-15 | Disposition: A | Source: Home / Self Care | Attending: Psychiatry | Admitting: Psychiatry

## 2024-02-14 DIAGNOSIS — Z3202 Encounter for pregnancy test, result negative: Secondary | ICD-10-CM

## 2024-02-14 DIAGNOSIS — F209 Schizophrenia, unspecified: Secondary | ICD-10-CM

## 2024-02-14 LAB — COMPREHENSIVE METABOLIC PANEL WITH GFR
ALT: 12 U/L (ref 0–44)
AST: 21 U/L (ref 15–41)
Albumin: 4.9 g/dL (ref 3.5–5.0)
Alkaline Phosphatase: 71 U/L (ref 38–126)
Anion gap: 17 — ABNORMAL HIGH (ref 5–15)
BUN: 10 mg/dL (ref 6–20)
CO2: 22 mmol/L (ref 22–32)
Calcium: 10.3 mg/dL (ref 8.9–10.3)
Chloride: 97 mmol/L — ABNORMAL LOW (ref 98–111)
Creatinine, Ser: 1.03 mg/dL — ABNORMAL HIGH (ref 0.44–1.00)
GFR, Estimated: >60 mL/min
Glucose, Bld: 67 mg/dL — ABNORMAL LOW (ref 70–99)
Potassium: 4.1 mmol/L (ref 3.5–5.1)
Sodium: 137 mmol/L (ref 135–145)
Total Bilirubin: 0.8 mg/dL (ref 0.0–1.2)
Total Protein: 8.4 g/dL — ABNORMAL HIGH (ref 6.5–8.1)

## 2024-02-14 LAB — CBC WITH DIFFERENTIAL/PLATELET
Abs Immature Granulocytes: 0.01 10*3/uL (ref 0.00–0.07)
Basophils Absolute: 0 10*3/uL (ref 0.0–0.1)
Basophils Relative: 0 %
Eosinophils Absolute: 0 10*3/uL (ref 0.0–0.5)
Eosinophils Relative: 0 %
HCT: 46.4 % — ABNORMAL HIGH (ref 36.0–46.0)
Hemoglobin: 16.3 g/dL — ABNORMAL HIGH (ref 12.0–15.0)
Immature Granulocytes: 0 %
Lymphocytes Relative: 21 %
Lymphs Abs: 2.1 10*3/uL (ref 0.7–4.0)
MCH: 30.2 pg (ref 26.0–34.0)
MCHC: 35.1 g/dL (ref 30.0–36.0)
MCV: 86.1 fL (ref 80.0–100.0)
Monocytes Absolute: 0.7 10*3/uL (ref 0.1–1.0)
Monocytes Relative: 8 %
Neutro Abs: 6.7 10*3/uL (ref 1.7–7.7)
Neutrophils Relative %: 71 %
Platelets: 374 10*3/uL (ref 150–400)
RBC: 5.39 MIL/uL — ABNORMAL HIGH (ref 3.87–5.11)
RDW: 12.4 % (ref 11.5–15.5)
WBC: 9.6 10*3/uL (ref 4.0–10.5)
nRBC: 0 % (ref 0.0–0.2)

## 2024-02-14 LAB — MAGNESIUM: Magnesium: 1.9 mg/dL (ref 1.7–2.4)

## 2024-02-14 LAB — POCT URINE DRUG SCREEN - MANUAL ENTRY (I-SCREEN)
POC Amphetamine UR: NOT DETECTED
POC Buprenorphine (BUP): NOT DETECTED
POC Cocaine UR: NOT DETECTED
POC Marijuana UR: POSITIVE — AB
POC Methadone UR: NOT DETECTED
POC Methamphetamine UR: NOT DETECTED
POC Morphine: NOT DETECTED
POC Oxazepam (BZO): POSITIVE — AB
POC Oxycodone UR: NOT DETECTED
POC Secobarbital (BAR): NOT DETECTED

## 2024-02-14 LAB — ETHANOL: Alcohol, Ethyl (B): <15 mg/dL

## 2024-02-14 LAB — HEMOGLOBIN A1C
Hgb A1c MFr Bld: 5.1 % (ref 4.8–5.6)
Mean Plasma Glucose: 99.67 mg/dL

## 2024-02-14 LAB — POCT PREGNANCY, URINE: Preg Test, Ur: NEGATIVE

## 2024-02-14 LAB — LIPID PANEL
Cholesterol: 217 mg/dL — ABNORMAL HIGH (ref 0–200)
HDL: 37 mg/dL — ABNORMAL LOW
LDL Cholesterol: 159 mg/dL — ABNORMAL HIGH (ref 0–99)
Total CHOL/HDL Ratio: 5.9 ratio
Triglycerides: 105 mg/dL
VLDL: 21 mg/dL (ref 0–40)

## 2024-02-14 LAB — TSH: TSH: 1.08 u[IU]/mL (ref 0.350–4.500)

## 2024-02-14 LAB — POC URINE PREG, ED: Preg Test, Ur: NEGATIVE

## 2024-02-14 MED ORDER — ACETAMINOPHEN 325 MG PO TABS: 650.0000 mg | ORAL_TABLET | Freq: Four times a day (QID) | ORAL | Status: DC | PRN

## 2024-02-14 MED ORDER — RISPERIDONE 2 MG PO TBDP
6.0000 mg | ORAL_TABLET | Freq: Every day | ORAL | Status: DC
  Administered 2024-02-14: 6 mg via ORAL
  Filled 2024-02-14: qty 3

## 2024-02-14 MED ORDER — DIPHENHYDRAMINE HCL 50 MG PO CAPS: 50.0000 mg | ORAL_CAPSULE | Freq: Three times a day (TID) | ORAL | Status: DC | PRN

## 2024-02-14 MED ORDER — DIPHENHYDRAMINE HCL 50 MG/ML IJ SOLN: 50.0000 mg | Freq: Three times a day (TID) | INTRAMUSCULAR | Status: DC | PRN

## 2024-02-14 MED ORDER — TRAZODONE HCL 50 MG PO TABS
50.0000 mg | ORAL_TABLET | Freq: Every evening | ORAL | Status: DC | PRN
  Filled 2024-02-14: qty 1

## 2024-02-14 MED ORDER — HALOPERIDOL LACTATE 5 MG/ML IJ SOLN: 10.0000 mg | Freq: Three times a day (TID) | INTRAMUSCULAR | Status: DC | PRN

## 2024-02-14 MED ORDER — HALOPERIDOL 5 MG PO TABS
5.0000 mg | ORAL_TABLET | Freq: Every day | ORAL | Status: DC
  Administered 2024-02-14: 5 mg via ORAL
  Filled 2024-02-14: qty 1

## 2024-02-14 MED ORDER — HALOPERIDOL 5 MG PO TABS
5.0000 mg | ORAL_TABLET | Freq: Once | ORAL | Status: AC
  Administered 2024-02-14: 5 mg via ORAL
  Filled 2024-02-14: qty 1

## 2024-02-14 MED ORDER — HYDROXYZINE HCL 25 MG PO TABS
25.0000 mg | ORAL_TABLET | Freq: Three times a day (TID) | ORAL | Status: DC | PRN
  Administered 2024-02-14: 25 mg via ORAL
  Filled 2024-02-14: qty 1

## 2024-02-14 MED ORDER — LORAZEPAM 2 MG/ML IJ SOLN: 2.0000 mg | Freq: Three times a day (TID) | INTRAMUSCULAR | Status: DC | PRN

## 2024-02-14 MED ORDER — HALOPERIDOL 5 MG PO TABS: 5.0000 mg | ORAL_TABLET | Freq: Three times a day (TID) | ORAL | Status: DC | PRN

## 2024-02-14 MED ORDER — HALOPERIDOL LACTATE 5 MG/ML IJ SOLN: 5.0000 mg | Freq: Three times a day (TID) | INTRAMUSCULAR | Status: DC | PRN

## 2024-02-14 MED ORDER — BENZTROPINE MESYLATE 1 MG PO TABS
1.0000 mg | ORAL_TABLET | Freq: Two times a day (BID) | ORAL | Status: DC
  Administered 2024-02-14: 1 mg via ORAL
  Filled 2024-02-14 (×2): qty 1

## 2024-02-14 MED ORDER — ALUM & MAG HYDROXIDE-SIMETH 200-200-20 MG/5ML PO SUSP: 30.0000 mL | ORAL | Status: DC | PRN

## 2024-02-14 MED ORDER — MAGNESIUM HYDROXIDE 400 MG/5ML PO SUSP: 30.0000 mL | Freq: Every day | ORAL | Status: DC | PRN

## 2024-02-14 NOTE — ED Notes (Signed)
Pt signed Electronic  consent.

## 2024-02-14 NOTE — ED Notes (Signed)
 Patient has been brought on unit, familiarized with unit, food has been offered. Patient is now sitting on recliner eating and watching television, patients respirations are even and unlabored, patient does not appear to be in any acute distress, will continue to monitor patient for safety.

## 2024-02-14 NOTE — ED Notes (Signed)
 Report called to RN Jessica Barrett, [id number].  Librarian, Academic.
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
## 2024-02-14 NOTE — ED Notes (Signed)
 Patient is lying on recliner, patient appears to be in no acute distress, respirations are even and unlabored, will continue to monitor patient for safety.

## 2024-02-14 NOTE — ED Notes (Signed)
 Pt is currently sleeping, no distress noted, environmental check complete, will continue to monitor patient for safety.

## 2024-02-15 ENCOUNTER — Inpatient Hospital Stay: Admission: AD | Admit: 2024-02-15 | Source: Intra-hospital | Admitting: Psychiatry

## 2024-02-15 ENCOUNTER — Other Ambulatory Visit: Payer: Self-pay

## 2024-02-15 ENCOUNTER — Encounter: Payer: Self-pay | Admitting: Psychiatry

## 2024-02-15 DIAGNOSIS — F209 Schizophrenia, unspecified: Secondary | ICD-10-CM | POA: Diagnosis present

## 2024-02-15 MED ORDER — HALOPERIDOL LACTATE 5 MG/ML IJ SOLN: 5.0000 mg | Freq: Three times a day (TID) | INTRAMUSCULAR | Status: AC | PRN

## 2024-02-15 MED ORDER — LORAZEPAM 2 MG/ML IJ SOLN: 2.0000 mg | Freq: Three times a day (TID) | INTRAMUSCULAR | Status: AC | PRN

## 2024-02-15 MED ORDER — DIPHENHYDRAMINE HCL 50 MG/ML IJ SOLN: 50.0000 mg | Freq: Three times a day (TID) | INTRAMUSCULAR | Status: AC | PRN

## 2024-02-15 MED ORDER — HALOPERIDOL 5 MG PO TABS
5.0000 mg | ORAL_TABLET | Freq: Three times a day (TID) | ORAL | Status: AC | PRN
  Administered 2024-02-15 – 2024-02-16 (×2): 5 mg via ORAL
  Filled 2024-02-15 (×2): qty 1

## 2024-02-15 MED ORDER — NICOTINE 14 MG/24HR TD PT24
14.0000 mg | MEDICATED_PATCH | Freq: Every day | TRANSDERMAL | Status: AC
  Filled 2024-02-15 (×2): qty 1

## 2024-02-15 MED ORDER — HALOPERIDOL 1 MG PO TABS
2.0000 mg | ORAL_TABLET | Freq: Every day | ORAL | Status: AC
  Administered 2024-02-15 – 2024-02-17 (×3): 2 mg via ORAL
  Filled 2024-02-15 (×3): qty 2

## 2024-02-15 MED ORDER — RISPERIDONE 3 MG PO TABS
6.0000 mg | ORAL_TABLET | Freq: Every day | ORAL | Status: DC
  Administered 2024-02-15: 6 mg via ORAL
  Filled 2024-02-15: qty 6
  Filled 2024-02-15: qty 2

## 2024-02-15 MED ORDER — TRAZODONE HCL 50 MG PO TABS: 50.0000 mg | ORAL_TABLET | Freq: Every evening | ORAL | Status: DC | PRN

## 2024-02-15 MED ORDER — HALOPERIDOL LACTATE 5 MG/ML IJ SOLN: 10.0000 mg | Freq: Three times a day (TID) | INTRAMUSCULAR | Status: AC | PRN

## 2024-02-15 MED ORDER — HYDROXYZINE HCL 25 MG PO TABS
25.0000 mg | ORAL_TABLET | Freq: Three times a day (TID) | ORAL | Status: AC | PRN
  Administered 2024-02-16: 25 mg via ORAL
  Filled 2024-02-15: qty 1

## 2024-02-15 MED ORDER — DIPHENHYDRAMINE HCL 25 MG PO CAPS
50.0000 mg | ORAL_CAPSULE | Freq: Three times a day (TID) | ORAL | Status: AC | PRN
  Administered 2024-02-15 – 2024-02-16 (×2): 50 mg via ORAL
  Filled 2024-02-15 (×2): qty 2

## 2024-02-15 MED ORDER — ENSURE PLUS HIGH PROTEIN PO LIQD
237.0000 mL | Freq: Two times a day (BID) | ORAL | Status: AC
  Administered 2024-02-15 – 2024-02-17 (×3): 237 mL via ORAL

## 2024-02-15 MED ORDER — BENZTROPINE MESYLATE 1 MG PO TABS
1.0000 mg | ORAL_TABLET | Freq: Two times a day (BID) | ORAL | Status: AC
  Administered 2024-02-15: 1 mg via ORAL
  Filled 2024-02-15 (×5): qty 1

## 2024-02-15 NOTE — Group Note (Signed)
 Date:  02/15/2024 Time:  11:09 AM  Group Topic/Focus:  Goals Group:   The focus of this group is to help patients establish daily goals to achieve during treatment and discuss how the patient can incorporate goal setting into their daily lives to aide in recovery.    Participation Level:  Active  Participation Quality:  Appropriate  Affect:  Appropriate  Cognitive:  Alert  Insight: Appropriate  Engagement in Group:  Engaged  Modes of Intervention:  Activity, Discussion, and Education  Additional Comments:    Heather Kramer 02/15/2024, 11:09 AM

## 2024-02-15 NOTE — Plan of Care (Signed)

## 2024-02-15 NOTE — Plan of Care (Signed)
   Problem: Education: Goal: Verbalization of understanding the information provided will improve Outcome: Progressing   Problem: Activity: Goal: Interest or engagement in activities will improve Outcome: Progressing Goal: Sleeping patterns will improve Outcome: Progressing

## 2024-02-15 NOTE — [facility] Suicide Risk Assessment (Signed)
 [facility] INPATIENT:  Family/Significant Other Suicide Prevention Education  Suicide Prevention Education:  Patient Refusal for Family/Significant Other Suicide Prevention Education: The patient Heather Kramer has refused to provide written consent for family/significant other to be provided Family/Significant Other Suicide Prevention Education during admission and/or prior to discharge.  Physician notified.  SPE completed with pt, as pt refused to consent to family contact. SPI pamphlet provided to pt and pt was encouraged to share information with support network, ask questions, and talk about any concerns relating to SPE. Pt denies access to guns/firearms and verbalized understanding of information provided. Mobile Crisis information also provided to pt.  Nadara JONELLE Fam 02/15/2024, 3:25 PM
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
## 2024-02-15 NOTE — Progress Notes (Signed)
" °   02/15/24 0130  Psych Admission Type (Psych Patients Only)  Admission Status Voluntary  Psychosocial Assessment  Patient Complaints None  Eye Contact Fair  Facial Expression Flat  Affect Flat  Speech Logical/coherent  Interaction Minimal  Motor Activity Slow  Appearance/Hygiene In scrubs  Behavior Characteristics Calm;Cooperative  Mood Pleasant  Thought Process  Coherency Circumstantial  Content Paranoia  Delusions Paranoid  Perception Hallucinations  Hallucination Auditory  Judgment Impaired  Confusion None  Danger to Self  Current suicidal ideation? Denies  Agreement Not to Harm Self Yes  Description of Agreement verbal    "

## 2024-02-15 NOTE — Group Note (Signed)
 [facility] LCSW Group Therapy Note   Group Date: 02/15/2024 Start Time: 1300 End Time: 1400   Type of Therapy/Topic:  Group Therapy:  Emotion Regulation  Participation Level:  Did Not Attend   Mood:  Description of Group:    The purpose of this group is to assist patients in learning to regulate negative emotions and experience positive emotions. Patients will be guided to discuss ways in which they have been vulnerable to their negative emotions. These vulnerabilities will be juxtaposed with experiences of positive emotions or situations, and patients challenged to use positive emotions to combat negative ones. Special emphasis will be placed on coping with negative emotions in conflict situations, and patients will process healthy conflict resolution skills.  Therapeutic Goals: Patient will identify two positive emotions or experiences to reflect on in order to balance out negative emotions:  Patient will label two or more emotions that they find the most difficult to experience:  Patient will be able to demonstrate positive conflict resolution skills through discussion or role plays:   Summary of Patient Progress: Patient declined to attend group.  Therapeutic Modalities:   Cognitive Behavioral Therapy Feelings Identification Dialectical Behavioral Therapy   Sherryle JINNY Margo, LCSW

## 2024-02-15 NOTE — Tx Team (Cosign Needed Addendum)
 Initial Treatment Plan 02/15/2024 1:04 AM Heather Kramer FMW:[id number]    PATIENT STRESSORS: Marital or family conflict     PATIENT STRENGTHS: Active sense of humor  Capable of independent living  Communication skills  General fund of knowledge  Motivation for treatment/growth    PATIENT IDENTIFIED PROBLEMS: paranoid  Having intrusive thoughts  Hearing voices                 DISCHARGE CRITERIA:  Ability to meet basic life and health needs Adequate post-discharge living arrangements Improved stabilization in mood, thinking, and/or behavior Need for constant or close observation no longer present Reduction of life-threatening or endangering symptoms to within safe limits Safe-care adequate arrangements made  PRELIMINARY DISCHARGE PLAN: Attend aftercare/continuing care group  PATIENT/FAMILY INVOLVEMENT: This treatment plan has been presented to and reviewed with the patient, Heather Kramer, and/or family member, .  The patient and family have been given the opportunity to ask questions and make suggestions.  Heather LOISE Gowda, RN 02/15/2024, 1:04 AM

## 2024-02-15 NOTE — Progress Notes (Signed)
 Patient in her room screaming with loud outburst stated the voices are bothering her. Endorses Passive SI and HI would not elaborate.Patient endorses feeling of agitation related to the voices in her heard,  Verbally contracts for safety. Prn Haldol  and Benadryl  give at 0851. Medications effective Patient is in the dayroom interacting well with Peers and Staff. Support ongoing.
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
## 2024-02-15 NOTE — Group Note (Signed)
 Date:  02/15/2024 Time:  8:50 PM  Group Topic/Focus:  Wrap-Up Group:   The focus of this group is to help patients review their daily goal of treatment and discuss progress on daily workbooks.    Participation Level:  Did Not Attend   Heather Kramer Getting 02/15/2024, 8:50 PM

## 2024-02-15 NOTE — BH IP Treatment Plan (Signed)
 Interdisciplinary Treatment and Diagnostic Plan Update  02/15/2024 Time of Session: 10:17 AMBRI Kramer MRN: [id number]  Principal Diagnosis: <principal problem not specified>  Secondary Diagnoses: Active Problems:   Schizophrenia (HCC)   Current Medications:  Current Facility-Administered Medications  Medication Dose Route Frequency Provider Last Rate Last Admin   haloperidol  (HALDOL ) tablet 5 mg  5 mg Oral TID PRN Trudy Carwin, NP   5 mg at 02/15/24 9148   And   diphenhydrAMINE  (BENADRYL ) capsule 50 mg  50 mg Oral TID PRN Trudy Carwin, NP   50 mg at 02/15/24 9148   haloperidol  lactate (HALDOL ) injection 5 mg  5 mg Intramuscular TID PRN Trudy Carwin, NP       And   diphenhydrAMINE  (BENADRYL ) injection 50 mg  50 mg Intramuscular TID PRN Trudy Carwin, NP       And   LORazepam  (ATIVAN ) injection 2 mg  2 mg Intramuscular TID PRN Trudy Carwin, NP       haloperidol  lactate (HALDOL ) injection 10 mg  10 mg Intramuscular TID PRN Trudy Carwin, NP       And   diphenhydrAMINE  (BENADRYL ) injection 50 mg  50 mg Intramuscular TID PRN Trudy Carwin, NP       And   LORazepam  (ATIVAN ) injection 2 mg  2 mg Intramuscular TID PRN Trudy Carwin, NP       feeding supplement (ENSURE PLUS HIGH PROTEIN) liquid 237 mL  237 mL Oral BID BM Jadapalle, Sree, MD   237 mL at 02/15/24 0854   nicotine  (NICODERM CQ  - dosed in mg/24 hours) patch 14 mg  14 mg Transdermal Daily Jadapalle, Sree, MD       PTA Medications: Medications Prior to Admission  Medication Sig Dispense Refill Last Dose/Taking   clonazePAM (KLONOPIN) 2 MG tablet Take 2 mg by mouth daily as needed for anxiety.   Past Week   haloperidol  (HALDOL ) 10 MG tablet Take 1 tablet (10 mg total) by mouth at bedtime. 30 tablet 0 02/14/2024   hydrOXYzine  (VISTARIL ) 25 MG capsule Take 25 mg by mouth 3 (three) times daily.   02/14/2024   risperiDONE  (RISPERDAL ) 3 MG tablet Take 3 mg by mouth 2 (two) times daily.   02/14/2024   temazepam (RESTORIL) 30 MG  capsule Take 30 mg by mouth at bedtime as needed for sleep.   Past Month   albuterol  (VENTOLIN  HFA) 108 (90 Base) MCG/ACT inhaler Inhale 1-2 puffs into the lungs every 6 (six) hours as needed for wheezing or shortness of breath. (Patient not taking: Reported on 02/14/2024) 1 each 0 Not Taking   benztropine  (COGENTIN ) 1 MG tablet Take 1 mg by mouth 2 (two) times daily. (Patient not taking: Reported on 02/15/2024)   Not Taking   mirtazapine (REMERON) 15 MG tablet Take 15 mg by mouth at bedtime. (Patient not taking: Reported on 02/14/2024)   Not Taking   traZODone  (DESYREL ) 50 MG tablet Take 50 mg by mouth at bedtime. (Patient not taking: Reported on 02/14/2024)   Not Taking    Patient Stressors: Marital or family conflict    Patient Strengths: Active sense of humor  Capable of independent living  Communication skills  General fund of knowledge  Motivation for treatment/growth   Treatment Modalities: Medication Management, Group therapy, Case management,  1 to 1 session with clinician, Psychoeducation, Recreational therapy.   Physician Treatment Plan for Primary Diagnosis: <principal problem not specified> Long Term Goal(s):     Short Term Goals:    Medication Management: Evaluate  patient's response, side effects, and tolerance of medication regimen.  Therapeutic Interventions: 1 to 1 sessions, Unit Group sessions and Medication administration.  Evaluation of Outcomes: Not Met  Physician Treatment Plan for Secondary Diagnosis: Active Problems:   Schizophrenia (HCC)  Long Term Goal(s):     Short Term Goals:       Medication Management: Evaluate patient's response, side effects, and tolerance of medication regimen.  Therapeutic Interventions: 1 to 1 sessions, Unit Group sessions and Medication administration.  Evaluation of Outcomes: Not Met   RN Treatment Plan for Primary Diagnosis: <principal problem not specified> Long Term Goal(s): Knowledge of disease and therapeutic regimen to  maintain health will improve  Short Term Goals: Ability to remain free from injury will improve, Ability to verbalize frustration and anger appropriately will improve, Ability to demonstrate self-control, Ability to participate in decision making will improve, Ability to verbalize feelings will improve, Ability to disclose and discuss suicidal ideas, Ability to identify and develop effective coping behaviors will improve, and Compliance with prescribed medications will improve  Medication Management: RN will administer medications as ordered by provider, will assess and evaluate patient's response and provide education to patient for prescribed medication. RN will report any adverse and/or side effects to prescribing provider.  Therapeutic Interventions: 1 on 1 counseling sessions, Psychoeducation, Medication administration, Evaluate responses to treatment, Monitor vital signs and CBGs as ordered, Perform/monitor CIWA, COWS, AIMS and Fall Risk screenings as ordered, Perform wound care treatments as ordered.  Evaluation of Outcomes: Not Met   LCSW Treatment Plan for Primary Diagnosis: <principal problem not specified> Long Term Goal(s): Safe transition to appropriate next level of care at discharge, Engage patient in therapeutic group addressing interpersonal concerns.  Short Term Goals: Engage patient in aftercare planning with referrals and resources, Increase social support, Increase ability to appropriately verbalize feelings, Increase emotional regulation, Facilitate acceptance of mental health diagnosis and concerns, Facilitate patient progression through stages of change regarding substance use diagnoses and concerns, Identify triggers associated with mental health/substance abuse issues, and Increase skills for wellness and recovery  Therapeutic Interventions: Assess for all discharge needs, 1 to 1 time with Social worker, Explore available resources and support systems, Assess for adequacy in  community support network, Educate family and significant other(s) on suicide prevention, Complete Psychosocial Assessment, Interpersonal group therapy.  Evaluation of Outcomes: Not Met   Progress in Treatment: Attending groups: No. Participating in groups: No. Taking medication as prescribed: Yes. Toleration medication: Yes. Family/Significant other contact made: No, will contact:  when given permission. Patient understands diagnosis: Yes. Discussing patient identified problems/goals with staff: Yes. Medical problems stabilized or resolved: Yes. Denies suicidal/homicidal ideation: Yes. Issues/concerns per patient self-inventory: No. Other: none.  New problem(s) identified: No, Describe:  none identified.  New Short Term/Long Term Goal(s): elimination of symptoms of psychosis, medication management for mood stabilization; elimination of SI thoughts; development of comprehensive mental wellness/sobriety plan.  Patient Goals: Hurry up and get home so I can have a cigarette. Pt also reported desire to decrease anxiety and depression.    Discharge Plan or Barriers: CSW will assist pt with development of an appropriate aftercare/discharge plan.   Reason for Continuation of Hospitalization: Anxiety Depression Hallucinations Homicidal ideation Medication stabilization  Estimated Length of Stay: 1-7 days  Last 3 [place] Suicide Severity Risk Score: Flowsheet Row Admission (Current) from 02/15/2024 in [facility] INPATIENT BE[facility]ICINE ED from 02/14/2024 in [facility] UC from 11/03/2023 in [facility] RISK CATEGORY  Low Risk Low Risk No Risk    Last PHQ 2/9 Scores:    02/14/2024   12:27 PM  Depression screen PHQ 2/9  Decreased Interest 1  Down, Depressed, Hopeless 0  PHQ - 2 Score 1    Scribe for Treatment Team: Nadara JONELLE Fam, LCSW 02/15/2024 10:47 AM

## 2024-02-16 ENCOUNTER — Encounter: Payer: Self-pay | Admitting: Psychiatry

## 2024-02-16 MED ORDER — RISPERIDONE 1 MG PO TABS
3.0000 mg | ORAL_TABLET | Freq: Two times a day (BID) | ORAL | Status: AC
  Administered 2024-02-16 – 2024-02-17 (×3): 3 mg via ORAL
  Filled 2024-02-16 (×3): qty 3

## 2024-02-16 MED ORDER — INFLUENZA VIRUS VACC SPLIT PF (FLUZONE) 0.5 ML IM SUSY
0.5000 mL | PREFILLED_SYRINGE | INTRAMUSCULAR | Status: AC
  Administered 2024-02-17: 0.5 mL via INTRAMUSCULAR
  Filled 2024-02-16: qty 0.5

## 2024-02-16 MED ORDER — TRAZODONE HCL 100 MG PO TABS
100.0000 mg | ORAL_TABLET | Freq: Every evening | ORAL | Status: AC | PRN
  Administered 2024-02-16: 100 mg via ORAL
  Filled 2024-02-16: qty 1

## 2024-02-16 NOTE — Group Note (Signed)
 LCSW Group Therapy Note   Group Date: 02/16/2024 Start Time: 1300 End Time: 1400   Type of Therapy and Topic:  Group Therapy: Challenging Core Beliefs  Participation Level:  Did Not Attend  Description of Group:  Patients were educated about core beliefs and asked to identify one harmful core belief that they have. Patients were asked to explore from where those beliefs originate. Patients were asked to discuss how those beliefs make them feel and the resulting behaviors of those beliefs. They were then be asked if those beliefs are true and, if so, what evidence they have to support them. Lastly, group members were challenged to replace those negative core beliefs with helpful beliefs.   Therapeutic Goals:   1. Patient will identify harmful core beliefs and explore the origins of such beliefs. 2. Patient will identify feelings and behaviors that result from those core beliefs. 3. Patient will discuss whether such beliefs are true. 4.  Patient will replace harmful core beliefs with helpful ones.  Summary of Patient Progress: Patient did not attend.   Therapeutic Modalities: Cognitive Behavioral Therapy; Solution-Focused Therapy   Raygen Linquist M Avary Pitsenbarger, LCSWA 02/16/2024  2:04 PM

## 2024-02-16 NOTE — Group Note (Signed)
 Date:  02/16/2024 Time:  1:29 PM  Group Topic/Focus:  Goals Group:   The focus of this group is to help patients establish daily goals to achieve during treatment and discuss how the patient can incorporate goal setting into their daily lives to aide in recovery.    Participation Level:  Did Not Attend   Heather Kramer Heather Kramer 02/16/2024, 1:29 PM

## 2024-02-16 NOTE — Group Note (Signed)
 Date:  02/16/2024 Time:  8:51 PM  Group Topic/Focus:  Personal Choices and Values:   The focus of this group is to help patients assess and explore the importance of values in their lives, how their values affect their decisions, how they express their values and what opposes their expression.    Pt did not attend group.  Cloud Graham L 02/16/2024, 8:51 PM

## 2024-02-16 NOTE — Progress Notes (Signed)
 [facility] MD Progress Note  02/16/2024 1:51 PM Heather Kramer  MRN:  [id number]   Subjective:  Chart reviewed, case discussed in multidisciplinary meeting, patient seen during rounds.   2/5: Patient found in room during rounds. She reports she is good. She endorses AVH of her friends I used to know, talk too much. She denies SI and HI. She reports she was sleeping good endorsing sleeping 4 hours.   Of note nursing reports the patient was screaming all night. She required PRN agitation oral medications each morning.   Past Psychiatric History: see h&P Family History:  Family History  Problem Relation Age of Onset   Obesity Mother    Heart disease Father    Cancer Maternal Grandmother    Diabetes Maternal Grandfather    Social History:  Social History   Substance and Sexual Activity  Alcohol Use Yes   Comment: occasionally     Social History   Substance and Sexual Activity  Drug Use Not Currently   Types: Marijuana    Social History   Socioeconomic History   Marital status: Single    Spouse name: Not on file   Number of children: Not on file   Years of education: Not on file   Highest education level: 12th grade  Occupational History   Not on file  Tobacco Use   Smoking status: Every Day    Current packs/day: 2.00    Types: Cigarettes   Smokeless tobacco: Never  Vaping Use   Vaping status: Never Used  Substance and Sexual Activity   Alcohol use: Yes    Comment: occasionally   Drug use: Not Currently    Types: Marijuana   Sexual activity: Not Currently  Other Topics Concern   Not on file  Social History Narrative   Lives with mother and brother. 9th grade at [employer]. Plays basketball.    Social Drivers of Health   Tobacco Use: High Risk (02/15/2024)   Patient History    Smoking Tobacco Use: Every Day    Smokeless Tobacco Use: Never    Passive Exposure: Not on file  Financial Resource Strain: Not on File (10/14/2021)   Received from  [employer] [facility]    Financial Resource Strain: 0  Food Insecurity: No Food Insecurity (02/15/2024)   Epic    Worried About Programme Researcher, Broadcasting/film/video in the Last Year: Never true    Ran Out of Food in the Last Year: Never true  Transportation Needs: No Transportation Needs (02/15/2024)   Epic    Lack of Transportation (Medical): No    Lack of Transportation (Non-Medical): No  Physical Activity: Not on File (10/14/2021)   Received from [facility]   Physical Activity    Physical Activity: 0  Stress: Not on File (10/14/2021)   Received from [facility]   Stress    Stress: 0  Social Connections: Not on File (09/20/2022)   Received from [facility]   Social Connections    Connectedness: 0  Depression (PHQ2-9): Low Risk (02/14/2024)   Depression (PHQ2-9)    PHQ-2 Score: 1  Alcohol Screen: Low Risk (02/15/2024)   Alcohol Screen    Last Alcohol Screening Score (AUDIT): 0  Housing: Low Risk (02/15/2024)   Epic    Unable to Pay for Housing in the Last Year: No    Number of Times Moved in the Last Year: 0    Homeless in the Last Year: No  Utilities: Not At Risk (02/15/2024)  Epic    Threatened with loss of utilities: No  Health Literacy: Not on file   Past Medical History:  Past Medical History:  Diagnosis Date   Acquired acanthosis nigricans    Allergies    Asthma    Goiter    Goiter    Overweight(278.02)    Pre-diabetes    Schizophrenia (HCC)    Thyroiditis, autoimmune     Past Surgical History:  Procedure Laterality Date   MOUTH SURGERY      Current Medications: Current Facility-Administered Medications  Medication Dose Route Frequency Provider Last Rate Last Admin   benztropine  (COGENTIN ) tablet 1 mg  1 mg Oral BID Keshav Winegar, NP   1 mg at 02/16/24 9183   haloperidol  (HALDOL ) tablet 5 mg  5 mg Oral TID PRN Trudy Carwin, NP   5 mg at 02/16/24 9183   And   diphenhydrAMINE  (BENADRYL ) capsule 50 mg  50 mg Oral TID PRN Trudy Carwin, NP   50 mg at 02/16/24 9183   haloperidol  lactate  (HALDOL ) injection 5 mg  5 mg Intramuscular TID PRN Trudy Carwin, NP       And   diphenhydrAMINE  (BENADRYL ) injection 50 mg  50 mg Intramuscular TID PRN Trudy Carwin, NP       And   LORazepam  (ATIVAN ) injection 2 mg  2 mg Intramuscular TID PRN Trudy Carwin, NP       haloperidol  lactate (HALDOL ) injection 10 mg  10 mg Intramuscular TID PRN Trudy Carwin, NP       And   diphenhydrAMINE  (BENADRYL ) injection 50 mg  50 mg Intramuscular TID PRN Trudy Carwin, NP       And   LORazepam  (ATIVAN ) injection 2 mg  2 mg Intramuscular TID PRN Trudy Carwin, NP       feeding supplement (ENSURE PLUS HIGH PROTEIN) liquid 237 mL  237 mL Oral BID BM Jadapalle, Sree, MD   237 mL at 02/15/24 0854   haloperidol  (HALDOL ) tablet 2 mg  2 mg Oral QHS Hali Balgobin, NP   2 mg at 02/15/24 2115   hydrOXYzine  (ATARAX ) tablet 25 mg  25 mg Oral TID PRN Darrold Bezek, NP       [START ON 02/17/2024] influenza vac split trivalent PF (FLUZONE ) injection 0.5 mL  0.5 mL Intramuscular Tomorrow-1000 Donnelly Mellow, MD       nicotine  (NICODERM CQ  - dosed in mg/24 hours) patch 14 mg  14 mg Transdermal Daily Jadapalle, Sree, MD       risperiDONE  (RISPERDAL ) tablet 6 mg  6 mg Oral QHS Gerardo Territo, NP   6 mg at 02/15/24 2115   traZODone  (DESYREL ) tablet 50 mg  50 mg Oral QHS PRN Almeter Westhoff, NP        Lab Results: No results found for this or any previous visit (from the past 48 hours).  Blood Alcohol level:  Lab Results  Component Value Date   [facility] <15 02/14/2024   ETH <15 07/23/2023    Metabolic Disorder Labs: Lab Results  Component Value Date   HGBA1C 5.1 02/14/2024   MPG 99.67 02/14/2024   MPG 99.67 04/05/2023   Lab Results  Component Value Date   PROLACTIN 96.0 (H) 07/23/2023   PROLACTIN 26.5 (H) 06/13/2020   Lab Results  Component Value Date   CHOL 217 (H) 02/14/2024   TRIG 105 02/14/2024   HDL 37 (L) 02/14/2024   CHOLHDL 5.9 02/14/2024   VLDL 21 02/14/2024   LDLCALC 159 (H) 02/14/2024  LDLCALC 109 (H) 07/23/2023     Physical Findings: AIMS:  , ,  ,  ,    CIWA:    COWS:      Psychiatric Specialty Exam:  Presentation  General Appearance:  Disheveled  Eye Contact: Fair  Speech: Normal Rate; Clear and Coherent  Speech Volume: Normal    Mood and Affect  Mood: Anxious  Affect: Appropriate   Thought Process  Thought Processes: Coherent; Linear  Orientation:Full (Time, Place and Person)  Thought Content:Logical  Hallucinations:Hallucinations: Auditory; Visual Description of Auditory Hallucinations: friends Description of Visual Hallucinations: seeing old friends  Ideas of Reference:Delusions  Suicidal Thoughts:Suicidal Thoughts: No  Homicidal Thoughts:Homicidal Thoughts: No   Sensorium  Memory: Immediate Fair; Recent Fair; Remote Fair  Judgment: Poor  Insight: Poor   Executive Functions  Concentration: Poor  Attention Span: Poor  Recall: Poor  Fund of Knowledge: Fair  Language: Fair   Psychomotor Activity  Psychomotor Activity: Psychomotor Activity: Increased; Restlessness  Musculoskeletal: Strength & Muscle Tone: within normal limits Gait & Station: normal Assets  Assets: Housing; Social Support; Desire for Improvement    Physical Exam: Physical Exam Vitals and nursing note reviewed.  Constitutional:      Appearance: Normal appearance.  Pulmonary:     Effort: Pulmonary effort is normal.  Neurological:     Mental Status: She is alert and oriented to person, place, and time.    Review of Systems  Respiratory:  Negative for shortness of breath.   Cardiovascular:  Negative for chest pain.  Gastrointestinal:  Negative for diarrhea, nausea and vomiting.  Psychiatric/Behavioral:  Positive for depression, hallucinations and substance abuse. Negative for suicidal ideas. The patient is not nervous/anxious.   All other systems reviewed and are negative.  Blood pressure (!) 141/93, pulse (!) 132, temperature 98.3 F (36.8 C),  temperature source Oral, resp. rate 19, height 6' (1.829 m), weight 92.1 kg, SpO2 100%. Body mass index is 27.53 kg/m.  Diagnosis: Active Problems:   Schizophrenia (HCC)   PLAN: Safety and Monitoring:  -- Voluntary admission to inpatient psychiatric unit for safety, stabilization and treatment  -- Daily contact with patient to assess and evaluate symptoms and progress in treatment  -- Patient's case to be discussed in multi-disciplinary team meeting  -- Observation Level : q15 minute checks  -- Vital signs:  q12 hours  -- Precautions: suicide, elopement, and assault -- Encouraged patient to participate in unit milieu and in scheduled group therapies  2. Psychiatric Treatment:  Scheduled Medications: Haldol  2 mg nightly  Risperdal  3 mg BID Cogentin  1 mg BID Trazodone  100 mg nightly Atarax  25 mg TID PRN for anxiety.   **Qtc 442 on 02/15/24    -- The risks/benefits/side-effects/alternatives to this medication were discussed in detail with the patient and time was given for questions. The patient consents to medication trial.  3. Medical Issues Being Addressed:  No current complaints  4. Discharge Planning:   -- Social work and case management to assist with discharge planning and identification of hospital follow-up needs prior to discharge  -- Estimated LOS: 5-7 days  Nohelani Benning, NP 02/16/2024, 1:51 PM

## 2024-02-16 NOTE — Progress Notes (Signed)
 Patient spit out scheduled Cogentin , stating I can't take that, it makes my pain worse. NP was notified face-to-face.

## 2024-02-16 NOTE — Group Note (Signed)
 Recreation Therapy Group Note   Group Topic:Animal Assisted Therapy   Group Date: 02/16/2024 Start Time: 1045 End Time: 1100 Facilitators: Celestia Jeoffrey BRAVO, LRT, CTRS Location: Dayroom  Group Description: AAA. Animal-Assisted Activity provides opportunities for motivational, educational, therapeutic and/or recreational benefits to enhance quality of life. Selinda and Rollo visited the unit to interact with patients.   Goal Areas Addressed:  Reduced anxiety and stress Improved mood Increased social interaction Enhanced communication skills Reduced loneliness and isolation Improved emotional regulation   Affect/Mood: N/A   Participation Level: Did not attend    Clinical Observations/Individualized Feedback: Patient did not attend.  Plan: Continue to engage patient in RT group sessions 2-3x/week.   Jeoffrey BRAVO Celestia, LRT, CTRS 02/16/2024 11:33 AM

## 2024-02-16 NOTE — Group Note (Signed)
 Recreation Therapy Group Note   Group Topic:Coping Skills  Group Date: 02/16/2024 Start Time: 1530 End Time: 1615 Facilitators: Celestia Jeoffrey BRAVO, LRT, CTRS Location: [facility]  Group Description: Painting a Diplomatic Services Operational Officer. Participants will engage in a guided art activity focused on painting a personal peaceful place. This group encourages relaxation, self-expression, and mindfulness through creative exploration. Participants are invited to imagine a location where they feel calm, safe, or content and represent it using paint and other art materials. Emphasis is placed on the process rather than artistic skill, allowing individuals to express emotions, reduce stress, and increase self-awareness in a supportive group setting.  Goal Area(s) Addressed:   Patient will promote relaxation and stress reduction  Patient will enhance emotional expression and self-awareness  Patient will be given support in coping skills and grounding techniques  Patient will be encouraged to use creativity and positive leisure engagement  Patient will foster social interaction and group participation   Affect/Mood: N/A   Participation Level: Did not attend    Clinical Observations/Individualized Feedback: Patient did not attend.  Plan: Continue to engage patient in RT group sessions 2-3x/week.   Jeoffrey BRAVO Celestia, LRT, CTRS 02/16/2024 4:59 PM

## 2024-02-16 NOTE — Plan of Care (Signed)

## 2024-02-16 NOTE — Progress Notes (Signed)
 Patient had complaints of auditory hallucinations, so this clinical research associate gave patient PRN Haldol  and Benadryl  to help with symptoms. Patient only took 25 mg of Benadryl  and wouldn't take the other capsule, stating I don't need it. NP was notified face-to-face.

## 2024-02-16 NOTE — Progress Notes (Signed)
 Patient refused scheduled Cogentin  again, stating it makes my heart skip a beat.

## 2024-02-16 NOTE — Plan of Care (Signed)
   Problem: Education: Goal: Emotional status will improve Outcome: Progressing

## 2024-02-16 NOTE — Progress Notes (Signed)
" °   02/16/24 0500  Psych Admission Type (Psych Patients Only)  Admission Status Voluntary  Psychosocial Assessment  Patient Complaints Self-harm behaviors  Eye Contact Brief  Facial Expression Flat  Affect Irritable  Speech Logical/coherent  Interaction Minimal  Motor Activity Slow  Appearance/Hygiene In scrubs  Behavior Characteristics Irritable  Mood Preoccupied  Thought Process  Coherency Circumstantial  Content Delusions  Delusions Paranoid  Perception Hallucinations  Hallucination Auditory  Judgment Impaired  Confusion None  Danger to Self  Current suicidal ideation? Passive  Agreement Not to Harm Self Yes    "

## 2024-02-16 NOTE — Progress Notes (Signed)
" °   02/16/24 1500  Psych Admission Type (Psych Patients Only)  Admission Status Voluntary  Psychosocial Assessment  Patient Complaints Self-harm thoughts;Other (Comment) (hallucinations)  Eye Contact Fair;Watchful  Facial Expression Anxious  Affect Preoccupied  Speech Logical/coherent  Interaction Assertive;Minimal;Isolative (isolates to room, except for meals and medication)  Motor Activity Slow  Appearance/Hygiene In scrubs  Behavior Characteristics Cooperative  Mood Preoccupied;Pleasant  Aggressive Behavior  Effect No apparent injury  Thought Process  Coherency WDL  Content Preoccupation  Delusions None reported or observed  Perception Hallucinations  Hallucination Auditory;Visual (patient states she hears voices, but can't tell what they are saying; also stated that she sees a lot of people.)  Judgment Limited  Confusion None  Danger to Self  Current suicidal ideation? Passive  Self-Injurious Behavior Some self-injurious ideation observed or expressed.  No lethal plan expressed   Agreement Not to Harm Self Yes  Description of Agreement Verbal  Danger to Others  Danger to Others None reported or observed    "

## 2024-02-17 NOTE — Plan of Care (Signed)

## 2024-02-17 NOTE — Group Note (Signed)
 Recreation Therapy Group Note   Group Topic:Leisure Education  Group Date: 02/17/2024 Start Time: 1530 End Time: 1620 Facilitators: Celestia Jeoffrey FORBES ARTICE, CTRS Location: Craft Room  Group Description: Leisure. Patients were given the option to choose from journaling, coloring, drawing, making origami, playing with playdoh, listening to music or singing karaoke. LRT and pts discussed the meaning of leisure, the importance of participating in leisure during their free time/when they're outside of the hospital, as well as how our leisure interests can also serve as coping skills.   Goal Area(s) Addressed:  Patient will identify a current leisure interest.  Patient will learn the definition of leisure. Patient will practice making a positive decision. Patient will have the opportunity to try a new leisure activity. Patient will communicate with peers and LRT.    Affect/Mood: N/A   Participation Level: Did not attend    Clinical Observations/Individualized Feedback: Patient did not attend.  Plan: Continue to engage patient in RT group sessions 2-3x/week.   Jeoffrey FORBES Celestia, LRT, CTRS 02/17/2024 5:02 PM

## 2024-02-17 NOTE — Group Note (Signed)
 Date:  02/17/2024 Time:  8:53 PM  Group Topic/Focus:  Identifying Needs:   The focus of this group is to help patients identify their personal needs that have been historically problematic and identify healthy behaviors to address their needs. Making Healthy Choices:   The focus of this group is to help patients identify negative/unhealthy choices they were using prior to admission and identify positive/healthier coping strategies to replace them upon discharge. Managing Feelings:   The focus of this group is to identify what feelings patients have difficulty handling and develop a plan to handle them in a healthier way upon discharge.    Pt did not attend group.  Zygmund Passero L 02/17/2024, 8:53 PM

## 2024-02-17 NOTE — [facility] Group Notes (Signed)
 Spirituality Group   Description: Participant directed exploration of values, beliefs and meaning   **Focus on Gratitude: Invite reflection on sources of gratitude (external/internal); goal to invite internal gratitude to foster 1) reconnection with life-giving activities 2) self-compassion.   Following a brief framework of chaplains role and ground rules of group behavior, participants are invited to share concerns or questions that engage spiritual life. Emphasis placed on common themes and shared experiences and ways to make meaning and clarify living into ones values.   Theory/Process/Goal: Utilize the theoretical framework of group therapy established by Celena Kite, Relational Cultural Theory and Rogerian approaches to facilitate relational empathy and use of the here and now to foster reflection, self-awareness, and sharing.   Observations: Came in and out of group, unable to engage and disruptive, delusional.  Aprille Sawhney L. Delores HERO.Div

## 2024-02-17 NOTE — Progress Notes (Signed)
" °   02/17/24 1000  Psychosocial Assessment  Patient Complaints None  Eye Contact Fair  Facial Expression Flat  Affect Flat  Speech Logical/coherent  Interaction Minimal  Motor Activity Slow  Appearance/Hygiene Unremarkable  Behavior Characteristics Cooperative;Appropriate to situation  Mood Pleasant;Preoccupied  Thought Process  Coherency WDL  Content Preoccupation  Delusions None reported or observed  Perception Hallucinations  Hallucination Auditory;Visual  Judgment Limited  Confusion None  Danger to Self  Current suicidal ideation? Denies  Self-Injurious Behavior No self-injurious ideation or behavior indicators observed or expressed   Agreement Not to Harm Self Yes  Description of Agreement verbal  Danger to Others  Danger to Others None reported or observed    "

## 2024-02-17 NOTE — Progress Notes (Cosign Needed)
 [facility] MD Progress Note  02/17/2024 3:33 PM HADLEA FURUYA  MRN:  [id number]   Subjective:  Chart reviewed, case discussed in multidisciplinary meeting, patient seen during rounds.  2/6: Patient found resting in her room. She continues to endorse AVH. She denies SI and HI. She reports not going to the bathroom for several days but denies any medication for it. She still shows irrelevant thinking at times during assessment. Nursing reports she slept okay last night and was not screaming.    2/5: Patient found in room during rounds. She reports she is good. She endorses AVH of her friends I used to know, talk too much. She denies SI and HI. She reports she was sleeping good endorsing sleeping 4 hours.   Of note nursing reports the patient was screaming all night. She required PRN agitation oral medications each morning.   Past Psychiatric History: see h&P Family History:  Family History  Problem Relation Age of Onset   Obesity Mother    Heart disease Father    Cancer Maternal Grandmother    Diabetes Maternal Grandfather    Social History:  Social History   Substance and Sexual Activity  Alcohol Use Yes   Comment: occasionally     Social History   Substance and Sexual Activity  Drug Use Not Currently   Types: Marijuana    Social History   Socioeconomic History   Marital status: Single    Spouse name: Not on file   Number of children: Not on file   Years of education: Not on file   Highest education level: 12th grade  Occupational History   Not on file  Tobacco Use   Smoking status: Every Day    Current packs/day: 2.00    Types: Cigarettes   Smokeless tobacco: Never  Vaping Use   Vaping status: Never Used  Substance and Sexual Activity   Alcohol use: Yes    Comment: occasionally   Drug use: Not Currently    Types: Marijuana   Sexual activity: Not Currently  Other Topics Concern   Not on file  Social History Narrative   Lives with mother and brother. 9th  grade at [employer]. Plays basketball.    Social Drivers of Health   Tobacco Use: High Risk (02/16/2024)   Patient History    Smoking Tobacco Use: Every Day    Smokeless Tobacco Use: Never    Passive Exposure: Not on file  Financial Resource Strain: Not on File (10/14/2021)   Received from [facility]    Financial Resource Strain: 0  Food Insecurity: No Food Insecurity (02/15/2024)   Epic    Worried About Programme Researcher, Broadcasting/film/video in the Last Year: Never true    Ran Out of Food in the Last Year: Never true  Transportation Needs: No Transportation Needs (02/15/2024)   Epic    Lack of Transportation (Medical): No    Lack of Transportation (Non-Medical): No  Physical Activity: Not on File (10/14/2021)   Received from [facility]   Physical Activity    Physical Activity: 0  Stress: Not on File (10/14/2021)   Received from [facility]   Stress    Stress: 0  Social Connections: Not on File (09/20/2022)   Received from [facility]   Social Connections    Connectedness: 0  Depression (PHQ2-9): Low Risk (02/14/2024)   Depression (PHQ2-9)    PHQ-2 Score: 1  Alcohol Screen: Low Risk (02/15/2024)   Alcohol Screen  Last Alcohol Screening Score (AUDIT): 0  Housing: Low Risk (02/15/2024)   Epic    Unable to Pay for Housing in the Last Year: No    Number of Times Moved in the Last Year: 0    Homeless in the Last Year: No  Utilities: Not At Risk (02/15/2024)   Epic    Threatened with loss of utilities: No  Health Literacy: Not on file   Past Medical History:  Past Medical History:  Diagnosis Date   Acquired acanthosis nigricans    Allergies    Asthma    Goiter    Goiter    Overweight(278.02)    Pre-diabetes    Schizophrenia (HCC)    Thyroiditis, autoimmune     Past Surgical History:  Procedure Laterality Date   MOUTH SURGERY      Current Medications: Current Facility-Administered Medications  Medication Dose Route Frequency Provider Last Rate Last Admin    benztropine  (COGENTIN ) tablet 1 mg  1 mg Oral BID Henok Heacock, NP   1 mg at 02/15/24 1759   haloperidol  (HALDOL ) tablet 5 mg  5 mg Oral TID PRN Trudy Carwin, NP   5 mg at 02/16/24 9183   And   diphenhydrAMINE  (BENADRYL ) capsule 50 mg  50 mg Oral TID PRN Trudy Carwin, NP   50 mg at 02/16/24 9183   haloperidol  lactate (HALDOL ) injection 5 mg  5 mg Intramuscular TID PRN Trudy Carwin, NP       And   diphenhydrAMINE  (BENADRYL ) injection 50 mg  50 mg Intramuscular TID PRN Trudy Carwin, NP       And   LORazepam  (ATIVAN ) injection 2 mg  2 mg Intramuscular TID PRN Trudy Carwin, NP       haloperidol  lactate (HALDOL ) injection 10 mg  10 mg Intramuscular TID PRN Trudy Carwin, NP       And   diphenhydrAMINE  (BENADRYL ) injection 50 mg  50 mg Intramuscular TID PRN Trudy Carwin, NP       And   LORazepam  (ATIVAN ) injection 2 mg  2 mg Intramuscular TID PRN Trudy Carwin, NP       feeding supplement (ENSURE PLUS HIGH PROTEIN) liquid 237 mL  237 mL Oral BID BM Jadapalle, Sree, MD   237 mL at 02/17/24 1033   haloperidol  (HALDOL ) tablet 2 mg  2 mg Oral QHS Baya Lentz, NP   2 mg at 02/16/24 2119   hydrOXYzine  (ATARAX ) tablet 25 mg  25 mg Oral TID PRN Vivi Piccirilli, NP   25 mg at 02/16/24 2120   nicotine  (NICODERM CQ  - dosed in mg/24 hours) patch 14 mg  14 mg Transdermal Daily Jadapalle, Sree, MD       risperiDONE  (RISPERDAL ) tablet 3 mg  3 mg Oral BID Dorien Bessent, NP   3 mg at 02/17/24 9173   traZODone  (DESYREL ) tablet 100 mg  100 mg Oral QHS PRN Vidit Boissonneault, NP   100 mg at 02/16/24 2120    Lab Results: No results found for this or any previous visit (from the past 48 hours).  Blood Alcohol level:  Lab Results  Component Value Date   [facility] <15 02/14/2024   ETH <15 07/23/2023    Metabolic Disorder Labs: Lab Results  Component Value Date   HGBA1C 5.1 02/14/2024   MPG 99.67 02/14/2024   MPG 99.67 04/05/2023   Lab Results  Component Value Date   PROLACTIN 96.0 (H) 07/23/2023   PROLACTIN 26.5 (H)  06/13/2020   Lab Results  Component  Value Date   CHOL 217 (H) 02/14/2024   TRIG 105 02/14/2024   HDL 37 (L) 02/14/2024   CHOLHDL 5.9 02/14/2024   VLDL 21 02/14/2024   LDLCALC 159 (H) 02/14/2024   LDLCALC 109 (H) 07/23/2023    Physical Findings: AIMS:  , ,  ,  ,    CIWA:    COWS:      Psychiatric Specialty Exam:  Presentation  General Appearance:  Disheveled  Eye Contact: Fair  Speech: Normal Rate; Clear and Coherent  Speech Volume: Normal    Mood and Affect  Mood: Anxious  Affect: Appropriate   Thought Process  Thought Processes: Coherent; Linear  Orientation:Full (Time, Place and Person)  Thought Content:Logical  Hallucinations:Hallucinations: Auditory; Visual Description of Auditory Hallucinations: friends Description of Visual Hallucinations: seeing old friends  Ideas of Reference:Delusions  Suicidal Thoughts:Suicidal Thoughts: No  Homicidal Thoughts:Homicidal Thoughts: No   Sensorium  Memory: Immediate Fair; Recent Fair; Remote Fair  Judgment: Poor  Insight: Poor   Executive Functions  Concentration: Poor  Attention Span: Poor  Recall: Poor  Fund of Knowledge: Fair  Language: Fair   Psychomotor Activity  Psychomotor Activity: Psychomotor Activity: Increased; Restlessness  Musculoskeletal: Strength & Muscle Tone: within normal limits Gait & Station: normal Assets  Assets: Housing; Social Support; Desire for Improvement    Physical Exam: Physical Exam Vitals and nursing note reviewed.  Constitutional:      Appearance: Normal appearance.  Pulmonary:     Effort: Pulmonary effort is normal.  Neurological:     Mental Status: She is alert and oriented to person, place, and time.    Review of Systems  Respiratory:  Negative for shortness of breath.   Cardiovascular:  Negative for chest pain.  Gastrointestinal:  Negative for diarrhea, nausea and vomiting.  Psychiatric/Behavioral:  Positive for  depression, hallucinations and substance abuse. Negative for suicidal ideas. The patient is not nervous/anxious.   All other systems reviewed and are negative.  Blood pressure 109/74, pulse 71, temperature 98.3 F (36.8 C), temperature source Oral, resp. rate 17, height 6' (1.829 m), weight 92.1 kg, SpO2 100%. Body mass index is 27.53 kg/m.  Diagnosis: Active Problems:   Schizophrenia (HCC)   PLAN: Safety and Monitoring:  -- Voluntary admission to inpatient psychiatric unit for safety, stabilization and treatment  -- Daily contact with patient to assess and evaluate symptoms and progress in treatment  -- Patient's case to be discussed in multi-disciplinary team meeting  -- Observation Level : q15 minute checks  -- Vital signs:  q12 hours  -- Precautions: suicide, elopement, and assault -- Encouraged patient to participate in unit milieu and in scheduled group therapies  2. Psychiatric Treatment:  Scheduled Medications: Haldol  2 mg nightly  Risperdal  3 mg BID Cogentin  1 mg BID Trazodone  100 mg nightly Atarax  25 mg TID PRN for anxiety.   **Qtc 442 on 02/15/24    -- The risks/benefits/side-effects/alternatives to this medication were discussed in detail with the patient and time was given for questions. The patient consents to medication trial.  3. Medical Issues Being Addressed:  No current complaints  4. Discharge Planning:   -- Social work and case management to assist with discharge planning and identification of hospital follow-up needs prior to discharge  -- Estimated LOS: 5-7 days  Eryck Negron, NP 02/17/2024, 3:33 PM

## 2024-02-17 NOTE — Group Note (Addendum)
 [facility] LCSW Group Therapy Note   Group Date: 02/17/2024 Start Time: 14:15 End Time: 15:10   Type of Therapy/Topic:  Group Therapy:  Emotion Regulation  Participation Level:  Did Not Attend   Mood:  Description of Group:    The purpose of this group is to assist patients in learning to regulate negative emotions and experience positive emotions. Patients will be guided to discuss ways in which they have been vulnerable to their negative emotions. These vulnerabilities will be juxtaposed with experiences of positive emotions or situations, and patients challenged to use positive emotions to combat negative ones. Special emphasis will be placed on coping with negative emotions in conflict situations, and patients will process healthy conflict resolution skills.  Therapeutic Goals: Patient will identify two positive emotions or experiences to reflect on in order to balance out negative emotions:  Patient will label two or more emotions that they find the most difficult to experience:  Patient will be able to demonstrate positive conflict resolution skills through discussion or role plays:   Summary of Patient Progress:   Patient did not attend.     Therapeutic Modalities:   Cognitive Behavioral Therapy Feelings Identification Dialectical Behavioral Therapy   Alveta CHRISTELLA Kerns, LCSW

## 2024-02-17 NOTE — Progress Notes (Signed)
 Patient was heard laughing loudly in her room, by herself.

## 2024-02-17 NOTE — Group Note (Signed)
 Recreation Therapy Group Note   Group Topic:Stress Management  Group Date: 02/17/2024 Start Time: 1100 End Time: 1130 Facilitators: Celestia Jeoffrey BRAVO, LRT, CTRS Location: Dayroom  Group Description: Meditation. LRT and patients discussed what they know about meditation and mindfulness. LRT played a Deep Breathing Meditation exercise script for patients to follow along to. LRT and patients discussed how meditation and deep breathing can be used as a coping skill post--discharge to help manage symptoms of stress.   Goal Area(s) Addressed: Patient will practice using relaxation technique. Patient will identify a new coping skill.  Patient will follow multistep directions to reduce anxiety and stress.   Affect/Mood: N/A   Participation Level: Minimal    Clinical Observations/Individualized Feedback: Heather Kramer was originally present in group. Pt was noted to be talking to herself about black and milds and shared I guess I can't talk trash on the internet anymore. Pt left group early and did not return.   Plan: Continue to engage patient in RT group sessions 2-3x/week.   Jeoffrey BRAVO Celestia, LRT, CTRS 02/17/2024 11:44 AM
